# Patient Record
Sex: Female | Born: 1955
Health system: Southern US, Community
[De-identification: ages and names within clinical notes are randomized; demographics above are authoritative.]

## PROBLEM LIST (undated history)

## (undated) DIAGNOSIS — I10 Essential (primary) hypertension: Secondary | ICD-10-CM

## (undated) DIAGNOSIS — K8020 Calculus of gallbladder without cholecystitis without obstruction: Secondary | ICD-10-CM

## (undated) DIAGNOSIS — K621 Rectal polyp: Secondary | ICD-10-CM

## (undated) DIAGNOSIS — E039 Hypothyroidism, unspecified: Secondary | ICD-10-CM

## (undated) DIAGNOSIS — R911 Solitary pulmonary nodule: Secondary | ICD-10-CM

## (undated) DIAGNOSIS — E669 Obesity, unspecified: Secondary | ICD-10-CM

## (undated) DIAGNOSIS — M545 Low back pain, unspecified: Secondary | ICD-10-CM

## (undated) DIAGNOSIS — E559 Vitamin D deficiency, unspecified: Secondary | ICD-10-CM

## (undated) DIAGNOSIS — Z87442 Personal history of urinary calculi: Secondary | ICD-10-CM

## (undated) DIAGNOSIS — F419 Anxiety disorder, unspecified: Secondary | ICD-10-CM

## (undated) DIAGNOSIS — N200 Calculus of kidney: Secondary | ICD-10-CM

## (undated) HISTORY — DX: Essential (primary) hypertension: I10

## (undated) HISTORY — PX: DILATION AND CURETTAGE OF UTERUS: SHX78

## (undated) HISTORY — DX: Calculus of gallbladder without cholecystitis without obstruction: K80.20

## (undated) HISTORY — DX: Low back pain: M54.5

## (undated) HISTORY — DX: Obesity, unspecified: E66.9

## (undated) HISTORY — DX: Solitary pulmonary nodule: R91.1

## (undated) HISTORY — PX: APPENDECTOMY: SHX54

## (undated) HISTORY — DX: Low back pain, unspecified: M54.50

## (undated) HISTORY — PX: LIPOMA EXCISION: SHX5283

## (undated) HISTORY — DX: Rectal polyp: K62.1

## (undated) HISTORY — DX: Vitamin D deficiency, unspecified: E55.9

## (undated) HISTORY — DX: Anxiety disorder, unspecified: F41.9

## (undated) HISTORY — DX: Hypothyroidism, unspecified: E03.9

## (undated) HISTORY — DX: Calculus of kidney: N20.0

---

## 2007-12-02 ENCOUNTER — Ambulatory Visit: Payer: Self-pay | Admitting: Internal Medicine

## 2007-12-27 ENCOUNTER — Ambulatory Visit: Payer: Self-pay | Admitting: Internal Medicine

## 2008-03-11 ENCOUNTER — Ambulatory Visit: Payer: Self-pay | Admitting: Internal Medicine

## 2008-03-20 ENCOUNTER — Ambulatory Visit: Payer: Self-pay | Admitting: Gastroenterology

## 2009-02-23 LAB — HM COLONOSCOPY

## 2010-08-14 ENCOUNTER — Other Ambulatory Visit: Payer: Self-pay

## 2010-10-27 ENCOUNTER — Other Ambulatory Visit: Payer: Self-pay | Admitting: Internal Medicine

## 2010-11-11 ENCOUNTER — Ambulatory Visit: Payer: Self-pay | Admitting: Internal Medicine

## 2010-11-23 ENCOUNTER — Other Ambulatory Visit: Payer: Self-pay | Admitting: Internal Medicine

## 2010-12-23 ENCOUNTER — Other Ambulatory Visit: Payer: Self-pay | Admitting: Internal Medicine

## 2011-01-25 ENCOUNTER — Other Ambulatory Visit: Payer: Self-pay | Admitting: Internal Medicine

## 2011-03-19 ENCOUNTER — Ambulatory Visit: Payer: Self-pay | Admitting: Internal Medicine

## 2011-11-01 ENCOUNTER — Ambulatory Visit: Payer: Self-pay | Admitting: Internal Medicine

## 2011-11-19 ENCOUNTER — Ambulatory Visit (INDEPENDENT_AMBULATORY_CARE_PROVIDER_SITE_OTHER): Payer: BC Managed Care – PPO | Admitting: Internal Medicine

## 2011-11-19 ENCOUNTER — Encounter: Payer: Self-pay | Admitting: Internal Medicine

## 2011-11-19 VITALS — BP 136/96 | HR 81 | Temp 98.0°F | Wt 238.0 lb

## 2011-11-19 DIAGNOSIS — E039 Hypothyroidism, unspecified: Secondary | ICD-10-CM

## 2011-11-19 DIAGNOSIS — F419 Anxiety disorder, unspecified: Secondary | ICD-10-CM

## 2011-11-19 DIAGNOSIS — R05 Cough: Secondary | ICD-10-CM

## 2011-11-19 DIAGNOSIS — I10 Essential (primary) hypertension: Secondary | ICD-10-CM | POA: Insufficient documentation

## 2011-11-19 DIAGNOSIS — H6690 Otitis media, unspecified, unspecified ear: Secondary | ICD-10-CM

## 2011-11-19 DIAGNOSIS — H6692 Otitis media, unspecified, left ear: Secondary | ICD-10-CM

## 2011-11-19 DIAGNOSIS — F411 Generalized anxiety disorder: Secondary | ICD-10-CM

## 2011-11-19 DIAGNOSIS — R059 Cough, unspecified: Secondary | ICD-10-CM

## 2011-11-19 MED ORDER — ALPRAZOLAM 0.25 MG PO TABS: 0.2500 mg | ORAL_TABLET | Freq: Two times a day (BID) | ORAL | Status: DC | PRN

## 2011-11-19 MED ORDER — GUAIFENESIN-CODEINE 100-10 MG/5ML PO SYRP: 5.0000 mL | ORAL_SOLUTION | Freq: Two times a day (BID) | ORAL | Status: DC | PRN

## 2011-11-19 MED ORDER — LEVOTHYROXINE SODIUM 150 MCG PO TABS: 150.0000 ug | ORAL_TABLET | Freq: Every day | ORAL | Status: DC

## 2011-11-19 MED ORDER — LOSARTAN POTASSIUM-HCTZ 50-12.5 MG PO TABS: 1.0000 | ORAL_TABLET | Freq: Every day | ORAL | Status: DC

## 2011-11-19 MED ORDER — AMOXICILLIN-POT CLAVULANATE 875-125 MG PO TABS: 1.0000 | ORAL_TABLET | Freq: Two times a day (BID) | ORAL | Status: AC

## 2011-11-19 NOTE — Progress Notes (Signed)
Subjective:    Patient ID: Barbara Blake, female    DOB: 10/19/1956, 55 y.o.   MRN: [id number]  HPI 55 year old female with a history of hypertension, hypothyroidism, and obesity presents for followup. Her primary concern today is an approximate 2 week history of nasal congestion, fatigue, cough, and ear pain. She notes that she cares for her grandchildren who are frequently sick. She reports that she has been taking over-the-counter DayQuil and NyQuil to help control her symptoms. She denies any fever but has had occasional chills.  She also notes that over the last several months she has been very fatigued. She notes that she was promoted in her job and is working extremely long hours. When she gets home from work, she takes care of her grandchildren. She reports being exhausted. She reports that her new job carries much more responsibility and leads to increased anxiety.  In regards to her hypothyroidism, she reports full compliance with her medication. She has noted some recent weight gain, however she attributes this to lack of exercise and poor dietary choices.  In regards to her hypertension, she reports full compliance with her medicine. She denies any chest pain, palpitations, headache.   Review of Systems  Constitutional: Positive for fatigue. Negative for fever, chills, appetite change and unexpected weight change.  HENT: Positive for congestion, rhinorrhea, postnasal drip and sinus pressure. Negative for sore throat, trouble swallowing, neck pain and voice change.   Eyes: Negative for visual disturbance.  Respiratory: Positive for cough. Negative for shortness of breath, wheezing and stridor.   Cardiovascular: Negative for chest pain, palpitations and leg swelling.  Gastrointestinal: Negative for nausea, vomiting, abdominal pain, diarrhea, constipation, blood in stool, abdominal distention and anal bleeding.  Genitourinary: Negative for dysuria and flank pain.  Musculoskeletal:  Negative for myalgias, arthralgias and gait problem.  Skin: Negative for color change and rash.  Neurological: Negative for dizziness and headaches.  Hematological: Negative for adenopathy. Does not bruise/bleed easily.  Psychiatric/Behavioral: Negative for suicidal ideas, sleep disturbance and dysphoric mood. The patient is not nervous/anxious.    BP 136/96  Pulse 81  Temp(Src) 98 F (36.7 C) (Oral)  Wt 238 lb (107.956 kg)  SpO2 96%     Objective:   Physical Exam  Constitutional: She is oriented to person, place, and time. She appears well-developed and well-nourished. No distress.  HENT:  Head: Normocephalic and atraumatic.  Right Ear: External ear normal. Tympanic membrane is not erythematous. A middle ear effusion is present.  Left Ear: External ear normal. Tympanic membrane is erythematous. A middle ear effusion is present.  Nose: Nose normal.  Mouth/Throat: Oropharynx is clear and moist. No oropharyngeal exudate.  Eyes: Conjunctivae are normal. Pupils are equal, round, and reactive to light. Right eye exhibits no discharge. Left eye exhibits no discharge. No scleral icterus.  Neck: Normal range of motion. Neck supple. No tracheal deviation present. No thyromegaly present.  Cardiovascular: Normal rate, regular rhythm, normal heart sounds and intact distal pulses.  Exam reveals no gallop and no friction rub.   No murmur heard. Pulmonary/Chest: Effort normal and breath sounds normal. No respiratory distress. She has no wheezes. She has no rales. She exhibits no tenderness.  Musculoskeletal: Normal range of motion. She exhibits no edema and no tenderness.  Lymphadenopathy:    She has no cervical adenopathy.  Neurological: She is alert and oriented to person, place, and time. No cranial nerve deficit. She exhibits normal muscle tone. Coordination normal.  Skin: Skin is warm and dry. No  rash noted. She is not diaphoretic. No erythema. No pallor.  Psychiatric: She has a normal mood  and affect. Her behavior is normal. Judgment and thought content normal.          Assessment & Plan:  1. Left otitis media - exam is consistent with left otitis media. Will treat with a ten-day course of Augmentin. She will continue to use DayQuil and NyQuil as needed to control symptoms. She will use ibuprofen as needed for pain. She will followup in one month. She will come back sooner if symptoms are not improving.  2. Hypertension - blood pressure is well-controlled on current medication. We'll check renal function with labs. She will followup in one month.  3. Hypothyroidism -will continue Synthroid. Will check TSH with labs.  4. Fatigue - secondary to work schedule and increased responsibilities at home. Encouraged her to take time for herself. Will followup in one month.

## 2011-11-20 ENCOUNTER — Other Ambulatory Visit: Payer: Self-pay | Admitting: Internal Medicine

## 2011-11-23 ENCOUNTER — Encounter: Payer: Self-pay | Admitting: *Deleted

## 2011-12-24 ENCOUNTER — Ambulatory Visit: Payer: BC Managed Care – PPO | Admitting: Internal Medicine

## 2012-02-16 ENCOUNTER — Encounter: Payer: Self-pay | Admitting: Internal Medicine

## 2012-03-28 ENCOUNTER — Telehealth: Payer: Self-pay | Admitting: Internal Medicine

## 2012-03-28 NOTE — Telephone Encounter (Signed)
[phone] Pt wanted to get lab results from dec. Pt has appointment 4/17 @ 2:30

## 2012-03-29 ENCOUNTER — Ambulatory Visit: Payer: BC Managed Care – PPO | Admitting: Internal Medicine

## 2012-03-29 NOTE — Telephone Encounter (Signed)
Left VM for pt to call office back. Unsure when and where she had her labs.

## 2012-03-29 NOTE — Telephone Encounter (Signed)
I spoke w/pt. She states that she had her labs at [facility] in December. I called the lab and they will be faxing over results so we can have them for the pt's apt today.

## 2012-04-05 ENCOUNTER — Encounter: Payer: Self-pay | Admitting: Internal Medicine

## 2012-04-19 ENCOUNTER — Ambulatory Visit: Payer: BC Managed Care – PPO | Admitting: Internal Medicine

## 2012-04-24 ENCOUNTER — Encounter: Payer: Self-pay | Admitting: Internal Medicine

## 2012-04-24 ENCOUNTER — Other Ambulatory Visit: Payer: Self-pay | Admitting: Internal Medicine

## 2012-04-24 ENCOUNTER — Ambulatory Visit (INDEPENDENT_AMBULATORY_CARE_PROVIDER_SITE_OTHER): Payer: BC Managed Care – PPO | Admitting: Internal Medicine

## 2012-04-24 VITALS — BP 167/96 | HR 70 | Temp 97.8°F | Resp 16 | Wt 239.2 lb

## 2012-04-24 DIAGNOSIS — I10 Essential (primary) hypertension: Secondary | ICD-10-CM

## 2012-04-24 DIAGNOSIS — H6690 Otitis media, unspecified, unspecified ear: Secondary | ICD-10-CM

## 2012-04-24 DIAGNOSIS — M25519 Pain in unspecified shoulder: Secondary | ICD-10-CM

## 2012-04-24 DIAGNOSIS — H6691 Otitis media, unspecified, right ear: Secondary | ICD-10-CM

## 2012-04-24 DIAGNOSIS — Z1239 Encounter for other screening for malignant neoplasm of breast: Secondary | ICD-10-CM

## 2012-04-24 DIAGNOSIS — E039 Hypothyroidism, unspecified: Secondary | ICD-10-CM

## 2012-04-24 MED ORDER — LEVOFLOXACIN 750 MG PO TABS: 750.0000 mg | ORAL_TABLET | Freq: Every day | ORAL | Status: AC

## 2012-04-24 NOTE — Assessment & Plan Note (Signed)
Blood pressure is markedly elevated today, however patient is in significant pain secondary to right ear infection. Will continue current medications. Will check renal function with labs. We'll have her followup in 2-3 weeks for recheck.

## 2012-04-24 NOTE — Progress Notes (Signed)
Subjective:    Patient ID: Barbara Blake, female    DOB: 06/05/1956, 56 y.o.   MRN: [id number]  HPI 56 year old female with history of hypothyroidism and obesity presents for followup. She has several concerns today. First, she notes that approximately 2-3 weeks ago she had an episode of severe shoulder and upper arm pain bilaterally. This is described as a diffuse muscle ache or burning pain. There was no noted injury. The pain resolved after several days without any intervention.  She also reports recent history of right sided ear infection. She reports that she was diagnosed with both internal and external ear infections. She has been treated with oral amoxicillin and topical Ciprodex. She reports that her urine is very painful. She also reports some pain across her right temple and right jaw. She has not had fever or chills. She has not had nasal congestion. She has not recently been swimming.  Outpatient Encounter Prescriptions as of 04/24/2012  Medication Sig Dispense Refill  . ALPRAZolam (XANAX) 0.25 MG tablet Take 1 tablet (0.25 mg total) by mouth 2 (two) times daily as needed.  180 tablet  1  . Calcium Carbonate-Vitamin D (CALCIUM + D PO) Take by mouth daily.        . ciprofloxacin-dexamethasone (CIPRODEX) otic suspension Place 4 drops in ear(s) 2 (two) times daily.      Marland Kitchen guaiFENesin-codeine (ROBITUSSIN AC) 100-10 MG/5ML syrup Take 5 mLs by mouth 2 (two) times daily as needed for cough.  240 mL  0  . levothyroxine (SYNTHROID, LEVOTHROID) 150 MCG tablet Take 1 tablet (150 mcg total) by mouth daily.  90 tablet  4  . losartan-hydrochlorothiazide (HYZAAR) 50-12.5 MG per tablet Take 1 tablet by mouth daily.  90 tablet  4  . Multiple Vitamins-Minerals (MULTIVITAMIN WITH MINERALS) tablet Take 1 tablet by mouth daily.        Marland Kitchen DISCONTD: amoxicillin (AMOXIL) 500 MG capsule Take 1,000 mg by mouth 2 (two) times daily.      Marland Kitchen levofloxacin (LEVAQUIN) 750 MG tablet Take 1 tablet (750 mg total) by mouth  daily.  10 tablet  0   BP 167/96  Pulse 70  Temp(Src) 97.8 F (36.6 C) (Oral)  Resp 16  Wt 239 lb 4 oz (108.523 kg)  SpO2 98%  Review of Systems  Constitutional: Negative for fever, chills, appetite change, fatigue and unexpected weight change.  HENT: Positive for ear pain and ear discharge. Negative for hearing loss, congestion, sore throat, trouble swallowing, neck pain, voice change and sinus pressure.   Eyes: Negative for visual disturbance.  Respiratory: Negative for cough, shortness of breath, wheezing and stridor.   Cardiovascular: Negative for chest pain, palpitations and leg swelling.  Gastrointestinal: Negative for nausea, vomiting, abdominal pain, diarrhea, constipation, blood in stool, abdominal distention and anal bleeding.  Genitourinary: Negative for dysuria and flank pain.  Musculoskeletal: Positive for myalgias and arthralgias. Negative for gait problem.  Skin: Negative for color change and rash.  Neurological: Negative for dizziness and headaches.  Hematological: Negative for adenopathy. Does not bruise/bleed easily.  Psychiatric/Behavioral: Negative for suicidal ideas, sleep disturbance and dysphoric mood. The patient is not nervous/anxious.        Objective:   Physical Exam  Constitutional: She is oriented to person, place, and time. She appears well-developed and well-nourished. No distress.  HENT:  Head: Normocephalic and atraumatic.  Nose: Nose normal.  Mouth/Throat: Oropharynx is clear and moist. No oropharyngeal exudate.       Unable to visualize right TM because  of edema in right ear canal with mild erythema and purulent exudate.  Eyes: Conjunctivae are normal. Pupils are equal, round, and reactive to light. Right eye exhibits no discharge. Left eye exhibits no discharge. No scleral icterus.  Neck: Normal range of motion. Neck supple. No tracheal deviation present. No thyromegaly present.  Cardiovascular: Normal rate, regular rhythm, normal heart sounds  and intact distal pulses.  Exam reveals no gallop and no friction rub.   No murmur heard. Pulmonary/Chest: Effort normal and breath sounds normal. No respiratory distress. She has no wheezes. She has no rales. She exhibits no tenderness.  Musculoskeletal: Normal range of motion. She exhibits no edema and no tenderness.  Lymphadenopathy:    She has no cervical adenopathy.  Neurological: She is alert and oriented to person, place, and time. No cranial nerve deficit. She exhibits normal muscle tone. Coordination normal.  Skin: Skin is warm and dry. No rash noted. She is not diaphoretic. No erythema. No pallor.  Psychiatric: She has a normal mood and affect. Her behavior is normal. Judgment and thought content normal.          Assessment & Plan:

## 2012-04-24 NOTE — Assessment & Plan Note (Signed)
Patient appears to have a right otitis externa and possibly right otitis media. Will change amoxicillin to Levaquin. We'll continue Ciprodex. Will set up referral to ENT for better visualization of the ear canal and tympanic membrane.

## 2012-04-24 NOTE — Assessment & Plan Note (Signed)
Patient's description of bilateral shoulder pain seems most consistent with polymyalgia rheumatica. Question if she may now have temporal arteritis that she has had some episodes of blurred vision and right temporal pain. Will check CBC, ESR, CRP, CMP, ANA with labs. Will continue to monitor for recurrence of pain. Exam is normal today. Followup in 3 weeks.

## 2012-04-24 NOTE — Assessment & Plan Note (Signed)
Thyroid function from December 2012 was normal. Will continue Synthroid at current dose. Plan to repeat TSH once yearly.

## 2012-04-25 LAB — C-REACTIVE PROTEIN: CRP: 0.99 mg/dL — ABNORMAL HIGH (ref <0.60)

## 2012-04-25 LAB — COMPREHENSIVE METABOLIC PANEL
Albumin: 4 g/dL (ref 3.5–5.2)
Alkaline Phosphatase: 91 U/L (ref 39–117)
BUN: 13 mg/dL (ref 6–23)
Calcium: 10.3 mg/dL (ref 8.4–10.5)
Glucose, Bld: 106 mg/dL — ABNORMAL HIGH (ref 70–99)
Potassium: 4.3 mEq/L (ref 3.5–5.3)

## 2012-04-25 LAB — ANA: Anti Nuclear Antibody(ANA): NEGATIVE

## 2012-04-25 NOTE — Progress Notes (Signed)
Addended by: Melody Comas L on: 04/25/2012 10:50 AM   Modules accepted: Orders

## 2012-04-25 NOTE — Progress Notes (Signed)
Addended by: Melody Comas L on: 04/25/2012 11:50 AM   Modules accepted: Orders

## 2012-04-26 ENCOUNTER — Other Ambulatory Visit: Payer: Self-pay | Admitting: Internal Medicine

## 2012-04-26 DIAGNOSIS — I10 Essential (primary) hypertension: Secondary | ICD-10-CM

## 2012-04-26 LAB — CBC WITH DIFFERENTIAL/PLATELET
Basophils Relative: 1 % (ref 0–1)
Eosinophils Absolute: 0.3 10*3/uL (ref 0.0–0.7)
Lymphocytes Relative: 38 % (ref 12–46)
Lymphs Abs: 2.9 10*3/uL (ref 0.7–4.0)
MCV: 92.3 fL (ref 78.0–100.0)
Neutrophils Relative %: 51 % (ref 43–77)
Platelets: 256 10*3/uL (ref 150–400)
RBC: 4.8 MIL/uL (ref 3.87–5.11)
RDW: 13.7 % (ref 11.5–15.5)
WBC: 7.6 10*3/uL (ref 4.0–10.5)

## 2012-04-26 LAB — SEDIMENTATION RATE

## 2012-05-04 ENCOUNTER — Ambulatory Visit: Payer: Self-pay | Admitting: Internal Medicine

## 2012-05-12 ENCOUNTER — Ambulatory Visit: Payer: BC Managed Care – PPO | Admitting: Internal Medicine

## 2012-05-17 ENCOUNTER — Encounter: Payer: Self-pay | Admitting: Internal Medicine

## 2012-05-18 ENCOUNTER — Encounter: Payer: Self-pay | Admitting: Internal Medicine

## 2012-05-19 ENCOUNTER — Encounter: Payer: Self-pay | Admitting: Internal Medicine

## 2012-05-19 ENCOUNTER — Ambulatory Visit (INDEPENDENT_AMBULATORY_CARE_PROVIDER_SITE_OTHER): Payer: BC Managed Care – PPO | Admitting: Internal Medicine

## 2012-05-19 VITALS — BP 122/86 | HR 75 | Temp 98.2°F | Resp 16 | Wt 236.2 lb

## 2012-05-19 DIAGNOSIS — I10 Essential (primary) hypertension: Secondary | ICD-10-CM

## 2012-05-19 DIAGNOSIS — M25519 Pain in unspecified shoulder: Secondary | ICD-10-CM

## 2012-05-19 DIAGNOSIS — M2550 Pain in unspecified joint: Secondary | ICD-10-CM

## 2012-05-19 NOTE — Assessment & Plan Note (Signed)
Patient describes history of arthralgia and occasional swelling in her hands and arms. Swelling is worse in the morning and improves in the afternoon. Suggestive of rheumatoid arthritis. Recent laboratory evaluation showed a slightly elevated CRP, but normal ESR and negative ANA. Will set up evaluation with rheumatology. She will call back with name of provider that she would like to see.

## 2012-05-19 NOTE — Assessment & Plan Note (Signed)
Symptoms have now resolved. Will continue to monitor.

## 2012-05-19 NOTE — Progress Notes (Signed)
Subjective:    Patient ID: Barbara Blake, female    DOB: Apr 08, 1956, 56 y.o.   MRN: [id number]  HPI 56 year old female with history of obesity, hypertension, and recent episode of right shoulder pain presents for followup. She reports that her shoulder pain has improved. However, she reports diffuse arthralgias in her hands and arms. She reports that this is typically worse in the morning and improves throughout the day. However, she did have an episode where she spent extensive time cleaning her back porch and then subsequently had worsening swelling and pain in her fingers and arms. This pain and swelling resolved with the use of ibuprofen. We reviewed recent labs which showed slightly elevated CRP, but normal ESR and ANA.  In regards to her hypertension, she reports compliance with her medications. She has not been regularly checking her blood pressure. She denies any chest pain, headache, palpitation.  Outpatient Encounter Prescriptions as of 05/19/2012  Medication Sig Dispense Refill  . ALPRAZolam (XANAX) 0.25 MG tablet Take 1 tablet (0.25 mg total) by mouth 2 (two) times daily as needed.  180 tablet  1  . Calcium Carbonate-Vitamin D (CALCIUM + D PO) Take by mouth daily.        Marland Kitchen levothyroxine (SYNTHROID, LEVOTHROID) 150 MCG tablet Take 1 tablet (150 mcg total) by mouth daily.  90 tablet  4  . losartan-hydrochlorothiazide (HYZAAR) 50-12.5 MG per tablet Take 1 tablet by mouth daily.  90 tablet  4  . Multiple Vitamins-Minerals (MULTIVITAMIN WITH MINERALS) tablet Take 1 tablet by mouth daily.          Review of Systems  Constitutional: Negative for fever, chills, appetite change, fatigue and unexpected weight change.  HENT: Negative for neck pain.   Eyes: Negative for visual disturbance.  Respiratory: Negative for cough, shortness of breath, wheezing and stridor.   Cardiovascular: Negative for chest pain, palpitations and leg swelling.  Gastrointestinal: Negative for abdominal pain.    Genitourinary: Negative for dysuria and flank pain.  Musculoskeletal: Positive for myalgias, joint swelling and arthralgias. Negative for gait problem.  Skin: Negative for color change and rash.  Neurological: Negative for dizziness and headaches.  Hematological: Negative for adenopathy. Does not bruise/bleed easily.  Psychiatric/Behavioral: Negative for suicidal ideas, sleep disturbance and dysphoric mood. The patient is not nervous/anxious.    BP 122/86  Pulse 75  Temp(Src) 98.2 F (36.8 C) (Oral)  Resp 16  Wt 236 lb 4 oz (107.162 kg)  SpO2 97%     Objective:   Physical Exam  Constitutional: She is oriented to person, place, and time. She appears well-developed and well-nourished. No distress.  HENT:  Head: Normocephalic and atraumatic.  Right Ear: External ear normal.  Left Ear: External ear normal.  Nose: Nose normal.  Mouth/Throat: Oropharynx is clear and moist. No oropharyngeal exudate.  Eyes: Conjunctivae are normal. Pupils are equal, round, and reactive to light. Right eye exhibits no discharge. Left eye exhibits no discharge. No scleral icterus.  Neck: Normal range of motion. Neck supple. No tracheal deviation present. No thyromegaly present.  Cardiovascular: Normal rate, regular rhythm, normal heart sounds and intact distal pulses.  Exam reveals no gallop and no friction rub.   No murmur heard. Pulmonary/Chest: Effort normal and breath sounds normal. No respiratory distress. She has no wheezes. She has no rales. She exhibits no tenderness.  Musculoskeletal: Normal range of motion. She exhibits no edema and no tenderness.  Lymphadenopathy:    She has no cervical adenopathy.  Neurological: She is alert and  oriented to person, place, and time. No cranial nerve deficit. She exhibits normal muscle tone. Coordination normal.  Skin: Skin is warm and dry. No rash noted. She is not diaphoretic. No erythema. No pallor.  Psychiatric: She has a normal mood and affect. Her behavior  is normal. Judgment and thought content normal.          Assessment & Plan:

## 2012-05-19 NOTE — Assessment & Plan Note (Signed)
Blood pressure markedly improved today. Will continue to monitor.

## 2012-07-24 ENCOUNTER — Encounter: Payer: BC Managed Care – PPO | Admitting: Internal Medicine

## 2013-02-20 ENCOUNTER — Encounter: Payer: BC Managed Care – PPO | Admitting: Internal Medicine

## 2013-02-23 ENCOUNTER — Encounter: Payer: Self-pay | Admitting: Internal Medicine

## 2013-02-23 ENCOUNTER — Ambulatory Visit (INDEPENDENT_AMBULATORY_CARE_PROVIDER_SITE_OTHER): Payer: BC Managed Care – PPO | Admitting: Internal Medicine

## 2013-02-23 ENCOUNTER — Encounter: Payer: BC Managed Care – PPO | Admitting: Internal Medicine

## 2013-02-23 VITALS — BP 144/90 | HR 75 | Temp 98.0°F | Ht 61.25 in | Wt 238.0 lb

## 2013-02-23 DIAGNOSIS — M25561 Pain in right knee: Secondary | ICD-10-CM | POA: Insufficient documentation

## 2013-02-23 DIAGNOSIS — R053 Chronic cough: Secondary | ICD-10-CM | POA: Insufficient documentation

## 2013-02-23 DIAGNOSIS — R05 Cough: Secondary | ICD-10-CM

## 2013-02-23 DIAGNOSIS — R059 Cough, unspecified: Secondary | ICD-10-CM

## 2013-02-23 DIAGNOSIS — D1720 Benign lipomatous neoplasm of skin and subcutaneous tissue of unspecified limb: Secondary | ICD-10-CM

## 2013-02-23 DIAGNOSIS — F411 Generalized anxiety disorder: Secondary | ICD-10-CM

## 2013-02-23 DIAGNOSIS — I10 Essential (primary) hypertension: Secondary | ICD-10-CM

## 2013-02-23 DIAGNOSIS — Z1211 Encounter for screening for malignant neoplasm of colon: Secondary | ICD-10-CM

## 2013-02-23 DIAGNOSIS — M25562 Pain in left knee: Secondary | ICD-10-CM

## 2013-02-23 DIAGNOSIS — F419 Anxiety disorder, unspecified: Secondary | ICD-10-CM

## 2013-02-23 DIAGNOSIS — E039 Hypothyroidism, unspecified: Secondary | ICD-10-CM

## 2013-02-23 DIAGNOSIS — M25569 Pain in unspecified knee: Secondary | ICD-10-CM

## 2013-02-23 DIAGNOSIS — D1779 Benign lipomatous neoplasm of other sites: Secondary | ICD-10-CM

## 2013-02-23 LAB — COMPREHENSIVE METABOLIC PANEL
BUN: 17 mg/dL (ref 6–23)
CO2: 25 mEq/L (ref 19–32)
Creat: 0.9 mg/dL (ref 0.50–1.10)
Glucose, Bld: 91 mg/dL (ref 70–99)
Total Bilirubin: 1.5 mg/dL — ABNORMAL HIGH (ref 0.3–1.2)

## 2013-02-23 LAB — CBC WITH DIFFERENTIAL/PLATELET
Basophils Absolute: 0.1 10*3/uL (ref 0.0–0.1)
Basophils Relative: 1 % (ref 0–1)
Eosinophils Relative: 1 % (ref 0–5)
HCT: 42.3 % (ref 36.0–46.0)
Hemoglobin: 14.6 g/dL (ref 12.0–15.0)
Lymphocytes Relative: 29 % (ref 12–46)
MCHC: 34.5 g/dL (ref 30.0–36.0)
MCV: 85.8 fL (ref 78.0–100.0)
Monocytes Absolute: 0.4 10*3/uL (ref 0.1–1.0)
Monocytes Relative: 6 % (ref 3–12)
RDW: 14.1 % (ref 11.5–15.5)

## 2013-02-23 LAB — LIPID PANEL
Cholesterol: 191 mg/dL (ref 0–200)
HDL: 52 mg/dL (ref >39)
Triglycerides: 108 mg/dL (ref <150)

## 2013-02-23 LAB — T4, FREE: Free T4: 1.59 ng/dL (ref 0.80–1.80)

## 2013-02-23 LAB — TSH: TSH: 0.534 u[IU]/mL (ref 0.350–4.500)

## 2013-02-23 MED ORDER — MELOXICAM 15 MG PO TABS: 15.0000 mg | ORAL_TABLET | Freq: Every day | ORAL | Status: DC

## 2013-02-23 MED ORDER — ALPRAZOLAM 0.25 MG PO TABS: 0.2500 mg | ORAL_TABLET | Freq: Two times a day (BID) | ORAL | Status: DC | PRN

## 2013-02-23 MED ORDER — LOSARTAN POTASSIUM-HCTZ 50-12.5 MG PO TABS: 1.0000 | ORAL_TABLET | Freq: Every day | ORAL | Status: DC

## 2013-02-23 MED ORDER — PANTOPRAZOLE SODIUM 40 MG PO TBEC: 40.0000 mg | DELAYED_RELEASE_TABLET | Freq: Every day | ORAL | Status: DC

## 2013-02-23 MED ORDER — LEVOTHYROXINE SODIUM 150 MCG PO TABS: 150.0000 ug | ORAL_TABLET | Freq: Every day | ORAL | Status: DC

## 2013-02-23 NOTE — Assessment & Plan Note (Signed)
Exam consistent with lipomas bilateral anterior thighs. Discussed referral to surgery for evaluation and resection, however pt would like to hold off for now.

## 2013-02-23 NOTE — Assessment & Plan Note (Signed)
Bilateral knee pain is most consistent with osteoarthritis likely exacerbated by obesity. We discussed setting up orthopedic evaluation and getting plain films of her knees. Will proceed with plain film evaluation to her work. Depending on results, plan to set up evaluation with ortho, Dr. Martha Clan. Will start meloxicam 15mg  daily. Follow up 4 weeks.

## 2013-02-23 NOTE — Assessment & Plan Note (Signed)
BP Readings from Last 3 Encounters:  02/23/13 144/90  05/19/12 122/86  04/24/12 167/96   Blood pressure slightly elevated today but patient reports has been well controlled when checked at her work. Will continue losartan-HCTZ. Will check renal function and urine micro-albumen with labs today.

## 2013-02-23 NOTE — Assessment & Plan Note (Signed)
Will check TSH and T4 with labs today. Continue Synthroid.

## 2013-02-23 NOTE — Assessment & Plan Note (Signed)
Chronic cough over last several months. Exam normal today. Symptoms are most consistent with acid reflux. Will set up ENT evaluation for tract visualization of vocal cords and posterior pharynx. Will start pantoprazole. Followup 4 weeks.

## 2013-02-23 NOTE — Progress Notes (Signed)
Subjective:    Patient ID: Barbara Blake, female    DOB: Jan 13, 1956, 57 y.o.   MRN: [id number]  HPI 57 year old female with history of hypothyroidism, hypertension, obesity presents for followup. She has several concerns today.  First, she notes a 3 to four-month history of intermittent dry cough. This occurs every day throughout the day. She denies any nasal drainage, sneezing, shortness of breath, chest pain, fever or chills. She is not a smoker. She has tried using cough drops and over-the-counter cough suppressants with no improvement.  Second, she is concerned about gradually worsening bilateral knee pain. Pain is described as aching it is made worse with prolonged standing. Her husband often notes cracking sounds from her knees. She occasionally has swelling in her knees. She has taken over-the-counter medications such as ibuprofen with no improvement.  She is also concerned today about bilateral subcutaneous nodules over her thighs. She reports she has a history of multiple lipomas in the past and is status post resection of a lipoma in her back. The lesions on her thighs are occasionally painful. Did not seem to be changing in size.  Outpatient Encounter Prescriptions as of 02/23/2013  Medication Sig Dispense Refill  . ALPRAZolam (XANAX) 0.25 MG tablet Take 1 tablet (0.25 mg total) by mouth 2 (two) times daily as needed.  180 tablet  1  . Calcium Carbonate-Vitamin D (CALCIUM + D PO) Take by mouth daily.        Marland Kitchen levothyroxine (SYNTHROID, LEVOTHROID) 150 MCG tablet Take 1 tablet (150 mcg total) by mouth daily.  90 tablet  4  . losartan-hydrochlorothiazide (HYZAAR) 50-12.5 MG per tablet Take 1 tablet by mouth daily.  90 tablet  4  . Multiple Vitamins-Minerals (MULTIVITAMIN WITH MINERALS) tablet Take 1 tablet by mouth daily.        . meloxicam (MOBIC) 15 MG tablet Take 1 tablet (15 mg total) by mouth daily.  30 tablet  0  . pantoprazole (PROTONIX) 40 MG tablet Take 1 tablet (40 mg total) by  mouth daily.  30 tablet  3   No facility-administered encounter medications on file as of 02/23/2013.   BP 144/90  Pulse 75  Temp(Src) 98 F (36.7 C) (Oral)  Ht 5' 1.25" (1.556 m)  Wt 238 lb (107.956 kg)  BMI 44.59 kg/m2  SpO2 96%  Review of Systems  Constitutional: Negative for fever, chills, appetite change, fatigue and unexpected weight change.  HENT: Negative for ear pain, congestion, sore throat, trouble swallowing, neck pain, voice change and sinus pressure.   Eyes: Negative for visual disturbance.  Respiratory: Positive for cough. Negative for shortness of breath, wheezing and stridor.   Cardiovascular: Negative for chest pain, palpitations and leg swelling.  Gastrointestinal: Negative for nausea, vomiting, abdominal pain, diarrhea, constipation, blood in stool, abdominal distention and anal bleeding.  Genitourinary: Negative for dysuria and flank pain.  Musculoskeletal: Positive for myalgias, joint swelling and arthralgias. Negative for gait problem.  Skin: Negative for color change and rash.  Neurological: Negative for dizziness and headaches.  Hematological: Negative for adenopathy. Does not bruise/bleed easily.  Psychiatric/Behavioral: Negative for suicidal ideas, sleep disturbance and dysphoric mood. The patient is not nervous/anxious.        Objective:   Physical Exam  Constitutional: She is oriented to person, place, and time. She appears well-developed and well-nourished. No distress.  HENT:  Head: Normocephalic and atraumatic.  Right Ear: External ear normal.  Left Ear: External ear normal.  Nose: Nose normal.  Mouth/Throat: Oropharynx is clear and  moist. No oropharyngeal exudate or posterior oropharyngeal erythema.  Eyes: Conjunctivae are normal. Pupils are equal, round, and reactive to light. Right eye exhibits no discharge. Left eye exhibits no discharge. No scleral icterus.  Neck: Normal range of motion. Neck supple. No tracheal deviation present. No  thyromegaly present.  Cardiovascular: Normal rate, regular rhythm, normal heart sounds and intact distal pulses.  Exam reveals no gallop and no friction rub.   No murmur heard. Pulmonary/Chest: Effort normal and breath sounds normal. No accessory muscle usage. Not tachypneic. No respiratory distress. She has no decreased breath sounds. She has no wheezes. She has no rhonchi. She has no rales. She exhibits no tenderness.  Musculoskeletal: Normal range of motion. She exhibits no edema and no tenderness.       Right knee: She exhibits normal range of motion and no swelling.       Left knee: She exhibits normal range of motion and no swelling.  Lymphadenopathy:    She has no cervical adenopathy.  Neurological: She is alert and oriented to person, place, and time. No cranial nerve deficit. She exhibits normal muscle tone. Coordination normal.  Skin: Skin is warm and dry. No rash noted. She is not diaphoretic. No erythema. No pallor.     Psychiatric: She has a normal mood and affect. Her behavior is normal. Judgment and thought content normal.          Assessment & Plan:

## 2013-02-23 NOTE — Assessment & Plan Note (Signed)
Colonoscopy scheduled.

## 2013-02-23 NOTE — Assessment & Plan Note (Signed)
Symptoms well controlled with alprazolam. We'll continue. 

## 2013-02-24 LAB — MICROALBUMIN / CREATININE URINE RATIO: Microalb Creat Ratio: 8.2 mg/g (ref 0.0–30.0)

## 2013-03-05 ENCOUNTER — Telehealth: Payer: Self-pay | Admitting: *Deleted

## 2013-03-05 DIAGNOSIS — R7989 Other specified abnormal findings of blood chemistry: Secondary | ICD-10-CM

## 2013-03-05 NOTE — Telephone Encounter (Signed)
Amber, Can you set this up? 

## 2013-03-05 NOTE — Telephone Encounter (Signed)
Message copied by Theola Sequin on Mon Mar 05, 2013  4:28 PM ------      Message from: Ronna Polio A      Created: Sun Feb 25, 2013  5:36 PM       Labs were normal except for bilirubin which is elevated. I would like to get [place] of liver/pancreas for further evaluation. ------

## 2013-03-05 NOTE — Telephone Encounter (Signed)
Patient is ok with referral

## 2013-03-09 ENCOUNTER — Ambulatory Visit: Payer: Self-pay | Admitting: Internal Medicine

## 2013-03-12 ENCOUNTER — Telehealth: Payer: Self-pay | Admitting: Internal Medicine

## 2013-03-12 NOTE — Telephone Encounter (Signed)
Left message to call back  

## 2013-03-12 NOTE — Telephone Encounter (Signed)
US of the right upper abdomen showed stones in the gallbladder. I would like to set up general surgical evaluation for resection of the gallbladder. Please ask her if she is okay with this and who she would like to see.

## 2013-03-13 NOTE — Telephone Encounter (Signed)
Called and spoke with patient, she was informed of the results but at this time she would like to hold off on the referral. Patient state she is in the middle of changing jobs so will call back at a later date when she is ready for referral.

## 2013-03-13 NOTE — Telephone Encounter (Signed)
Left message to call back on both home and cell phone 

## 2013-03-28 ENCOUNTER — Encounter: Payer: Self-pay | Admitting: Internal Medicine

## 2013-04-20 ENCOUNTER — Encounter: Payer: BC Managed Care – PPO | Admitting: Internal Medicine

## 2013-06-22 ENCOUNTER — Telehealth: Payer: Self-pay | Admitting: Internal Medicine

## 2013-06-22 DIAGNOSIS — E039 Hypothyroidism, unspecified: Secondary | ICD-10-CM

## 2013-06-22 MED ORDER — LEVOTHYROXINE SODIUM 150 MCG PO TABS: 150.0000 ug | ORAL_TABLET | Freq: Every day | ORAL | Status: DC

## 2013-06-22 NOTE — Telephone Encounter (Signed)
Ok to refill medication 

## 2013-06-22 NOTE — Telephone Encounter (Signed)
Rx sent to pharmacy   

## 2013-06-22 NOTE — Addendum Note (Signed)
Addended by: Theola Sequin on: 06/22/2013 01:46 PM   Modules accepted: Orders

## 2013-06-22 NOTE — Telephone Encounter (Signed)
Cancelled last visit with Dr. Dan Humphreys but needs refill for Synthroid 150.  Ran out 4 days ago and needs refill.  Not going to be able to sched appt just yet because just started a new job.  Really needs the refill.  [facility] [address]

## 2013-06-22 NOTE — Telephone Encounter (Signed)
Fine to refill Synthroid daily #90 with 1 refill.

## 2014-01-14 ENCOUNTER — Telehealth: Payer: Self-pay | Admitting: Internal Medicine

## 2014-01-14 DIAGNOSIS — F419 Anxiety disorder, unspecified: Secondary | ICD-10-CM

## 2014-01-14 DIAGNOSIS — I10 Essential (primary) hypertension: Secondary | ICD-10-CM

## 2014-01-14 NOTE — Telephone Encounter (Signed)
Pt called stated she needs to get her rx refilled through [facility]  50-12.5  xanax 0.25   Pt has appointment for cpx 4/7

## 2014-01-18 MED ORDER — ALPRAZOLAM 0.25 MG PO TABS: 0.2500 mg | ORAL_TABLET | Freq: Two times a day (BID) | ORAL | Status: DC | PRN

## 2014-01-18 MED ORDER — LOSARTAN POTASSIUM-HCTZ 50-12.5 MG PO TABS: 1.0000 | ORAL_TABLET | Freq: Every day | ORAL | Status: DC

## 2014-01-18 NOTE — Telephone Encounter (Signed)
Ok to refill??      Xanax 

## 2014-01-18 NOTE — Telephone Encounter (Signed)
Fine to refill 

## 2014-02-13 ENCOUNTER — Ambulatory Visit: Payer: Self-pay | Admitting: Internal Medicine

## 2014-02-13 LAB — HM MAMMOGRAPHY: HM Mammogram: NORMAL

## 2014-03-05 ENCOUNTER — Encounter: Payer: Self-pay | Admitting: Internal Medicine

## 2014-03-08 ENCOUNTER — Encounter: Payer: Self-pay | Admitting: Internal Medicine

## 2014-03-19 ENCOUNTER — Encounter: Payer: BC Managed Care – PPO | Admitting: Internal Medicine

## 2014-04-04 ENCOUNTER — Encounter: Payer: Self-pay | Admitting: Internal Medicine

## 2014-04-04 ENCOUNTER — Ambulatory Visit (INDEPENDENT_AMBULATORY_CARE_PROVIDER_SITE_OTHER): Payer: 59 | Admitting: Internal Medicine

## 2014-04-04 VITALS — BP 160/102 | HR 65 | Temp 98.3°F | Resp 16 | Ht 61.0 in | Wt 229.5 lb

## 2014-04-04 DIAGNOSIS — I10 Essential (primary) hypertension: Secondary | ICD-10-CM

## 2014-04-04 DIAGNOSIS — Z0000 Encounter for general adult medical examination without abnormal findings: Secondary | ICD-10-CM

## 2014-04-04 DIAGNOSIS — M5430 Sciatica, unspecified side: Secondary | ICD-10-CM

## 2014-04-04 DIAGNOSIS — M5432 Sciatica, left side: Secondary | ICD-10-CM

## 2014-04-04 DIAGNOSIS — Z1211 Encounter for screening for malignant neoplasm of colon: Secondary | ICD-10-CM | POA: Insufficient documentation

## 2014-04-04 DIAGNOSIS — E039 Hypothyroidism, unspecified: Secondary | ICD-10-CM

## 2014-04-04 HISTORY — DX: Sciatica, left side: M54.32

## 2014-04-04 LAB — HM PAP SMEAR: HM PAP: NORMAL

## 2014-04-04 MED ORDER — LEVOTHYROXINE SODIUM 150 MCG PO TABS: 150.0000 ug | ORAL_TABLET | Freq: Every day | ORAL | Status: DC

## 2014-04-04 MED ORDER — LOSARTAN POTASSIUM-HCTZ 100-12.5 MG PO TABS: 1.0000 | ORAL_TABLET | Freq: Every day | ORAL | Status: DC

## 2014-04-04 NOTE — Assessment & Plan Note (Signed)
Will check TSH with labs. Restart Levothyroxine.

## 2014-04-04 NOTE — Assessment & Plan Note (Signed)
BP Readings from Last 3 Encounters:  04/04/14 160/102  02/23/13 144/90  05/19/12 122/86   BP elevated today. Encouraged her to consistently take medication. Will check renal function with labs. Follow up 2 weeks.

## 2014-04-04 NOTE — Progress Notes (Signed)
Pre visit review using our clinic review tool, if applicable. No additional management support is needed unless otherwise documented below in the visit note. 

## 2014-04-04 NOTE — Assessment & Plan Note (Signed)
Symptoms most consistent with left sciatica. Will set up PT. Discussed risk of NSAIDS given marked HTN. So, will use Tylenol prn pain.

## 2014-04-04 NOTE — Progress Notes (Signed)
Subjective:    Patient ID: Barbara Blake, female    DOB: 1956/11/22, 58 y.o.   MRN: [id number]  HPI 58YO female presents for annual exam.  Concerned about arthralgia, most prominent in left hip and thigh. Described as excruciating pain in left posterior hip and thigh which radiates down back of leg. Triggered by sitting for prolonged periods. Taking glucosamine with no improvement. Pain improves with walking. No weakness, numbness or swelling in leg.  Also concerned about intermittent red itchy rash over legs and arms. Described as red flat areas which come and go. No clear trigger. Tried applying topical hydrocortisone with some improvement.  HTN - has not been checking BP or taking medication regularly.   Hypothyroidism - Out of Levothyroxine for over 1 month.  Review of Systems  Constitutional: Negative for fever, chills, appetite change, fatigue and unexpected weight change.  HENT: Negative for congestion, ear pain, sinus pressure, sore throat, trouble swallowing and voice change.   Eyes: Negative for visual disturbance.  Respiratory: Negative for cough, shortness of breath, wheezing and stridor.   Cardiovascular: Negative for chest pain, palpitations and leg swelling.  Gastrointestinal: Negative for nausea, vomiting, abdominal pain, diarrhea, constipation, blood in stool, abdominal distention and anal bleeding.  Genitourinary: Negative for dysuria and flank pain.  Musculoskeletal: Positive for arthralgias and myalgias. Negative for gait problem and neck pain.  Skin: Positive for color change and rash.  Neurological: Negative for dizziness and headaches.  Hematological: Negative for adenopathy. Does not bruise/bleed easily.  Psychiatric/Behavioral: Negative for suicidal ideas, sleep disturbance and dysphoric mood. The patient is not nervous/anxious.        Objective:    BP 160/102  Pulse 65  Temp(Src) 98.3 F (36.8 C) (Oral)  Resp 16  Ht 5\' 1"  (1.549 m)  Wt 229 lb 8 oz  (104.101 kg)  BMI 43.39 kg/m2  SpO2 98% Physical Exam  Constitutional: She is oriented to person, place, and time. She appears well-developed and well-nourished. No distress.  HENT:  Head: Normocephalic and atraumatic.  Right Ear: External ear normal.  Left Ear: External ear normal.  Nose: Nose normal.  Mouth/Throat: Oropharynx is clear and moist. No oropharyngeal exudate.  Eyes: Conjunctivae are normal. Pupils are equal, round, and reactive to light. Right eye exhibits no discharge. Left eye exhibits no discharge. No scleral icterus.  Neck: Normal range of motion. Neck supple. No tracheal deviation present. No thyromegaly present.  Cardiovascular: Normal rate, regular rhythm, normal heart sounds and intact distal pulses.  Exam reveals no gallop and no friction rub.   No murmur heard. Pulmonary/Chest: Effort normal and breath sounds normal. No accessory muscle usage. Not tachypneic. No respiratory distress. She has no decreased breath sounds. She has no wheezes. She has no rhonchi. She has no rales. She exhibits no tenderness.  Abdominal: Soft. Bowel sounds are normal. She exhibits no distension and no mass. There is no tenderness. There is no rebound and no guarding.  Genitourinary: Rectum normal, vagina normal and uterus normal. No breast swelling, tenderness, discharge or bleeding. Pelvic exam was performed with patient supine. There is no rash, tenderness or lesion on the right labia. There is no rash, tenderness or lesion on the left labia. Uterus is not enlarged and not tender. Cervix exhibits no motion tenderness, no discharge and no friability. Right adnexum displays no mass, no tenderness and no fullness. Left adnexum displays no mass, no tenderness and no fullness. No erythema or tenderness around the vagina. No vaginal discharge found.  Musculoskeletal:  Normal range of motion. She exhibits no edema and no tenderness.  Lymphadenopathy:    She has no cervical adenopathy.  Neurological:  She is alert and oriented to person, place, and time. No cranial nerve deficit. She exhibits normal muscle tone. Coordination normal.  Skin: Skin is warm and dry. Rash noted. Rash is macular (erythematous patches over arms and legs). She is not diaphoretic. No erythema. No pallor.  Psychiatric: She has a normal mood and affect. Her behavior is normal. Judgment and thought content normal.          Assessment & Plan:   Problem List Items Addressed This Visit   Hypertension      BP Readings from Last 3 Encounters:  04/04/14 160/102  02/23/13 144/90  05/19/12 122/86   BP elevated today. Encouraged her to consistently take medication. Will check renal function with labs. Follow up 2 weeks.    Relevant Medications      LOSARTAN POTASSIUM-HCTZ 100-12.5 MG PO TABS   Other Relevant Orders      EKG 12-Lead (Completed)   Hypothyroidism     Will check TSH with labs. Restart Levothyroxine.     Relevant Medications      levothyroxine (SYNTHROID, LEVOTHROID) tablet   Other Relevant Orders      T4, free      TSH   Left sided sciatica     Symptoms most consistent with left sciatica. Will set up PT. Discussed risk of NSAIDS given marked HTN. So, will use Tylenol prn pain.    Relevant Orders      Ambulatory referral to Physical Therapy   Routine general medical examination at a health care facility - Primary     General medical exam including breast and pelvic exam normal today except as noted. PAP pending. Referral placed for colonoscopy. Mammogram UTD. Immunizations UTD. EKG normal. Will check labs including CBC, CMP, lipids, TSH, VitD. Encouraged healthy diet and exercise. Follow up 2 weeks.    Relevant Orders      CBC with Differential      Comprehensive metabolic panel      Lipid panel      Hemoglobin A1c      Microalbumin / creatinine urine ratio      Vit D  25 hydroxy (rtn osteoporosis monitoring)   Screen for colon cancer   Relevant Orders      Ambulatory referral to  Gastroenterology       Return in about 2 weeks (around 04/18/2014) for Recheck of Blood Pressure.

## 2014-04-04 NOTE — Assessment & Plan Note (Signed)
General medical exam including breast and pelvic exam normal today except as noted. PAP pending. Referral placed for colonoscopy. Mammogram UTD. Immunizations UTD. EKG normal. Will check labs including CBC, CMP, lipids, TSH, VitD. Encouraged healthy diet and exercise. Follow up 2 weeks.

## 2014-04-05 ENCOUNTER — Other Ambulatory Visit: Payer: Self-pay | Admitting: Internal Medicine

## 2014-04-05 ENCOUNTER — Telehealth: Payer: Self-pay | Admitting: Internal Medicine

## 2014-04-05 ENCOUNTER — Other Ambulatory Visit (HOSPITAL_COMMUNITY)
Admission: RE | Admit: 2014-04-05 | Discharge: 2014-04-05 | Disposition: A | Discharge status: Home / Self Care | Payer: 59 | Source: Ambulatory Visit | Attending: Internal Medicine | Admitting: Internal Medicine

## 2014-04-05 DIAGNOSIS — F419 Anxiety disorder, unspecified: Secondary | ICD-10-CM

## 2014-04-05 DIAGNOSIS — Z1151 Encounter for screening for human papillomavirus (HPV): Secondary | ICD-10-CM | POA: Insufficient documentation

## 2014-04-05 DIAGNOSIS — Z01419 Encounter for gynecological examination (general) (routine) without abnormal findings: Secondary | ICD-10-CM | POA: Insufficient documentation

## 2014-04-05 NOTE — Telephone Encounter (Signed)
Relevant patient education assigned to patient using Emmi. ° °

## 2014-04-05 NOTE — Telephone Encounter (Signed)
Pt states she was seen by Dr. Gilford Rile.  States she was to get refills on all medications.  States she received refills on all except for alprazolam.  [facility] employee pharmacy.

## 2014-04-05 NOTE — Addendum Note (Signed)
Addended by: Karlene Einstein D on: 04/05/2014 08:47 AM   Modules accepted: Orders

## 2014-04-08 MED ORDER — ALPRAZOLAM 0.25 MG PO TABS: 0.2500 mg | ORAL_TABLET | Freq: Two times a day (BID) | ORAL | Status: DC | PRN

## 2014-04-08 NOTE — Telephone Encounter (Signed)
Left message for pt to return my call. Pt had Rx refilled 01/18/14 for 3 month supply with 1 refill, so she should have a refill available already. Will need to check with her pharmacy.

## 2014-04-08 NOTE — Telephone Encounter (Signed)
Last refill was sent to [facility], pt states [facility] has no refills available on her Xanax. Ok to send refill?

## 2014-04-10 ENCOUNTER — Encounter: Payer: Self-pay | Admitting: *Deleted

## 2014-04-24 ENCOUNTER — Ambulatory Visit: Payer: 59 | Admitting: Internal Medicine

## 2014-04-24 ENCOUNTER — Other Ambulatory Visit (INDEPENDENT_AMBULATORY_CARE_PROVIDER_SITE_OTHER): Payer: 59

## 2014-04-24 DIAGNOSIS — Z0000 Encounter for general adult medical examination without abnormal findings: Secondary | ICD-10-CM

## 2014-04-24 DIAGNOSIS — E039 Hypothyroidism, unspecified: Secondary | ICD-10-CM

## 2014-04-24 LAB — CBC WITH DIFFERENTIAL/PLATELET
BASOS PCT: 0.9 % (ref 0.0–3.0)
Basophils Absolute: 0 10*3/uL (ref 0.0–0.1)
EOS ABS: 0.1 10*3/uL (ref 0.0–0.7)
EOS PCT: 2.2 % (ref 0.0–5.0)
HCT: 41.6 % (ref 36.0–46.0)
Hemoglobin: 13.9 g/dL (ref 12.0–15.0)
LYMPHS PCT: 29.2 % (ref 12.0–46.0)
Lymphs Abs: 1.6 10*3/uL (ref 0.7–4.0)
MCHC: 33.3 g/dL (ref 30.0–36.0)
MCV: 90.1 fl (ref 78.0–100.0)
Monocytes Absolute: 0.3 10*3/uL (ref 0.1–1.0)
Monocytes Relative: 5.7 % (ref 3.0–12.0)
Neutro Abs: 3.4 10*3/uL (ref 1.4–7.7)
Neutrophils Relative %: 62 % (ref 43.0–77.0)
PLATELETS: 225 10*3/uL (ref 150.0–400.0)
RBC: 4.62 Mil/uL (ref 3.87–5.11)
RDW: 14.7 % (ref 11.5–15.5)
WBC: 5.5 10*3/uL (ref 4.0–10.5)

## 2014-04-24 LAB — COMPREHENSIVE METABOLIC PANEL
ALBUMIN: 3.6 g/dL (ref 3.5–5.2)
ALK PHOS: 74 U/L (ref 39–117)
ALT: 28 U/L (ref 0–35)
AST: 25 U/L (ref 0–37)
BUN: 16 mg/dL (ref 6–23)
CALCIUM: 9.2 mg/dL (ref 8.4–10.5)
CO2: 29 mEq/L (ref 19–32)
Chloride: 107 mEq/L (ref 96–112)
Creatinine, Ser: 0.7 mg/dL (ref 0.4–1.2)
GFR: 94.89 mL/min (ref >60.00)
GLUCOSE: 93 mg/dL (ref 70–99)
POTASSIUM: 3.9 meq/L (ref 3.5–5.1)
SODIUM: 142 meq/L (ref 135–145)
TOTAL PROTEIN: 6.6 g/dL (ref 6.0–8.3)
Total Bilirubin: 1.4 mg/dL — ABNORMAL HIGH (ref 0.2–1.2)

## 2014-04-24 LAB — LIPID PANEL
CHOL/HDL RATIO: 4
Cholesterol: 188 mg/dL (ref 0–200)
HDL: 51.7 mg/dL (ref >39.00)
LDL CALC: 122 mg/dL — AB (ref 0–99)
Triglycerides: 72 mg/dL (ref 0.0–149.0)
VLDL: 14.4 mg/dL (ref 0.0–40.0)

## 2014-04-24 LAB — TSH: TSH: 1.06 u[IU]/mL (ref 0.35–4.50)

## 2014-04-24 LAB — MICROALBUMIN / CREATININE URINE RATIO
CREATININE, U: 194.7 mg/dL
MICROALB UR: 1.3 mg/dL (ref 0.0–1.9)
MICROALB/CREAT RATIO: 0.7 mg/g (ref 0.0–30.0)

## 2014-04-24 LAB — T4, FREE: Free T4: 1.1 ng/dL (ref 0.60–1.60)

## 2014-04-24 LAB — HEMOGLOBIN A1C: Hgb A1c MFr Bld: 5.5 % (ref 4.6–6.5)

## 2014-04-25 ENCOUNTER — Encounter: Payer: Self-pay | Admitting: Internal Medicine

## 2014-04-25 ENCOUNTER — Encounter: Payer: Self-pay | Admitting: *Deleted

## 2014-04-25 ENCOUNTER — Ambulatory Visit (INDEPENDENT_AMBULATORY_CARE_PROVIDER_SITE_OTHER): Payer: 59 | Admitting: Internal Medicine

## 2014-04-25 VITALS — BP 122/86 | HR 73 | Temp 98.2°F | Wt 229.0 lb

## 2014-04-25 DIAGNOSIS — K8020 Calculus of gallbladder without cholecystitis without obstruction: Secondary | ICD-10-CM

## 2014-04-25 DIAGNOSIS — I10 Essential (primary) hypertension: Secondary | ICD-10-CM

## 2014-04-25 DIAGNOSIS — E039 Hypothyroidism, unspecified: Secondary | ICD-10-CM

## 2014-04-25 HISTORY — DX: Calculus of gallbladder without cholecystitis without obstruction: K80.20

## 2014-04-25 LAB — VITAMIN D 25 HYDROXY (VIT D DEFICIENCY, FRACTURES): Vit D, 25-Hydroxy: 29 ng/mL — ABNORMAL LOW (ref 30–89)

## 2014-04-25 NOTE — Patient Instructions (Signed)
Call and let us know which surgeon you prefer to evaluate gallbladder. Ask to speak to [place] or [place].

## 2014-04-25 NOTE — Progress Notes (Signed)
Pre visit review using our clinic review tool, if applicable. No additional management support is needed unless otherwise documented below in the visit note. 

## 2014-04-25 NOTE — Assessment & Plan Note (Signed)
Lab Results  Component Value Date   TSH 1.06 04/24/2014   TSH normal on Levothyroxine. Will continue.

## 2014-04-25 NOTE — Progress Notes (Signed)
Subjective:    Patient ID: Barbara Blake, female    DOB: 1956-12-10, 58 y.o.   MRN: [id number]  HPI 58YO female presents for follow up.  HTN - BP has been better controlled when checked at home or work, but did not bring record today. Compliant with meds.  Cholelithiasis - noted on [place] in 2014. Has not yet followed up with gen surgery for evaluation. Would like to set this up. No current symptoms of nausea or vomiting. Recent bili was 1.4.  Obesity - working with husband on healthier diet and increased physical activity.   Review of Systems  Constitutional: Negative for fever, chills, appetite change, fatigue and unexpected weight change.  HENT: Negative for congestion, ear pain, sinus pressure, sore throat, trouble swallowing and voice change.   Eyes: Negative for visual disturbance.  Respiratory: Negative for cough, shortness of breath, wheezing and stridor.   Cardiovascular: Negative for chest pain, palpitations and leg swelling.  Gastrointestinal: Negative for nausea, vomiting, abdominal pain, diarrhea, constipation, blood in stool, abdominal distention and anal bleeding.  Genitourinary: Negative for dysuria and flank pain.  Musculoskeletal: Negative for arthralgias, gait problem, myalgias and neck pain.  Skin: Negative for color change and rash.  Neurological: Negative for dizziness and headaches.  Hematological: Negative for adenopathy. Does not bruise/bleed easily.  Psychiatric/Behavioral: Negative for suicidal ideas, sleep disturbance and dysphoric mood. The patient is not nervous/anxious.        Objective:    BP 122/86  Pulse 73  Temp(Src) 98.2 F (36.8 C) (Oral)  Wt 229 lb (103.874 kg)  SpO2 97% Physical Exam  Constitutional: She is oriented to person, place, and time. She appears well-developed and well-nourished. No distress.  HENT:  Head: Normocephalic and atraumatic.  Right Ear: External ear normal.  Left Ear: External ear normal.  Nose: Nose normal.    Mouth/Throat: Oropharynx is clear and moist. No oropharyngeal exudate.  Eyes: Conjunctivae are normal. Pupils are equal, round, and reactive to light. Right eye exhibits no discharge. Left eye exhibits no discharge. No scleral icterus.  Neck: Normal range of motion. Neck supple. No tracheal deviation present. No thyromegaly present.  Cardiovascular: Normal rate, regular rhythm, normal heart sounds and intact distal pulses.  Exam reveals no gallop and no friction rub.   No murmur heard. Pulmonary/Chest: Effort normal and breath sounds normal. No accessory muscle usage. Not tachypneic. No respiratory distress. She has no decreased breath sounds. She has no wheezes. She has no rhonchi. She has no rales. She exhibits no tenderness.  Abdominal: Soft. Bowel sounds are normal. She exhibits no distension and no mass. There is no tenderness. There is no rebound and no guarding.  Musculoskeletal: Normal range of motion. She exhibits no edema and no tenderness.  Lymphadenopathy:    She has no cervical adenopathy.  Neurological: She is alert and oriented to person, place, and time. No cranial nerve deficit. She exhibits normal muscle tone. Coordination normal.  Skin: Skin is warm and dry. No rash noted. She is not diaphoretic. No erythema. No pallor.  Psychiatric: She has a normal mood and affect. Her behavior is normal. Judgment and thought content normal.          Assessment & Plan:   Problem List Items Addressed This Visit   Cholelithiasis     Reviewed previous US showing. 1.8cm stone. Currently asymptomatic, however bilirubin slightly elevated on labs. Will set up gen surgery evaluation for cholecystectomy.    Relevant Orders      Ambulatory referral  to General Surgery   Hypertension - Primary      BP Readings from Last 3 Encounters:  04/25/14 122/86  04/04/14 160/102  02/23/13 144/90   BP well controlled on Losartan-HCTZ. Renal function normal.Will continue.    Hypothyroidism       Lab Results  Component Value Date   TSH 1.06 04/24/2014   TSH normal on Levothyroxine. Will continue.    Severe obesity (BMI >= 40)      Wt Readings from Last 3 Encounters:  04/25/14 229 lb (103.874 kg)  04/04/14 229 lb 8 oz (104.101 kg)  02/23/13 238 lb (107.956 kg)   Encouraged healthy diet and exercise with goal of 30min 3x per week with goal of weight loss.        Return in about 6 months (around 10/26/2014).

## 2014-04-25 NOTE — Assessment & Plan Note (Signed)
Reviewed previous US showing. 1.8cm stone. Currently asymptomatic, however bilirubin slightly elevated on labs. Will set up gen surgery evaluation for cholecystectomy.

## 2014-04-25 NOTE — Assessment & Plan Note (Signed)
BP Readings from Last 3 Encounters:  04/25/14 122/86  04/04/14 160/102  02/23/13 144/90   BP well controlled on Losartan-HCTZ. Renal function normal.Will continue.

## 2014-04-25 NOTE — Assessment & Plan Note (Signed)
Wt Readings from Last 3 Encounters:  04/25/14 229 lb (103.874 kg)  04/04/14 229 lb 8 oz (104.101 kg)  02/23/13 238 lb (107.956 kg)   Encouraged healthy diet and exercise with goal of 80min 3x per week with goal of weight loss.

## 2014-04-27 ENCOUNTER — Encounter: Payer: Self-pay | Admitting: Internal Medicine

## 2014-04-30 ENCOUNTER — Encounter: Payer: Self-pay | Admitting: Internal Medicine

## 2014-05-13 ENCOUNTER — Encounter: Payer: Self-pay | Admitting: Internal Medicine

## 2014-06-07 ENCOUNTER — Ambulatory Visit: Payer: Self-pay | Admitting: Gastroenterology

## 2014-06-07 LAB — HM COLONOSCOPY

## 2014-06-12 ENCOUNTER — Encounter: Payer: Self-pay | Admitting: Internal Medicine

## 2014-06-13 ENCOUNTER — Encounter: Payer: Self-pay | Admitting: *Deleted

## 2014-09-26 ENCOUNTER — Telehealth: Payer: Self-pay | Admitting: Internal Medicine

## 2014-09-26 NOTE — Telephone Encounter (Signed)
error 

## 2014-10-07 ENCOUNTER — Other Ambulatory Visit: Payer: Self-pay | Admitting: Internal Medicine

## 2014-10-07 NOTE — Telephone Encounter (Signed)
Last visit 04/25/14, ok refill for Dr. Gilford Rile?

## 2014-10-08 NOTE — Telephone Encounter (Signed)
Ok to refill,  printed rx  

## 2014-10-09 NOTE — Telephone Encounter (Signed)
Refill faxed

## 2014-10-31 ENCOUNTER — Ambulatory Visit: Payer: 59 | Admitting: Internal Medicine

## 2014-12-04 ENCOUNTER — Encounter: Payer: Self-pay | Admitting: Internal Medicine

## 2014-12-04 ENCOUNTER — Ambulatory Visit (INDEPENDENT_AMBULATORY_CARE_PROVIDER_SITE_OTHER): Payer: 59 | Admitting: Internal Medicine

## 2014-12-04 VITALS — BP 130/86 | HR 68 | Temp 98.4°F | Ht 61.0 in | Wt 226.8 lb

## 2014-12-04 DIAGNOSIS — I10 Essential (primary) hypertension: Secondary | ICD-10-CM

## 2014-12-04 DIAGNOSIS — E039 Hypothyroidism, unspecified: Secondary | ICD-10-CM

## 2014-12-04 LAB — COMPREHENSIVE METABOLIC PANEL
ALBUMIN: 3.7 g/dL (ref 3.5–5.2)
ALT: 20 U/L (ref 0–35)
AST: 18 U/L (ref 0–37)
Alkaline Phosphatase: 68 U/L (ref 39–117)
BUN: 14 mg/dL (ref 6–23)
CALCIUM: 8.7 mg/dL (ref 8.4–10.5)
CHLORIDE: 104 meq/L (ref 96–112)
CO2: 27 meq/L (ref 19–32)
Creatinine, Ser: 0.9 mg/dL (ref 0.4–1.2)
GFR: 73.19 mL/min (ref >60.00)
Glucose, Bld: 99 mg/dL (ref 70–99)
POTASSIUM: 3.6 meq/L (ref 3.5–5.1)
Sodium: 138 mEq/L (ref 135–145)
Total Bilirubin: 1.2 mg/dL (ref 0.2–1.2)
Total Protein: 7 g/dL (ref 6.0–8.3)

## 2014-12-04 LAB — TSH: TSH: 6.94 u[IU]/mL — ABNORMAL HIGH (ref 0.35–4.50)

## 2014-12-04 MED ORDER — LEVOTHYROXINE SODIUM 150 MCG PO TABS: 150.0000 ug | ORAL_TABLET | Freq: Every day | ORAL | Status: DC

## 2014-12-04 NOTE — Patient Instructions (Signed)
Labs today or tomorrow.  Follow up in 77months or sooner as needed.

## 2014-12-04 NOTE — Assessment & Plan Note (Signed)
Wt Readings from Last 3 Encounters:  12/04/14 226 lb 12 oz (102.853 kg)  04/25/14 229 lb (103.874 kg)  04/04/14 229 lb 8 oz (104.101 kg)   Body mass index is 42.87 kg/(m^2). Congratulated pt on weight loss. Encouraged continued effort at diet and exercise.

## 2014-12-04 NOTE — Progress Notes (Signed)
Subjective:    Patient ID: Barbara Blake, female    DOB: 01/17/1956, 58 y.o.   MRN: [id number]  HPI 58YO female presents for follow up.  HTN - BP at work varies. Sometimes elevated, 527P systolic. Improved with control of anxiety with alprazolam.  Trying to follow healthier diet with smaller portions. Stays active at work.  Wt Readings from Last 3 Encounters:  12/04/14 226 lb 12 oz (102.853 kg)  04/25/14 229 lb (103.874 kg)  04/04/14 229 lb 8 oz (104.101 kg)   No new concerns today.   Past medical, surgical, family and social history per today's encounter.  Review of Systems  Constitutional: Negative for fever, chills, appetite change, fatigue and unexpected weight change.  Eyes: Negative for visual disturbance.  Respiratory: Negative for shortness of breath.   Cardiovascular: Negative for chest pain and leg swelling.  Gastrointestinal: Negative for nausea, vomiting, abdominal pain, diarrhea and constipation.  Musculoskeletal: Negative for myalgias and arthralgias.  Skin: Negative for color change and rash.  Hematological: Negative for adenopathy. Does not bruise/bleed easily.  Psychiatric/Behavioral: Negative for sleep disturbance and dysphoric mood. The patient is not nervous/anxious.        Objective:    BP 130/86 mmHg  Pulse 68  Temp(Src) 98.4 F (36.9 C) (Oral)  Ht 5\' 1"  (1.549 m)  Wt 226 lb 12 oz (102.853 kg)  BMI 42.87 kg/m2  SpO2 99% Physical Exam  Constitutional: She is oriented to person, place, and time. She appears well-developed and well-nourished. No distress.  HENT:  Head: Normocephalic and atraumatic.  Right Ear: External ear normal.  Left Ear: External ear normal.  Nose: Nose normal.  Mouth/Throat: Oropharynx is clear and moist. No oropharyngeal exudate.  Eyes: Conjunctivae are normal. Pupils are equal, round, and reactive to light. Right eye exhibits no discharge. Left eye exhibits no discharge. No scleral icterus.  Neck: Normal range of  motion. Neck supple. No tracheal deviation present. No thyromegaly present.  Cardiovascular: Normal rate, regular rhythm, normal heart sounds and intact distal pulses.  Exam reveals no gallop and no friction rub.   No murmur heard. Pulmonary/Chest: Effort normal and breath sounds normal. No accessory muscle usage. No tachypnea. No respiratory distress. She has no decreased breath sounds. She has no wheezes. She has no rhonchi. She has no rales. She exhibits no tenderness.  Musculoskeletal: Normal range of motion. She exhibits no edema or tenderness.  Lymphadenopathy:    She has no cervical adenopathy.  Neurological: She is alert and oriented to person, place, and time. No cranial nerve deficit. She exhibits normal muscle tone. Coordination normal.  Skin: Skin is warm and dry. No rash noted. She is not diaphoretic. No erythema. No pallor.  Psychiatric: She has a normal mood and affect. Her behavior is normal. Judgment and thought content normal.          Assessment & Plan:   Problem List Items Addressed This Visit      Unprioritized   Hypertension    BP Readings from Last 3 Encounters:  12/04/14 130/86  04/25/14 122/86  04/04/14 160/102   BP well controlled on current medications. Will check renal function with labs.    Relevant Orders      Comprehensive metabolic panel   Hypothyroidism - Primary    Will check TSH with labs. Continue Levothyroxine.    Relevant Medications      levothyroxine (SYNTHROID, LEVOTHROID) tablet   Other Relevant Orders      TSH   Severe obesity (  BMI >= 40)    Wt Readings from Last 3 Encounters:  12/04/14 226 lb 12 oz (102.853 kg)  04/25/14 229 lb (103.874 kg)  04/04/14 229 lb 8 oz (104.101 kg)   Body mass index is 42.87 kg/(m^2). Congratulated pt on weight loss. Encouraged continued effort at diet and exercise.        Return in about 6 months (around 06/05/2015) for Physical.

## 2014-12-04 NOTE — Assessment & Plan Note (Signed)
Will check TSH with labs. Continue Levothyroxine. 

## 2014-12-04 NOTE — Assessment & Plan Note (Signed)
BP Readings from Last 3 Encounters:  12/04/14 130/86  04/25/14 122/86  04/04/14 160/102   BP well controlled on current medications. Will check renal function with labs.

## 2014-12-04 NOTE — Progress Notes (Signed)
Pre visit review using our clinic review tool, if applicable. No additional management support is needed unless otherwise documented below in the visit note. 

## 2014-12-05 ENCOUNTER — Other Ambulatory Visit: Payer: Self-pay | Admitting: *Deleted

## 2014-12-05 MED ORDER — LEVOTHYROXINE SODIUM 25 MCG PO TABS: 25.0000 ug | ORAL_TABLET | Freq: Every day | ORAL | Status: DC

## 2015-02-17 ENCOUNTER — Other Ambulatory Visit: Payer: Self-pay | Admitting: Internal Medicine

## 2015-02-17 MED ORDER — LEVOTHYROXINE SODIUM 175 MCG PO TABS: 175.0000 ug | ORAL_TABLET | Freq: Every day | ORAL | Status: DC

## 2015-05-02 ENCOUNTER — Other Ambulatory Visit: Payer: Self-pay | Admitting: Internal Medicine

## 2015-06-04 ENCOUNTER — Encounter: Payer: 59 | Admitting: Internal Medicine

## 2015-06-30 ENCOUNTER — Other Ambulatory Visit: Payer: Self-pay | Admitting: Surgery

## 2015-06-30 ENCOUNTER — Telehealth: Payer: Self-pay | Admitting: *Deleted

## 2015-06-30 DIAGNOSIS — S86912A Strain of unspecified muscle(s) and tendon(s) at lower leg level, left leg, initial encounter: Secondary | ICD-10-CM

## 2015-06-30 DIAGNOSIS — S83232A Complex tear of medial meniscus, current injury, left knee, initial encounter: Secondary | ICD-10-CM

## 2015-06-30 NOTE — Telephone Encounter (Signed)
Received Workers Presenter, broadcasting request from [employer].  Sent to Medical Record release to [employer]

## 2015-07-02 ENCOUNTER — Ambulatory Visit (INDEPENDENT_AMBULATORY_CARE_PROVIDER_SITE_OTHER): Payer: 59 | Admitting: Internal Medicine

## 2015-07-02 ENCOUNTER — Encounter: Payer: Self-pay | Admitting: Internal Medicine

## 2015-07-02 VITALS — BP 104/68 | HR 69 | Temp 98.3°F | Ht 61.0 in | Wt 236.0 lb

## 2015-07-02 DIAGNOSIS — Z0000 Encounter for general adult medical examination without abnormal findings: Secondary | ICD-10-CM

## 2015-07-02 NOTE — Progress Notes (Signed)
Subjective:    Patient ID: Barbara Blake, female    DOB: [birth year], 59 y.o.   MRN: [id number]  HPI  59YO female presents for physical exam.  Recently had left knee injury at work. Seen by ortho. Scheduled for MRI tomorrow. However, knee pain has improved.  Frustrated by lack of weight loss. Notes she has been eating out more recently.  Wt Readings from Last 3 Encounters:  07/02/15 236 lb (107.049 kg)  12/04/14 226 lb 12 oz (102.853 kg)  04/25/14 229 lb (103.874 kg)    Past medical, surgical, family and social history per today's encounter.  Review of Systems  Constitutional: Negative for fever, chills, appetite change, fatigue and unexpected weight change.  Eyes: Negative for visual disturbance.  Respiratory: Negative for shortness of breath.   Cardiovascular: Negative for chest pain and leg swelling.  Gastrointestinal: Negative for nausea, vomiting, abdominal pain, diarrhea and constipation.  Musculoskeletal: Positive for myalgias and arthralgias.  Skin: Negative for color change and rash.  Hematological: Negative for adenopathy. Does not bruise/bleed easily.  Psychiatric/Behavioral: Negative for sleep disturbance and dysphoric mood. The patient is not nervous/anxious.        Objective:    BP 104/68 mmHg  Pulse 69  Temp(Src) 98.3 F (36.8 C) (Oral)  Ht 5\' 1"  (1.549 m)  Wt 236 lb (107.049 kg)  BMI 44.61 kg/m2  SpO2 97% Physical Exam  Constitutional: She is oriented to person, place, and time. She appears well-developed and well-nourished. No distress.  HENT:  Head: Normocephalic and atraumatic.  Right Ear: External ear normal.  Left Ear: External ear normal.  Nose: Nose normal.  Mouth/Throat: Oropharynx is clear and moist. No oropharyngeal exudate.  Eyes: Conjunctivae are normal. Pupils are equal, round, and reactive to light. Right eye exhibits no discharge. Left eye exhibits no discharge. No scleral icterus.  Neck: Normal range of motion. Neck supple. No  tracheal deviation present. No thyromegaly present.  Cardiovascular: Normal rate, regular rhythm, normal heart sounds and intact distal pulses.  Exam reveals no gallop and no friction rub.   No murmur heard. Pulmonary/Chest: Effort normal and breath sounds normal. No accessory muscle usage. No tachypnea. No respiratory distress. She has no decreased breath sounds. She has no wheezes. She has no rales. She exhibits no tenderness. Right breast exhibits no inverted nipple, no mass, no nipple discharge, no skin change and no tenderness. Left breast exhibits no inverted nipple, no mass, no nipple discharge, no skin change and no tenderness. Breasts are symmetrical.  Abdominal: Soft. Bowel sounds are normal. She exhibits no distension and no mass. There is no tenderness. There is no rebound and no guarding.  Musculoskeletal: Normal range of motion. She exhibits no edema or tenderness.  Lymphadenopathy:    She has no cervical adenopathy.  Neurological: She is alert and oriented to person, place, and time. No cranial nerve deficit. She exhibits normal muscle tone. Coordination normal.  Skin: Skin is warm and dry. No rash noted. She is not diaphoretic. No erythema. No pallor.  Psychiatric: She has a normal mood and affect. Her behavior is normal. Judgment and thought content normal.          Assessment & Plan:   Problem List Items Addressed This Visit      Unprioritized   Routine general medical examination at a health care facility - Primary    General medical exam normal today including breast exam. PAP and pelvic deferred as normal 2015, HPV neg. Colonoscopy UTD and reviewed. Labs  as ordered. Immunizations are UTD.      Relevant Orders   MM Digital Screening   TSH   CBC with Differential/Platelet   Comprehensive metabolic panel   Lipid panel   Microalbumin / creatinine urine ratio   Vit D  25 hydroxy (rtn osteoporosis monitoring)   Hemoglobin A1c   Severe obesity (BMI >= 40)    Wt  Readings from Last 3 Encounters:  07/02/15 236 lb (107.049 kg)  12/04/14 226 lb 12 oz (102.853 kg)  04/25/14 229 lb (103.874 kg)   Body mass index is 44.61 kg/(m^2). The patient is asked to make an attempt to improve diet and exercise patterns to aid in medical management of this problem.           Return in about 6 months (around 01/02/2016) for Recheck.

## 2015-07-02 NOTE — Patient Instructions (Signed)

## 2015-07-02 NOTE — Assessment & Plan Note (Signed)
Wt Readings from Last 3 Encounters:  07/02/15 236 lb (107.049 kg)  12/04/14 226 lb 12 oz (102.853 kg)  04/25/14 229 lb (103.874 kg)   Body mass index is 44.61 kg/(m^2). The patient is asked to make an attempt to improve diet and exercise patterns to aid in medical management of this problem.

## 2015-07-02 NOTE — Assessment & Plan Note (Signed)
General medical exam normal today including breast exam. PAP and pelvic deferred as normal 2015, HPV neg. Colonoscopy UTD and reviewed. Labs as ordered. Immunizations are UTD.

## 2015-07-02 NOTE — Progress Notes (Signed)
Pre visit review using our clinic review tool, if applicable. No additional management support is needed unless otherwise documented below in the visit note. 

## 2015-07-03 LAB — COMPREHENSIVE METABOLIC PANEL
ALBUMIN: 3.8 g/dL (ref 3.5–5.2)
ALT: 22 U/L (ref 0–35)
AST: 21 U/L (ref 0–37)
Alkaline Phosphatase: 76 U/L (ref 39–117)
BUN: 20 mg/dL (ref 6–23)
CHLORIDE: 104 meq/L (ref 96–112)
CO2: 28 meq/L (ref 19–32)
Calcium: 9.4 mg/dL (ref 8.4–10.5)
Creatinine, Ser: 0.87 mg/dL (ref 0.40–1.20)
GFR: 71.1 mL/min (ref >60.00)
Glucose, Bld: 88 mg/dL (ref 70–99)
Potassium: 3.9 mEq/L (ref 3.5–5.1)
Sodium: 140 mEq/L (ref 135–145)
Total Bilirubin: 0.8 mg/dL (ref 0.2–1.2)
Total Protein: 6.6 g/dL (ref 6.0–8.3)

## 2015-07-03 LAB — LIPID PANEL
Cholesterol: 179 mg/dL (ref 0–200)
HDL: 53.9 mg/dL (ref >39.00)
LDL CALC: 87 mg/dL (ref 0–99)
NonHDL: 125.1
Total CHOL/HDL Ratio: 3
Triglycerides: 189 mg/dL — ABNORMAL HIGH (ref 0.0–149.0)
VLDL: 37.8 mg/dL (ref 0.0–40.0)

## 2015-07-03 LAB — CBC WITH DIFFERENTIAL/PLATELET
BASOS ABS: 0 10*3/uL (ref 0.0–0.1)
Basophils Relative: 0.2 % (ref 0.0–3.0)
EOS ABS: 0.2 10*3/uL (ref 0.0–0.7)
Eosinophils Relative: 2.4 % (ref 0.0–5.0)
HCT: 41.6 % (ref 36.0–46.0)
Hemoglobin: 13.9 g/dL (ref 12.0–15.0)
LYMPHS ABS: 2.2 10*3/uL (ref 0.7–4.0)
LYMPHS PCT: 28.6 % (ref 12.0–46.0)
MCHC: 33.5 g/dL (ref 30.0–36.0)
MCV: 90.2 fl (ref 78.0–100.0)
MONO ABS: 0.4 10*3/uL (ref 0.1–1.0)
Monocytes Relative: 5.4 % (ref 3.0–12.0)
NEUTROS ABS: 4.8 10*3/uL (ref 1.4–7.7)
NEUTROS PCT: 63.4 % (ref 43.0–77.0)
PLATELETS: 246 10*3/uL (ref 150.0–400.0)
RBC: 4.61 Mil/uL (ref 3.87–5.11)
RDW: 14.7 % (ref 11.5–15.5)
WBC: 7.6 10*3/uL (ref 4.0–10.5)

## 2015-07-03 LAB — VITAMIN D 25 HYDROXY (VIT D DEFICIENCY, FRACTURES): VITD: 22.54 ng/mL — AB (ref 30.00–100.00)

## 2015-07-03 LAB — MICROALBUMIN / CREATININE URINE RATIO
Creatinine,U: 160.6 mg/dL
MICROALB/CREAT RATIO: 0.6 mg/g (ref 0.0–30.0)
Microalb, Ur: 0.9 mg/dL (ref 0.0–1.9)

## 2015-07-03 LAB — HEMOGLOBIN A1C: Hgb A1c MFr Bld: 5.4 % (ref 4.6–6.5)

## 2015-07-03 LAB — TSH: TSH: 1.64 u[IU]/mL (ref 0.35–4.50)

## 2015-07-04 ENCOUNTER — Ambulatory Visit
Admission: RE | Admit: 2015-07-04 | Discharge: 2015-07-04 | Disposition: A | Discharge status: Home / Self Care | Payer: PRIVATE HEALTH INSURANCE | Source: Ambulatory Visit | Attending: Surgery | Admitting: Surgery

## 2015-07-04 DIAGNOSIS — S86912A Strain of unspecified muscle(s) and tendon(s) at lower leg level, left leg, initial encounter: Secondary | ICD-10-CM | POA: Diagnosis not present

## 2015-07-04 DIAGNOSIS — S83232A Complex tear of medial meniscus, current injury, left knee, initial encounter: Secondary | ICD-10-CM | POA: Diagnosis present

## 2015-07-04 DIAGNOSIS — X58XXXA Exposure to other specified factors, initial encounter: Secondary | ICD-10-CM | POA: Diagnosis not present

## 2015-07-14 ENCOUNTER — Ambulatory Visit: Payer: 59 | Attending: Internal Medicine

## 2015-07-23 ENCOUNTER — Ambulatory Visit: Payer: 59

## 2015-07-29 ENCOUNTER — Ambulatory Visit
Admission: RE | Admit: 2015-07-29 | Discharge: 2015-07-29 | Disposition: A | Discharge status: Home / Self Care | Payer: 59 | Source: Ambulatory Visit | Attending: Internal Medicine | Admitting: Internal Medicine

## 2015-07-29 ENCOUNTER — Other Ambulatory Visit: Payer: Self-pay | Admitting: Internal Medicine

## 2015-07-29 DIAGNOSIS — Z0000 Encounter for general adult medical examination without abnormal findings: Secondary | ICD-10-CM | POA: Insufficient documentation

## 2015-07-29 DIAGNOSIS — Z1231 Encounter for screening mammogram for malignant neoplasm of breast: Secondary | ICD-10-CM | POA: Insufficient documentation

## 2016-01-13 ENCOUNTER — Ambulatory Visit: Payer: 59 | Admitting: Internal Medicine

## 2016-01-16 ENCOUNTER — Encounter: Payer: Self-pay | Admitting: Internal Medicine

## 2016-01-16 ENCOUNTER — Ambulatory Visit (INDEPENDENT_AMBULATORY_CARE_PROVIDER_SITE_OTHER): Payer: 59 | Admitting: Internal Medicine

## 2016-01-16 VITALS — BP 123/82 | HR 85 | Temp 98.2°F | Ht 61.0 in | Wt 241.1 lb

## 2016-01-16 DIAGNOSIS — R079 Chest pain, unspecified: Secondary | ICD-10-CM | POA: Diagnosis not present

## 2016-01-16 DIAGNOSIS — M791 Myalgia, unspecified site: Secondary | ICD-10-CM | POA: Insufficient documentation

## 2016-01-16 DIAGNOSIS — E039 Hypothyroidism, unspecified: Secondary | ICD-10-CM | POA: Diagnosis not present

## 2016-01-16 DIAGNOSIS — I10 Essential (primary) hypertension: Secondary | ICD-10-CM

## 2016-01-16 MED ORDER — ALPRAZOLAM 0.25 MG PO TABS: 0.2500 mg | ORAL_TABLET | Freq: Two times a day (BID) | ORAL | Status: DC | PRN

## 2016-01-16 MED ORDER — LOSARTAN POTASSIUM-HCTZ 100-12.5 MG PO TABS
1.0000 | ORAL_TABLET | Freq: Every day | ORAL | Status: DC
Start: 2016-01-16 — End: 2017-05-10

## 2016-01-16 MED ORDER — LEVOTHYROXINE SODIUM 175 MCG PO TABS: 175.0000 ug | ORAL_TABLET | Freq: Every day | ORAL | Status: DC

## 2016-01-16 NOTE — Assessment & Plan Note (Signed)
Check TSH with labs today. Continue Levothyroxine. 

## 2016-01-16 NOTE — Progress Notes (Signed)
Subjective:    Patient ID: Barbara Blake, female    DOB: 04-12-1956, 60 y.o.   MRN: [id number]  HPI  60YO female presents for follow up.  Frustrated by weight gain. Not currently following any specific diet or exercise plan. Notes difficult work schedule with long hours, often getting home after 8pm and eating out prior to bedtime. Not exercising.  Chest/epigastric pain - One episode. Started while sitting after eating dinner and drinking large glass of water. Pressure in chest or upper abdomen that radiated to jaw. Had some residual pain that morning. Also had headache. No diaphoresis. Had increased belching. No recurrent symptoms.  Having some generalized aching pain in muscles in upper arms and legs. 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 Not taking anything for pain.  Wt Readings from Last 3 Encounters:  01/16/16 241 lb 2 oz (109.374 kg)  07/04/15 226 lb (102.513 kg)  07/02/15 236 lb (107.049 kg)   BP Readings from Last 3 Encounters:  01/16/16 123/82  07/02/15 104/68  12/04/14 130/86    Past Medical History  Diagnosis Date  . Hypothyroidism   . Asthma   . Anxiety   . HTN (hypertension)   . Obesity    Family History  Problem Relation Age of Onset  . Alzheimer's disease Mother   . Hypertension Mother   . Cancer Mother     colon  . Lupus Daughter   . Heart attack Maternal Grandmother   . Colon cancer Neg Hx   . Breast cancer Other   . Breast cancer Maternal Aunt    Past Surgical History  Procedure Laterality Date  . Appendectomy    . Dilation and curettage of uterus    . Lipoma excision     Social History   Social History  . Marital Status: Married    Spouse Name: N/A  . Number of Children: 2  . Years of Education: N/A   Occupational History  . 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 Smoking status: Never Smoker   . Smokeless tobacco: Never Used    . Alcohol Use: No  . Drug Use: No  . Sexual Activity: Not Asked   Other Topics Concern  . None   Social History Narrative   Lives in [place].   Works at [employer]      Regular Exercise -  Not at this time   Daily Caffeine Use:  1 cup hot tea daily             Review of Systems  Constitutional: Negative for fever, chills, appetite change, fatigue and unexpected weight change.  Eyes: Negative for visual disturbance.  Respiratory: Negative for cough, chest tightness and shortness of breath.   Cardiovascular: Positive for chest pain. Negative for palpitations and leg swelling.  Gastrointestinal: Negative for abdominal pain, diarrhea and constipation.  Musculoskeletal: Positive for myalgias and arthralgias.  Skin: Negative for color change and rash.  Hematological: Negative for adenopathy. Does not bruise/bleed easily.  Psychiatric/Behavioral: Negative for sleep disturbance and dysphoric mood. The patient is not nervous/anxious.        Objective:    BP 123/82 mmHg  Pulse 85  Temp(Src) 98.2 F (36.8 C) (Oral)  Ht _0  (1.549 m)  Wt 241 lb 2 oz (109.374 kg)  BMI 45.58 kg/m2  SpO2 97% Physical Exam  Constitutional: She is oriented to  person, place, and time. She appears well-developed and well-nourished. No distress.  HENT:  Head: Normocephalic and atraumatic.  Right Ear: External ear normal.  Left Ear: External ear normal.  Nose: Nose normal.  Mouth/Throat: Oropharynx is clear and moist. No oropharyngeal exudate.  Eyes: Conjunctivae are normal. Pupils are equal, round, and reactive to light. Right eye exhibits no discharge. Left eye exhibits no discharge. No scleral icterus.  Neck: Normal range of motion. Neck supple. No tracheal deviation present. No thyromegaly present.  Cardiovascular: Normal rate, regular rhythm, normal heart sounds and intact distal pulses.  Exam reveals no gallop and no friction rub.   No murmur heard. Pulmonary/Chest: Effort normal and breath  sounds normal. No respiratory distress. She has no wheezes. She has no rales. She exhibits no tenderness.  Musculoskeletal: Normal range of motion. She exhibits no edema or tenderness.  Lymphadenopathy:    She has no cervical adenopathy.  Neurological: She is alert and oriented to person, place, and time. No cranial nerve deficit. She exhibits normal muscle tone. Coordination normal.  Skin: Skin is warm and dry. No rash noted. She is not diaphoretic. No erythema. No pallor.  Psychiatric: She has a normal mood and affect. Her behavior is normal. Judgment and thought content normal.          Assessment & Plan:   Problem List Items Addressed This Visit      Unprioritized   Hypertension - Primary    BP Readings from Last 3 Encounters:  01/16/16 123/82  07/02/15 104/68  12/04/14 130/86   BP well controlled. Renal function with labs. Continue Losartan-HCTZ.      Relevant Medications   losartan-hydrochlorothiazide (HYZAAR) 100-12.5 MG tablet   Other Relevant Orders   Comprehensive metabolic panel   Lipid panel   Hypothyroidism    Check TSH with labs today. Continue Levothyroxine.      Relevant Medications   levothyroxine (SYNTHROID, LEVOTHROID) 175 MCG tablet   Other Relevant Orders   TSH   Myalgia    Aching muscle pain in shoulders most likely secondary to overuse and strain with her job. Will check markers of inflammation and CK, ANA today. Follow up in 4 weeks.      Relevant Orders   Sed Rate (ESR)   C-reactive protein   Antinuclear Antib (ANA)   CK (Creatine Kinase)   Pain in the chest    Single episode of chest pain/epigastric pain. Most consistent with GERD. EKG today NSR with no ST changes. Will monitor for now. Encouraged her to avoid eating large meals prior to bedtime. Follow up if any recurrent symptoms and in 4 weeks.      Relevant Orders   EKG 12-Lead (Completed)   Severe obesity (BMI >= 40) (HCC)    Wt Readings from Last 3 Encounters:  01/16/16 241 lb 2  oz (109.374 kg)  07/04/15 226 lb (102.513 kg)  07/02/15 236 lb (107.049 kg)   Body mass index is 45.58 kg/(m^2). Encouraged her to try to prepare some meals prior to the work week. Encouraged exercise such as walking prior to work. Discussed some medications to help with appetite suppression such as Saxenda. She will look into this.      Relevant Orders   Hemoglobin A1c       Return in about 4 weeks (around 02/13/2016) for Recheck.

## 2016-01-16 NOTE — Patient Instructions (Addendum)
Look into coverage for Saxenda.  [email or web address]  Labs today.  Followup in 4 weeks.

## 2016-01-16 NOTE — Assessment & Plan Note (Signed)
BP Readings from Last 3 Encounters:  01/16/16 123/82  07/02/15 104/68  12/04/14 130/86   BP well controlled. Renal function with labs. Continue Losartan-HCTZ.

## 2016-01-16 NOTE — Assessment & Plan Note (Addendum)
Wt Readings from Last 3 Encounters:  01/16/16 241 lb 2 oz (109.374 kg)  07/04/15 226 lb (102.513 kg)  07/02/15 236 lb (107.049 kg)   Body mass index is 45.58 kg/(m^2). Encouraged her to try to prepare some meals prior to the work week. Encouraged exercise such as walking prior to work. Discussed some medications to help with appetite suppression such as Saxenda. She will look into this.

## 2016-01-16 NOTE — Assessment & Plan Note (Signed)
Aching muscle pain in shoulders most likely secondary to overuse and strain with her job. Will check markers of inflammation and CK, ANA today. Follow up in 4 weeks.

## 2016-01-16 NOTE — Progress Notes (Signed)
Pre visit review using our clinic review tool, if applicable. No additional management support is needed unless otherwise documented below in the visit note. 

## 2016-01-16 NOTE — Assessment & Plan Note (Signed)
Single episode of chest pain/epigastric pain. Most consistent with GERD. EKG today NSR with no ST changes. Will monitor for now. Encouraged her to avoid eating large meals prior to bedtime. Follow up if any recurrent symptoms and in 4 weeks.

## 2016-01-17 LAB — COMPREHENSIVE METABOLIC PANEL
ALK PHOS: 76 U/L (ref 33–130)
ALT: 16 U/L (ref 6–29)
AST: 18 U/L (ref 10–35)
Albumin: 3.8 g/dL (ref 3.6–5.1)
BUN: 19 mg/dL (ref 7–25)
CALCIUM: 9.1 mg/dL (ref 8.6–10.4)
CHLORIDE: 105 mmol/L (ref 98–110)
CO2: 28 mmol/L (ref 20–31)
Creat: 0.87 mg/dL (ref 0.50–1.05)
Glucose, Bld: 71 mg/dL (ref 65–99)
POTASSIUM: 4.8 mmol/L (ref 3.5–5.3)
Sodium: 141 mmol/L (ref 135–146)
TOTAL PROTEIN: 6.6 g/dL (ref 6.1–8.1)
Total Bilirubin: 0.7 mg/dL (ref 0.2–1.2)

## 2016-01-17 LAB — C-REACTIVE PROTEIN: CRP: <0.5 mg/dL (ref <0.60)

## 2016-01-17 LAB — LIPID PANEL
CHOLESTEROL: 175 mg/dL (ref 125–200)
HDL: 52 mg/dL (ref >=46)
LDL CALC: 96 mg/dL (ref <130)
TRIGLYCERIDES: 137 mg/dL (ref <150)
Total CHOL/HDL Ratio: 3.4 Ratio (ref <=5.0)
VLDL: 27 mg/dL (ref <30)

## 2016-01-17 LAB — SEDIMENTATION RATE: Sed Rate: 11 mm/hr (ref 0–30)

## 2016-01-17 LAB — HEMOGLOBIN A1C
HEMOGLOBIN A1C: 5.7 % — AB (ref <5.7)
Mean Plasma Glucose: 117 mg/dL — ABNORMAL HIGH (ref <117)

## 2016-01-17 LAB — CK: CK TOTAL: 115 U/L (ref 7–177)

## 2016-01-17 LAB — TSH: TSH: 1.447 u[IU]/mL (ref 0.350–4.500)

## 2016-01-19 LAB — ANTI-NUCLEAR AB-TITER (ANA TITER)

## 2016-01-19 LAB — ANA: ANA: POSITIVE — AB

## 2016-01-20 ENCOUNTER — Telehealth: Payer: Self-pay | Admitting: Internal Medicine

## 2016-01-20 DIAGNOSIS — M2550 Pain in unspecified joint: Secondary | ICD-10-CM

## 2016-01-20 NOTE — Telephone Encounter (Signed)
-----   Message from Alver Fisher, [place] sent at 01/20/2016  9:26 AM EST ----- Notified pt of test results, pt verbalized understanding. Pt states that she wants to go to [place]

## 2016-01-27 DIAGNOSIS — E039 Hypothyroidism, unspecified: Secondary | ICD-10-CM | POA: Diagnosis not present

## 2016-01-27 DIAGNOSIS — R768 Other specified abnormal immunological findings in serum: Secondary | ICD-10-CM | POA: Diagnosis not present

## 2016-01-27 DIAGNOSIS — M791 Myalgia: Secondary | ICD-10-CM | POA: Diagnosis not present

## 2016-02-20 ENCOUNTER — Ambulatory Visit (INDEPENDENT_AMBULATORY_CARE_PROVIDER_SITE_OTHER): Payer: 59 | Admitting: Internal Medicine

## 2016-02-20 ENCOUNTER — Encounter: Payer: Self-pay | Admitting: Internal Medicine

## 2016-02-20 DIAGNOSIS — I10 Essential (primary) hypertension: Secondary | ICD-10-CM | POA: Diagnosis not present

## 2016-02-20 DIAGNOSIS — E559 Vitamin D deficiency, unspecified: Secondary | ICD-10-CM

## 2016-02-20 DIAGNOSIS — R768 Other specified abnormal immunological findings in serum: Secondary | ICD-10-CM

## 2016-02-20 DIAGNOSIS — M2550 Pain in unspecified joint: Secondary | ICD-10-CM | POA: Insufficient documentation

## 2016-02-20 NOTE — Progress Notes (Signed)
Pre visit review using our clinic review tool, if applicable. No additional management support is needed unless otherwise documented below in the visit note. 

## 2016-02-20 NOTE — Assessment & Plan Note (Signed)
Wt Readings from Last 3 Encounters:  02/20/16 235 lb 4 oz (106.709 kg)  01/16/16 241 lb 2 oz (109.374 kg)  07/04/15 226 lb (102.513 kg)   Body mass index is 44.47 kg/(m^2). Congratulated pt on weight loss. Encouraged continued healthy diet and exercise.

## 2016-02-20 NOTE — Assessment & Plan Note (Signed)
Symptoms improving with physical activity. Will continue.

## 2016-02-20 NOTE — Progress Notes (Signed)
Subjective:    Patient ID: Barbara Blake, female    DOB: August 04, 1956, 60 y.o.   MRN: [id number]  HPI  60YO female presents for follow up.  Working hard to lose weight. Limiting bread, cakes, cookies. Joined gym at hospital and started walking.  Seen by rheumatology for pos ANA. Exam was normal. Labs performed showed low Vit D 12.5 and she was started on Vit D 50000units weekly. Feeling well. Continues to have some arthralgia and myalgia, however this is improving. Continues to work long hours.  Wt Readings from Last 3 Encounters:  02/20/16 235 lb 4 oz (106.709 kg)  01/16/16 241 lb 2 oz (109.374 kg)  07/04/15 226 lb (102.513 kg)   BP Readings from Last 3 Encounters:  02/20/16 130/82  01/16/16 123/82  07/02/15 104/68    Past Medical History  Diagnosis Date  . Hypothyroidism   . Asthma   . Anxiety   . HTN (hypertension)   . Obesity    Family History  Problem Relation Age of Onset  . Alzheimer's disease Mother   . Hypertension Mother   . Cancer Mother     colon  . Lupus Daughter   . Heart attack Maternal Grandmother   . Colon cancer Neg Hx   . Breast cancer Other   . Breast cancer Maternal Aunt    Past Surgical History  Procedure Laterality Date  . Appendectomy    . Dilation and curettage of uterus    . Lipoma excision     Social History   Social History  . Marital Status: Married    Spouse Name: N/A  . Number of Children: 2  . Years of Education: N/A   Occupational History  . 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 Smoking status: Never Smoker   . Smokeless tobacco: Never Used  . Alcohol Use: No  . Drug Use: No  . Sexual Activity: Not Asked   Other Topics Concern  . None   Social History Narrative   Lives in [place].   Works at [employer]      Regular Exercise -  Not at this time   Daily Caffeine Use:  1 cup hot tea daily             Review of Systems  Constitutional: Negative for fever,  chills, appetite change, fatigue and unexpected weight change.  Eyes: Negative for visual disturbance.  Respiratory: Negative for shortness of breath.   Cardiovascular: Negative for chest pain, palpitations and leg swelling.  Gastrointestinal: Negative for nausea, vomiting, abdominal pain, diarrhea and constipation.  Musculoskeletal: Positive for myalgias and arthralgias.  Skin: Negative for color change and rash.  Hematological: Negative for adenopathy. Does not bruise/bleed easily.  Psychiatric/Behavioral: Negative for suicidal ideas, sleep disturbance and dysphoric mood. The patient is not nervous/anxious.        Objective:    BP 130/82 mmHg  Pulse 68  Temp(Src) 98 F (36.7 C) (Oral)  Ht 5\' 1"  (1.549 m)  Wt 235 lb 4 oz (106.709 kg)  BMI 44.47 kg/m2  SpO2 98% Physical Exam  Constitutional: She is oriented to person, place, and time. She appears well-developed and well-nourished. No distress.  HENT:  Head: Normocephalic and atraumatic.  Right Ear: External ear normal.  Left Ear: External ear normal.  Nose: Nose normal.  Mouth/Throat: Oropharynx is clear and moist. No oropharyngeal exudate.  Eyes: Conjunctivae are normal. Pupils are equal,  round, and reactive to light. Right eye exhibits no discharge. Left eye exhibits no discharge. No scleral icterus.  Neck: Normal range of motion. Neck supple. No tracheal deviation present. No thyromegaly present.  Cardiovascular: Normal rate, regular rhythm, normal heart sounds and intact distal pulses.  Exam reveals no gallop and no friction rub.   No murmur heard. Pulmonary/Chest: Effort normal and breath sounds normal. No respiratory distress. She has no wheezes. She has no rales. She exhibits no tenderness.  Musculoskeletal: Normal range of motion. She exhibits no edema or tenderness.  Lymphadenopathy:    She has no cervical adenopathy.  Neurological: She is alert and oriented to person, place, and time. No cranial nerve deficit. She  exhibits normal muscle tone. Coordination normal.  Skin: Skin is warm and dry. No rash noted. She is not diaphoretic. No erythema. No pallor.  Psychiatric: She has a normal mood and affect. Her behavior is normal. Judgment and thought content normal.          Assessment & Plan:   Problem List Items Addressed This Visit      Unprioritized   Arthralgia    Symptoms improving with physical activity. Will continue.      Hypertension    BP Readings from Last 3 Encounters:  02/20/16 130/82  01/16/16 123/82  07/02/15 104/68   BP well controlled. Continue Losartan-HCTZ.      Positive ANA (antinuclear antibody)    Reviewed notes from Rheumatology. Symptoms improving. Continue supplemental Vit D with plan to recheck level in 3 months.      Severe obesity (BMI >= 40) (HCC) - Primary    Wt Readings from Last 3 Encounters:  02/20/16 235 lb 4 oz (106.709 kg)  01/16/16 241 lb 2 oz (109.374 kg)  07/04/15 226 lb (102.513 kg)   Body mass index is 44.47 kg/(m^2). Congratulated pt on weight loss. Encouraged continued healthy diet and exercise.       Vitamin D deficiency    Vit D low 12.5. Started Vit D 50000units weekly x 12 weeks. Plan to recheck level in 3 months.          Return in about 3 months (around 05/22/2016) for Recheck.  Ronette Deter, MD Internal Medicine [facility]

## 2016-02-20 NOTE — Assessment & Plan Note (Signed)
BP Readings from Last 3 Encounters:  02/20/16 130/82  01/16/16 123/82  07/02/15 104/68   BP well controlled. Continue Losartan-HCTZ.

## 2016-02-20 NOTE — Assessment & Plan Note (Signed)
Reviewed notes from Rheumatology. Symptoms improving. Continue supplemental Vit D with plan to recheck level in 3 months.

## 2016-02-20 NOTE — Patient Instructions (Signed)
Continue current medications    Follow up in 3 months

## 2016-02-20 NOTE — Assessment & Plan Note (Signed)
Vit D low 12.5. Started Vit D 50000units weekly x 12 weeks. Plan to recheck level in 3 months.

## 2016-05-21 ENCOUNTER — Ambulatory Visit: Payer: Self-pay | Admitting: Internal Medicine

## 2016-06-30 ENCOUNTER — Ambulatory Visit (INDEPENDENT_AMBULATORY_CARE_PROVIDER_SITE_OTHER): Payer: 59 | Admitting: Internal Medicine

## 2016-06-30 ENCOUNTER — Encounter: Payer: Self-pay | Admitting: Internal Medicine

## 2016-06-30 VITALS — BP 162/98 | HR 74 | Ht 61.0 in | Wt 236.8 lb

## 2016-06-30 DIAGNOSIS — I10 Essential (primary) hypertension: Secondary | ICD-10-CM | POA: Diagnosis not present

## 2016-06-30 DIAGNOSIS — J4520 Mild intermittent asthma, uncomplicated: Secondary | ICD-10-CM

## 2016-06-30 DIAGNOSIS — E559 Vitamin D deficiency, unspecified: Secondary | ICD-10-CM | POA: Diagnosis not present

## 2016-06-30 DIAGNOSIS — F411 Generalized anxiety disorder: Secondary | ICD-10-CM | POA: Insufficient documentation

## 2016-06-30 DIAGNOSIS — E039 Hypothyroidism, unspecified: Secondary | ICD-10-CM

## 2016-06-30 MED ORDER — LEVOTHYROXINE SODIUM 175 MCG PO TABS: 175.0000 ug | ORAL_TABLET | Freq: Every day | ORAL | Status: DC

## 2016-06-30 MED ORDER — AMLODIPINE BESYLATE 2.5 MG PO TABS: 2.5000 mg | ORAL_TABLET | Freq: Every day | ORAL | Status: DC

## 2016-06-30 MED ORDER — ALPRAZOLAM 0.25 MG PO TABS: 0.2500 mg | ORAL_TABLET | Freq: Two times a day (BID) | ORAL | Status: DC | PRN

## 2016-06-30 MED ORDER — ALBUTEROL SULFATE HFA 108 (90 BASE) MCG/ACT IN AERS: 2.0000 | INHALATION_SPRAY | Freq: Four times a day (QID) | RESPIRATORY_TRACT | Status: DC | PRN

## 2016-06-30 NOTE — Progress Notes (Signed)
Subjective:    Patient ID: Barbara Blake, female    DOB: 08/15/1956, 60 y.o.   MRN: [id number]  HPI  60YO female presents for follow up.  Increased stress. Yolanda Bonine was taken by his father. She has been very anxious and worried about this. Unable to concentrate at work. Having trouble sleeping. Out of alprazolam.  Recent worsening asthma symptoms with cough and tightness in throat and chest at times. Mild occasional dyspnea. No persistent symptoms. No clear triggers. Out of Albutererol. Ongoing last few days. No fever, chills. Questions if related to anxiety.  HTN - Has not recently checked BP. Compliant with medication.  Wt Readings from Last 3 Encounters:  06/30/16 236 lb 12.8 oz (107.412 kg)  02/20/16 235 lb 4 oz (106.709 kg)  01/16/16 241 lb 2 oz (109.374 kg)   BP Readings from Last 3 Encounters:  06/30/16 162/98  02/20/16 130/82  01/16/16 123/82    Past Medical History  Diagnosis Date  . Hypothyroidism   . Asthma   . Anxiety   . HTN (hypertension)   . Obesity    Family History  Problem Relation Age of Onset  . Alzheimer's disease Mother   . Hypertension Mother   . Cancer Mother     colon  . Lupus Daughter   . Heart attack Maternal Grandmother   . Colon cancer Neg Hx   . Breast cancer Other   . Breast cancer Maternal Aunt    Past Surgical History  Procedure Laterality Date  . Appendectomy    . Dilation and curettage of uterus    . Lipoma excision     Social History   Social History  . Marital Status: Married    Spouse Name: N/A  . Number of Children: 2  . Years of Education: N/A   Occupational History  . 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 Smoking status: Never Smoker   . Smokeless tobacco: Never Used  . Alcohol Use: No  . Drug Use: No  . Sexual Activity: Not Asked   Other Topics Concern  . None   Social History Narrative   Lives in [place].   Works at [employer]      Regular Exercise  -  Not at this time   Daily Caffeine Use:  1 cup hot tea daily             Review of Systems  Constitutional: Negative for fever, chills, appetite change, fatigue and unexpected weight change.  Eyes: Negative for visual disturbance.  Respiratory: Positive for cough, chest tightness and shortness of breath. Negative for wheezing.   Cardiovascular: Negative for chest pain, palpitations and leg swelling.  Gastrointestinal: Negative for nausea, vomiting, abdominal pain, diarrhea and constipation.  Musculoskeletal: Negative for myalgias and arthralgias.  Skin: Negative for color change and rash.  Hematological: Negative for adenopathy. Does not bruise/bleed easily.  Psychiatric/Behavioral: Positive for sleep disturbance and dysphoric mood. Negative for suicidal ideas. The patient is nervous/anxious.        Objective:    BP 162/98 mmHg  Pulse 74  Ht 5\' 1"  (1.549 m)  Wt 236 lb 12.8 oz (107.412 kg)  BMI 44.77 kg/m2  SpO2 98% Physical Exam  Constitutional: She is oriented to person, place, and time. She appears well-developed and well-nourished. No distress.  HENT:  Head: Normocephalic and atraumatic.  Right Ear: External ear normal.  Left Ear: External ear normal.  Nose: Nose normal.  Mouth/Throat: Oropharynx is clear and moist. No oropharyngeal exudate.  Eyes: Conjunctivae are normal. Pupils are equal, round, and reactive to light. Right eye exhibits no discharge. Left eye exhibits no discharge. No scleral icterus.  Neck: Normal range of motion. Neck supple. No tracheal deviation present. No thyromegaly present.  Cardiovascular: Normal rate, regular rhythm, normal heart sounds and intact distal pulses.  Exam reveals no gallop and no friction rub.   No murmur heard. Pulmonary/Chest: Effort normal and breath sounds normal. No accessory muscle usage. No tachypnea. No respiratory distress. She has no decreased breath sounds. She has no wheezes. She has no rhonchi. She has no rales. She  exhibits no tenderness.  Musculoskeletal: Normal range of motion. She exhibits no edema or tenderness.  Lymphadenopathy:    She has no cervical adenopathy.  Neurological: She is alert and oriented to person, place, and time. No cranial nerve deficit. She exhibits normal muscle tone. Coordination normal.  Skin: Skin is warm and dry. No rash noted. She is not diaphoretic. No erythema. No pallor.  Psychiatric: She has a normal mood and affect. Her behavior is normal. Judgment and thought content normal.          Assessment & Plan:   Problem List Items Addressed This Visit      Unprioritized   Anxiety state    Recent increased anxiety. Offered support today. Will continue prn Alprazolam. Follow up in 4 weeks and prn.      Relevant Medications   ALPRAZolam (XANAX) 0.25 MG tablet   Asthma, mild intermittent    Exam is normal today. Suspect anxiety playing a role. Will restart prn Albuterol. Follow up recheck in 1 week.      Relevant Medications   albuterol (PROVENTIL HFA;VENTOLIN HFA) 108 (90 Base) MCG/ACT inhaler   Hypertension - Primary    BP Readings from Last 3 Encounters:  06/30/16 162/98  02/20/16 130/82  01/16/16 123/82   BP elevated today, however she is visibly upset. Will start Amlodipine 2.5mg  daily and recheck BP in 1 week. Continue Losartan-HCTZ.      Relevant Medications   amLODipine (NORVASC) 2.5 MG tablet   Other Relevant Orders   Comprehensive metabolic panel   Hypothyroidism    Will check TSH with labs. Continue Levothyroxine.      Relevant Medications   levothyroxine (SYNTHROID, LEVOTHROID) 175 MCG tablet   Other Relevant Orders   TSH   Vitamin D deficiency    Will recheck Vit D with labs today.      Relevant Orders   VITAMIN D 25 Hydroxy (Vit-D Deficiency, Fractures)       Return in about 4 weeks (around 07/28/2016) for New Patient.  Ronette Deter, MD Internal Medicine [facility]

## 2016-06-30 NOTE — Assessment & Plan Note (Signed)
BP Readings from Last 3 Encounters:  06/30/16 162/98  02/20/16 130/82  01/16/16 123/82   BP elevated today, however she is visibly upset. Will start Amlodipine 2.5mg  daily and recheck BP in 1 week. Continue Losartan-HCTZ.

## 2016-06-30 NOTE — Patient Instructions (Addendum)
Start Amlodipine 2.5mg  daily for BP >160/100.   Labs today.   Recheck blood pressure next week.

## 2016-06-30 NOTE — Assessment & Plan Note (Signed)
Will recheck Vit D with labs today. 

## 2016-06-30 NOTE — Assessment & Plan Note (Signed)
Will check TSH with labs. Continue Levothyroxine. 

## 2016-06-30 NOTE — Assessment & Plan Note (Signed)
Exam is normal today. Suspect anxiety playing a role. Will restart prn Albuterol. Follow up recheck in 1 week.

## 2016-06-30 NOTE — Assessment & Plan Note (Signed)
Recent increased anxiety. Offered support today. Will continue prn Alprazolam. Follow up in 4 weeks and prn.

## 2016-06-30 NOTE — Progress Notes (Signed)
Pre visit review using our clinic review tool, if applicable. No additional management support is needed unless otherwise documented below in the visit note. 

## 2016-07-01 LAB — COMPREHENSIVE METABOLIC PANEL
ALK PHOS: 84 U/L (ref 39–117)
ALT: 20 U/L (ref 0–35)
AST: 20 U/L (ref 0–37)
Albumin: 3.9 g/dL (ref 3.5–5.2)
BUN: 22 mg/dL (ref 6–23)
CO2: 28 mEq/L (ref 19–32)
CREATININE: 1.09 mg/dL (ref 0.40–1.20)
Calcium: 9.6 mg/dL (ref 8.4–10.5)
Chloride: 104 mEq/L (ref 96–112)
GFR: 54.63 mL/min — ABNORMAL LOW (ref >60.00)
Glucose, Bld: 96 mg/dL (ref 70–99)
POTASSIUM: 4 meq/L (ref 3.5–5.1)
SODIUM: 141 meq/L (ref 135–145)
TOTAL PROTEIN: 7.1 g/dL (ref 6.0–8.3)
Total Bilirubin: 0.9 mg/dL (ref 0.2–1.2)

## 2016-07-01 LAB — TSH: TSH: 0.34 u[IU]/mL — ABNORMAL LOW (ref 0.35–4.50)

## 2016-07-01 LAB — VITAMIN D 25 HYDROXY (VIT D DEFICIENCY, FRACTURES): VITD: 21.71 ng/mL — AB (ref 30.00–100.00)

## 2016-08-03 ENCOUNTER — Ambulatory Visit: Payer: Self-pay | Admitting: Family

## 2016-08-03 DIAGNOSIS — Z0289 Encounter for other administrative examinations: Secondary | ICD-10-CM

## 2016-09-20 ENCOUNTER — Encounter: Payer: Self-pay | Admitting: Family

## 2016-09-20 ENCOUNTER — Ambulatory Visit (INDEPENDENT_AMBULATORY_CARE_PROVIDER_SITE_OTHER): Payer: 59 | Admitting: Family

## 2016-09-20 VITALS — BP 146/96 | HR 81 | Temp 98.4°F | Ht 61.0 in | Wt 236.4 lb

## 2016-09-20 DIAGNOSIS — E039 Hypothyroidism, unspecified: Secondary | ICD-10-CM | POA: Diagnosis not present

## 2016-09-20 DIAGNOSIS — J4520 Mild intermittent asthma, uncomplicated: Secondary | ICD-10-CM | POA: Diagnosis not present

## 2016-09-20 DIAGNOSIS — Z7689 Persons encountering health services in other specified circumstances: Secondary | ICD-10-CM | POA: Diagnosis not present

## 2016-09-20 DIAGNOSIS — F419 Anxiety disorder, unspecified: Secondary | ICD-10-CM

## 2016-09-20 DIAGNOSIS — I10 Essential (primary) hypertension: Secondary | ICD-10-CM | POA: Diagnosis not present

## 2016-09-20 NOTE — Patient Instructions (Signed)
Please follow up with CPE.

## 2016-09-20 NOTE — Assessment & Plan Note (Signed)
Elevated. Advised patient to take norvasc qhs and to start monitoring BP at home. Will follow at CPE.

## 2016-09-20 NOTE — Assessment & Plan Note (Signed)
Asymptomatic. Will check TSH at CPE.

## 2016-09-20 NOTE — Assessment & Plan Note (Signed)
Stable.  Will continue to follow

## 2016-09-20 NOTE — Assessment & Plan Note (Signed)
Stable. Takes xanax very rarely. Discussed risks of BZDs and patient agreed with long term plan of decreasing/discontinuing. New CSC.

## 2016-09-20 NOTE — Progress Notes (Signed)
Subjective:    Patient ID: Barbara Blake, female    DOB: Mar 26, 1956, 60 y.o.   MRN: [id number]  CC: Barbara Blake is a 60 y.o. female who presents today for follow up.   HPI: Patient here for follow-up and to establish care.  HTN- Only taking norvasc PRN. Takes hyzaar daily. Takes BP at work. Denies exertional chest pain or pressure, numbness or tingling radiating to left arm or jaw, palpitations, dizziness, frequent headaches, changes in vision, or shortness of breath.   Anxiety- Takes half tab 1-2x month. Started it when she was taking care of her mother. Occasionally uses it for work related anxiety. Doesn't drink alcohol. No h/o panic attacks.   Hypothyroidism- No h/o nodules. Doesn't follow with endocrine.   Asthma - well controlled.        HISTORY:  Past Medical History:  Diagnosis Date  . Anxiety   . Asthma   . HTN (hypertension)   . Hypothyroidism   . Obesity    Past Surgical History:  Procedure Laterality Date  . APPENDECTOMY    . DILATION AND CURETTAGE OF UTERUS    . LIPOMA EXCISION     Family History  Problem Relation Age of Onset  . Alzheimer's disease Mother   . Hypertension Mother   . Cancer Mother     colon  . Lupus Daughter   . Heart attack Maternal Grandmother   . Breast cancer Other   . Breast cancer Maternal Aunt   . Colon cancer Neg Hx     Allergies: Review of patient's allergies indicates no known allergies. Current Outpatient Prescriptions on File Prior to Visit  Medication Sig Dispense Refill  . albuterol (PROVENTIL HFA;VENTOLIN HFA) 108 (90 Base) MCG/ACT inhaler Inhale 2 puffs into the lungs every 6 (six) hours as needed for wheezing or shortness of breath. 1 Inhaler 0  . ALPRAZolam (XANAX) 0.25 MG tablet Take 1 tablet (0.25 mg total) by mouth 2 (two) times daily as needed. 60 tablet 3  . amLODipine (NORVASC) 2.5 MG tablet Take 1 tablet (2.5 mg total) by mouth daily. 90 tablet 3  . levothyroxine (SYNTHROID, LEVOTHROID) 175 MCG  tablet Take 1 tablet (175 mcg total) by mouth daily before breakfast. 90 tablet 3  . losartan-hydrochlorothiazide (HYZAAR) 100-12.5 MG tablet Take 1 tablet by mouth daily. 90 tablet 3   No current facility-administered medications on file prior to visit.     Social History  Substance Use Topics  . Smoking status: Never Smoker  . Smokeless tobacco: Never Used  . Alcohol use No    Review of Systems  Constitutional: Negative for chills and fever.  Respiratory: Negative for cough.   Cardiovascular: Negative for chest pain and palpitations.  Gastrointestinal: Negative for nausea and vomiting.      Objective:    BP (!) 146/96   Pulse 81   Temp 98.4 F (36.9 C) (Oral)   Ht 5\' 1"  (1.549 m)   Wt 236 lb 6.4 oz (107.2 kg)   SpO2 99%   BMI 44.67 kg/m  BP Readings from Last 3 Encounters:  09/20/16 (!) 146/96  06/30/16 (!) 162/98  02/20/16 130/82   Wt Readings from Last 3 Encounters:  09/20/16 236 lb 6.4 oz (107.2 kg)  06/30/16 236 lb 12.8 oz (107.4 kg)  02/20/16 235 lb 4 oz (106.7 kg)    Physical Exam  Constitutional: She appears well-developed and well-nourished.  Eyes: Conjunctivae are normal.  Cardiovascular: Normal rate, regular rhythm, normal heart sounds and  normal pulses.   Pulmonary/Chest: Effort normal and breath sounds normal. She has no wheezes. She has no rhonchi. She has no rales.  Neurological: She is alert.  Skin: Skin is warm and dry.  Psychiatric: She has a normal mood and affect. Her speech is normal and behavior is normal. Thought content normal.  Vitals reviewed.      Assessment & Plan:   Problem List Items Addressed This Visit      Cardiovascular and Mediastinum   Hypertension    Elevated. Advised patient to take norvasc qhs and to start monitoring BP at home. Will follow at CPE.         Respiratory   Asthma, mild intermittent    Stable. Will continue to follow.         Endocrine   Hypothyroidism    Asymptomatic. Will check TSH at CPE.           Other   Anxiety    Stable. Takes xanax very rarely. Discussed risks of BZDs and patient agreed with long term plan of decreasing/discontinuing. New CSC.         Other Visit Diagnoses    Encounter to establish care    -  Primary   Relevant Orders   MM DIGITAL SCREENING BILATERAL       I am having Ms. Train maintain her losartan-hydrochlorothiazide, levothyroxine, ALPRAZolam, amLODipine, and albuterol.   No orders of the defined types were placed in this encounter.   Return precautions given.   Risks, benefits, and alternatives of the medications and treatment plan prescribed today were discussed, and patient expressed understanding.   Education regarding symptom management and diagnosis given to patient on AVS.  Continue to follow with Rica Mast, MD for routine health maintenance.   Danelle Berry and I agreed with plan.   Mable Paris, FNP

## 2016-09-20 NOTE — Progress Notes (Signed)
Pre visit review using our clinic review tool, if applicable. No additional management support is needed unless otherwise documented below in the visit note. 

## 2016-10-19 ENCOUNTER — Ambulatory Visit
Admission: RE | Admit: 2016-10-19 | Discharge: 2016-10-19 | Disposition: A | Discharge status: Home / Self Care | Payer: 59 | Source: Ambulatory Visit | Attending: Family | Admitting: Family

## 2016-10-19 ENCOUNTER — Other Ambulatory Visit: Payer: Self-pay | Admitting: Family

## 2016-10-19 DIAGNOSIS — Z1231 Encounter for screening mammogram for malignant neoplasm of breast: Secondary | ICD-10-CM | POA: Diagnosis not present

## 2016-10-19 DIAGNOSIS — Z7689 Persons encountering health services in other specified circumstances: Secondary | ICD-10-CM

## 2016-11-22 ENCOUNTER — Ambulatory Visit (INDEPENDENT_AMBULATORY_CARE_PROVIDER_SITE_OTHER): Payer: 59 | Admitting: Family

## 2016-11-22 ENCOUNTER — Encounter: Payer: Self-pay | Admitting: Family

## 2016-11-22 VITALS — BP 146/96 | HR 71 | Temp 97.9°F | Ht 61.5 in | Wt 237.0 lb

## 2016-11-22 DIAGNOSIS — Z0000 Encounter for general adult medical examination without abnormal findings: Secondary | ICD-10-CM | POA: Diagnosis not present

## 2016-11-22 DIAGNOSIS — I10 Essential (primary) hypertension: Secondary | ICD-10-CM | POA: Diagnosis not present

## 2016-11-22 NOTE — Assessment & Plan Note (Signed)
Elevated. Advised patient to pick up a losartan when she leaves her appointment and to check blood pressure at home. Advised her of goal 140/90 and call us if running higher. Patient verbalized understanding.

## 2016-11-22 NOTE — Assessment & Plan Note (Addendum)
Up-to-date on colonoscopy.Colonoscopy due 2020 as patient is due every 5 years. Mammogram up-to-date. CBE performed today. Pelvic declined as no complaints.  Pap smear up-to-date. Deferred DEXA scan today and no increased risk. Screening labs including hepatitis C and HIV included today. Advised to get tetanus shot a local pharmacy. Discussed her frustration with weight and lack of exercise. When grandson goes back with his mother in 83 months, patient agreed to start exercise plan. I advised follow-up appointment at that time so we can discuss strategy, resources.

## 2016-11-22 NOTE — Progress Notes (Signed)
Pre visit review using our clinic review tool, if applicable. No additional management support is needed unless otherwise documented below in the visit note. 

## 2016-11-22 NOTE — Progress Notes (Signed)
Subjective:    Patient ID: Barbara Blake, female    DOB: Aug 06, 1956, 60 y.o.   MRN: [id number]  CC: Barbara Blake is a 60 y.o. female who presents today for physical exam.    HPI: HTN- Hadn't taken losartan for past 4 days. Needs to pick up medication. Denies exertional chest pain or pressure, numbness or tingling radiating to left arm or jaw, palpitations, dizziness, frequent headaches, changes in vision, or shortness of breath.   Not working out due to grandson living with her for next 6 months. Want to start work out plan in near year.  No gyn complaints.      Colorectal Cancer Screening: UTD 2015; h/o polpys; due 2020 Breast Cancer Screening: Mammogram UTD Cervical Cancer Screening: UTD 2015.  Bone Health screening/DEXA for 65+: No increased fracture risk. Defer screening at this time. Lung Cancer Screening: Doesn't have 30 year pack year history and age > 77 years.  Immunizations       Tetanus - UTD      Hepatitis C screening - Candidate for HIV Screening- Candidate for  Labs: Screening labs today. Exercise: Gets regular exercise.  Alcohol use: None Smoking/tobacco use: Nonsmoker.  Regular dental exams: In need of dental exam. Wears seat belt: Yes. Skin - no concern for skin lesions. Declines referral to derm for annual skin check  HISTORY:  Past Medical History:  Diagnosis Date  . Anxiety   . Asthma   . HTN (hypertension)   . Hypothyroidism   . Obesity     Past Surgical History:  Procedure Laterality Date  . APPENDECTOMY    . DILATION AND CURETTAGE OF UTERUS    . LIPOMA EXCISION     Family History  Problem Relation Age of Onset  . Alzheimer's disease Mother   . Hypertension Mother   . Cancer Mother 76    colon  . Lupus Daughter   . Heart attack Maternal Grandmother   . Breast cancer Other   . Breast cancer Maternal Aunt 98      ALLERGIES: Patient has no known allergies.  Current Outpatient Prescriptions on File Prior to Visit  Medication  Sig Dispense Refill  . albuterol (PROVENTIL HFA;VENTOLIN HFA) 108 (90 Base) MCG/ACT inhaler Inhale 2 puffs into the lungs every 6 (six) hours as needed for wheezing or shortness of breath. 1 Inhaler 0  . ALPRAZolam (XANAX) 0.25 MG tablet Take 1 tablet (0.25 mg total) by mouth 2 (two) times daily as needed. 60 tablet 3  . amLODipine (NORVASC) 2.5 MG tablet Take 1 tablet (2.5 mg total) by mouth daily. 90 tablet 3  . levothyroxine (SYNTHROID, LEVOTHROID) 175 MCG tablet Take 1 tablet (175 mcg total) by mouth daily before breakfast. 90 tablet 3  . losartan-hydrochlorothiazide (HYZAAR) 100-12.5 MG tablet Take 1 tablet by mouth daily. 90 tablet 3   No current facility-administered medications on file prior to visit.     Social History  Substance Use Topics  . Smoking status: Never Smoker  . Smokeless tobacco: Never Used  . Alcohol use No    Review of Systems  Constitutional: Negative for chills, fever and unexpected weight change.  HENT: Negative for congestion.   Respiratory: Negative for cough.   Cardiovascular: Negative for chest pain, palpitations and leg swelling.  Gastrointestinal: Negative for nausea and vomiting.  Musculoskeletal: Negative for arthralgias and myalgias.  Skin: Negative for rash.  Neurological: Negative for headaches.  Hematological: Negative for adenopathy.  Psychiatric/Behavioral: Negative for confusion.  Objective:    BP (!) 146/96   Pulse 71   Temp 97.9 F (36.6 C) (Oral)   Ht 5' 1.5" (1.562 m)   Wt 237 lb (107.5 kg)   SpO2 98%   BMI 44.06 kg/m   BP Readings from Last 3 Encounters:  11/22/16 (!) 146/96  09/20/16 (!) 146/96  06/30/16 (!) 162/98   Wt Readings from Last 3 Encounters:  11/22/16 237 lb (107.5 kg)  09/20/16 236 lb 6.4 oz (107.2 kg)  06/30/16 236 lb 12.8 oz (107.4 kg)    Physical Exam  Constitutional: She appears well-developed and well-nourished.  Eyes: Conjunctivae are normal.  Neck: No thyroid mass and no thyromegaly  present.  Cardiovascular: Normal rate, regular rhythm, normal heart sounds and normal pulses.   Pulmonary/Chest: Effort normal and breath sounds normal. She has no wheezes. She has no rhonchi. She has no rales. Right breast exhibits no inverted nipple, no mass, no nipple discharge, no skin change and no tenderness. Left breast exhibits no inverted nipple, no mass, no nipple discharge, no skin change and no tenderness. Breasts are symmetrical.  CBE performed.   Lymphadenopathy:       Head (right side): No submental, no submandibular, no tonsillar, no preauricular, no posterior auricular and no occipital adenopathy present.       Head (left side): No submental, no submandibular, no tonsillar, no preauricular, no posterior auricular and no occipital adenopathy present.    She has no cervical adenopathy.       Right cervical: No superficial cervical, no deep cervical and no posterior cervical adenopathy present.      Left cervical: No superficial cervical, no deep cervical and no posterior cervical adenopathy present.    She has no axillary adenopathy.  Neurological: She is alert.  Skin: Skin is warm and dry.  Psychiatric: She has a normal mood and affect. Her speech is normal and behavior is normal. Thought content normal.  Vitals reviewed.      Assessment & Plan:   Problem List Items Addressed This Visit      Cardiovascular and Mediastinum   Hypertension    Elevated. Advised patient to pick up a losartan when she leaves her appointment and to check blood pressure at home. Advised her of goal 140/90 and call us if running higher. Patient verbalized understanding.        Other   Routine general medical examination at a health care facility - Primary    Up-to-date on colonoscopy.Colonoscopy due 2020 as patient is due every 5 years. Mammogram up-to-date. CBE performed today. Pelvic declined as no complaints.  Pap smear up-to-date. Deferred DEXA scan today and no increased risk. Screening labs  including hepatitis C and HIV included today. Advised to get tetanus shot a local pharmacy. Discussed her frustration with weight and lack of exercise. When grandson goes back with his mother in 37 months, patient agreed to start exercise plan. I advised follow-up appointment at that time so we can discuss strategy, resources.      Relevant Orders   CBC with Differential/Platelet   Comprehensive metabolic panel   Hemoglobin A1c   Lipid panel   TSH   VITAMIN D 25 Hydroxy (Vit-D Deficiency, Fractures)   Hepatitis C antibody   HIV antibody       I am having Barbara Blake maintain her losartan-hydrochlorothiazide, levothyroxine, ALPRAZolam, amLODipine, and albuterol.   No orders of the defined types were placed in this encounter.   Return precautions given.   Risks, benefits,  and alternatives of the medications and treatment plan prescribed today were discussed, and patient expressed understanding.   Education regarding symptom management and diagnosis given to patient on AVS.   Continue to follow with Mable Paris, FNP for routine health maintenance.   Danelle Berry and I agreed with plan.   Mable Paris, FNP

## 2016-11-22 NOTE — Patient Instructions (Addendum)
Fasting lab appt Follow up 6 month to discuss weight loss strategy Health Maintenance, Female Introduction Adopting a healthy lifestyle and getting preventive care can go a long way to promote health and wellness. Talk with your health care provider about what schedule of regular examinations is right for you. This is a good chance for you to check in with your provider about disease prevention and staying healthy. In between checkups, there are plenty of things you can do on your own. Experts have done a lot of research about which lifestyle changes and preventive measures are most likely to keep you healthy. Ask your health care provider for more information. Weight and diet Eat a healthy diet  Be sure to include plenty of vegetables, fruits, low-fat dairy products, and lean protein.  Do not eat a lot of foods high in solid fats, added sugars, or salt.  Get regular exercise. This is one of the most important things you can do for your health.  Most adults should exercise for at least 150 minutes each week. The exercise should increase your heart rate and make you sweat (moderate-intensity exercise).  Most adults should also do strengthening exercises at least twice a week. This is in addition to the moderate-intensity exercise. Maintain a healthy weight  Body mass index (BMI) is a measurement that can be used to identify possible weight problems. It estimates body fat based on height and weight. Your health care provider can help determine your BMI and help you achieve or maintain a healthy weight.  For females 22 years of age and older:  A BMI below 18.5 is considered underweight.  A BMI of 18.5 to 24.9 is normal.  A BMI of 25 to 29.9 is considered overweight.  A BMI of 30 and above is considered obese. Watch levels of cholesterol and blood lipids  You should start having your blood tested for lipids and cholesterol at 59 years of age, then have this test every 5 years.  You may  need to have your cholesterol levels checked more often if:  Your lipid or cholesterol levels are high.  You are older than 59 years of age.  You are at high risk for heart disease. Cancer screening Lung Cancer  Lung cancer screening is recommended for adults 59-50 years old who are at high risk for lung cancer because of a history of smoking.  A yearly low-dose CT scan of the lungs is recommended for people who:  Currently smoke.  Have quit within the past 15 years.  Have at least a 30-pack-year history of smoking. A pack year is smoking an average of one pack of cigarettes a day for 1 year.  Yearly screening should continue until it has been 15 years since you quit.  Yearly screening should stop if you develop a health problem that would prevent you from having lung cancer treatment. Breast Cancer  Practice breast self-awareness. This means understanding how your breasts normally appear and feel.  It also means doing regular breast self-exams. Let your health care provider know about any changes, no matter how small.  If you are in your 20s or 30s, you should have a clinical breast exam (CBE) by a health care provider every 1-3 years as part of a regular health exam.  If you are 86 or older, have a CBE every year. Also consider having a breast X-ray (mammogram) every year.  If you have a family history of breast cancer, talk to your health care provider about  genetic screening.  If you are at high risk for breast cancer, talk to your health care provider about having an MRI and a mammogram every year.  Breast cancer gene (BRCA) assessment is recommended for women who have family members with BRCA-related cancers. BRCA-related cancers include:  Breast.  Ovarian.  Tubal.  Peritoneal cancers.  Results of the assessment will determine the need for genetic counseling and BRCA1 and BRCA2 testing. Cervical Cancer  Your health care provider may recommend that you be  screened regularly for cancer of the pelvic organs (ovaries, uterus, and vagina). This screening involves a pelvic examination, including checking for microscopic changes to the surface of your cervix (Pap test). You may be encouraged to have this screening done every 3 years, beginning at age 86.  For women ages 44-65, health care providers may recommend pelvic exams and Pap testing every 3 years, or they may recommend the Pap and pelvic exam, combined with testing for human papilloma virus (HPV), every 5 years. Some types of HPV increase your risk of cervical cancer. Testing for HPV may also be done on women of any age with unclear Pap test results.  Other health care providers may not recommend any screening for nonpregnant women who are considered low risk for pelvic cancer and who do not have symptoms. Ask your health care provider if a screening pelvic exam is right for you.  If you have had past treatment for cervical cancer or a condition that could lead to cancer, you need Pap tests and screening for cancer for at least 20 years after your treatment. If Pap tests have been discontinued, your risk factors (such as having a new sexual partner) need to be reassessed to determine if screening should resume. Some women have medical problems that increase the chance of getting cervical cancer. In these cases, your health care provider may recommend more frequent screening and Pap tests. Colorectal Cancer  This type of cancer can be detected and often prevented.  Routine colorectal cancer screening usually begins at 59 years of age and continues through 59 years of age.  Your health care provider may recommend screening at an earlier age if you have risk factors for colon cancer.  Your health care provider may also recommend using home test kits to check for hidden blood in the stool.  A small camera at the end of a tube can be used to examine your colon directly (sigmoidoscopy or colonoscopy).  This is done to check for the earliest forms of colorectal cancer.  Routine screening usually begins at age 31.  Direct examination of the colon should be repeated every 5-10 years through 59 years of age. However, you may need to be screened more often if early forms of precancerous polyps or small growths are found. Skin Cancer  Check your skin from head to toe regularly.  Tell your health care provider about any new moles or changes in moles, especially if there is a change in a mole's shape or color.  Also tell your health care provider if you have a mole that is larger than the size of a pencil eraser.  Always use sunscreen. Apply sunscreen liberally and repeatedly throughout the day.  Protect yourself by wearing long sleeves, pants, a wide-brimmed hat, and sunglasses whenever you are outside. Heart disease, diabetes, and high blood pressure  High blood pressure causes heart disease and increases the risk of stroke. High blood pressure is more likely to develop in:  People who have blood  pressure in the high end of the normal range (130-139/85-89 mm Hg).  People who are overweight or obese.  People who are African American.  If you are 67-45 years of age, have your blood pressure checked every 3-5 years. If you are 45 years of age or older, have your blood pressure checked every year. You should have your blood pressure measured twice-once when you are at a hospital or clinic, and once when you are not at a hospital or clinic. Record the average of the two measurements. To check your blood pressure when you are not at a hospital or clinic, you can use:  An automated blood pressure machine at a pharmacy.  A home blood pressure monitor.  If you are between 49 years and 79 years old, ask your health care provider if you should take aspirin to prevent strokes.  Have regular diabetes screenings. This involves taking a blood sample to check your fasting blood sugar level.  If you  are at a normal weight and have a low risk for diabetes, have this test once every three years after 59 years of age.  If you are overweight and have a high risk for diabetes, consider being tested at a younger age or more often. Preventing infection Hepatitis B  If you have a higher risk for hepatitis B, you should be screened for this virus. You are considered at high risk for hepatitis B if:  You were born in a country where hepatitis B is common. Ask your health care provider which countries are considered high risk.  Your parents were born in a high-risk country, and you have not been immunized against hepatitis B (hepatitis B vaccine).  You have HIV or AIDS.  You use needles to inject street drugs.  You live with someone who has hepatitis B.  You have had sex with someone who has hepatitis B.  You get hemodialysis treatment.  You take certain medicines for conditions, including cancer, organ transplantation, and autoimmune conditions. Hepatitis C  Blood testing is recommended for:  Everyone born from [birth year] through 1964.  Anyone with known risk factors for hepatitis C. Sexually transmitted infections (STIs)  You should be screened for sexually transmitted infections (STIs) including gonorrhea and chlamydia if:  You are sexually active and are younger than 60 years of age.  You are older than 59 years of age and your health care provider tells you that you are at risk for this type of infection.  Your sexual activity has changed since you were last screened and you are at an increased risk for chlamydia or gonorrhea. Ask your health care provider if you are at risk.  If you do not have HIV, but are at risk, it may be recommended that you take a prescription medicine daily to prevent HIV infection. This is called pre-exposure prophylaxis (PrEP). You are considered at risk if:  You are sexually active and do not regularly use condoms or know the HIV status of your  partner(s).  You take drugs by injection.  You are sexually active with a partner who has HIV. Talk with your health care provider about whether you are at high risk of being infected with HIV. If you choose to begin PrEP, you should first be tested for HIV. You should then be tested every 3 months for as long as you are taking PrEP. Pregnancy  If you are premenopausal and you may become pregnant, ask your health care provider about preconception counseling.  If  you may become pregnant, take 400 to 800 micrograms (mcg) of folic acid every day.  If you want to prevent pregnancy, talk to your health care provider about birth control (contraception). Osteoporosis and menopause  Osteoporosis is a disease in which the bones lose minerals and strength with aging. This can result in serious bone fractures. Your risk for osteoporosis can be identified using a bone density scan.  If you are 65 years of age or older, or if you are at risk for osteoporosis and fractures, ask your health care provider if you should be screened.  Ask your health care provider whether you should take a calcium or vitamin D supplement to lower your risk for osteoporosis.  Menopause may have certain physical symptoms and risks.  Hormone replacement therapy may reduce some of these symptoms and risks. Talk to your health care provider about whether hormone replacement therapy is right for you. Follow these instructions at home:  Schedule regular health, dental, and eye exams.  Stay current with your immunizations.  Do not use any tobacco products including cigarettes, chewing tobacco, or electronic cigarettes.  If you are pregnant, do not drink alcohol.  If you are breastfeeding, limit how much and how often you drink alcohol.  Limit alcohol intake to no more than 1 drink per day for nonpregnant women. One drink equals 12 ounces of beer, 5 ounces of wine, or 1 ounces of hard liquor.  Do not use street  drugs.  Do not share needles.  Ask your health care provider for help if you need support or information about quitting drugs.  Tell your health care provider if you often feel depressed.  Tell your health care provider if you have ever been abused or do not feel safe at home. This information is not intended to replace advice given to you by your health care provider. Make sure you discuss any questions you have with your health care provider. Document Released: 06/14/2011 Document Revised: 05/06/2016 Document Reviewed: 09/02/2015  2017 [employer]

## 2016-11-24 ENCOUNTER — Other Ambulatory Visit (INDEPENDENT_AMBULATORY_CARE_PROVIDER_SITE_OTHER): Payer: 59

## 2016-11-24 DIAGNOSIS — Z0000 Encounter for general adult medical examination without abnormal findings: Secondary | ICD-10-CM

## 2016-11-24 LAB — COMPREHENSIVE METABOLIC PANEL
ALT: 18 U/L (ref 0–35)
AST: 16 U/L (ref 0–37)
Albumin: 4.1 g/dL (ref 3.5–5.2)
Alkaline Phosphatase: 77 U/L (ref 39–117)
BUN: 18 mg/dL (ref 6–23)
CHLORIDE: 108 meq/L (ref 96–112)
CO2: 28 meq/L (ref 19–32)
CREATININE: 0.74 mg/dL (ref 0.40–1.20)
Calcium: 9.1 mg/dL (ref 8.4–10.5)
GFR: 85.29 mL/min (ref >60.00)
Glucose, Bld: 90 mg/dL (ref 70–99)
POTASSIUM: 3.8 meq/L (ref 3.5–5.1)
SODIUM: 143 meq/L (ref 135–145)
Total Bilirubin: 1.3 mg/dL — ABNORMAL HIGH (ref 0.2–1.2)
Total Protein: 6.8 g/dL (ref 6.0–8.3)

## 2016-11-24 LAB — LIPID PANEL
Cholesterol: 166 mg/dL (ref 0–200)
HDL: 54.4 mg/dL (ref >39.00)
LDL CALC: 94 mg/dL (ref 0–99)
NONHDL: 111.88
Total CHOL/HDL Ratio: 3
Triglycerides: 90 mg/dL (ref 0.0–149.0)
VLDL: 18 mg/dL (ref 0.0–40.0)

## 2016-11-24 LAB — CBC WITH DIFFERENTIAL/PLATELET
BASOS PCT: 0.8 % (ref 0.0–3.0)
Basophils Absolute: 0.1 10*3/uL (ref 0.0–0.1)
EOS ABS: 0.1 10*3/uL (ref 0.0–0.7)
EOS PCT: 1.3 % (ref 0.0–5.0)
HCT: 40.6 % (ref 36.0–46.0)
Hemoglobin: 13.6 g/dL (ref 12.0–15.0)
LYMPHS ABS: 2.4 10*3/uL (ref 0.7–4.0)
Lymphocytes Relative: 33.8 % (ref 12.0–46.0)
MCHC: 33.6 g/dL (ref 30.0–36.0)
MCV: 90.8 fl (ref 78.0–100.0)
MONO ABS: 0.4 10*3/uL (ref 0.1–1.0)
Monocytes Relative: 5.5 % (ref 3.0–12.0)
NEUTROS ABS: 4.1 10*3/uL (ref 1.4–7.7)
NEUTROS PCT: 58.6 % (ref 43.0–77.0)
PLATELETS: 238 10*3/uL (ref 150.0–400.0)
RBC: 4.47 Mil/uL (ref 3.87–5.11)
RDW: 14.5 % (ref 11.5–15.5)
WBC: 6.9 10*3/uL (ref 4.0–10.5)

## 2016-11-24 LAB — TSH: TSH: 1.03 u[IU]/mL (ref 0.35–4.50)

## 2016-11-24 LAB — VITAMIN D 25 HYDROXY (VIT D DEFICIENCY, FRACTURES): VITD: 15.56 ng/mL — ABNORMAL LOW (ref 30.00–100.00)

## 2016-11-24 LAB — HEMOGLOBIN A1C: HEMOGLOBIN A1C: 5.5 % (ref 4.6–6.5)

## 2016-11-25 LAB — HIV ANTIBODY (ROUTINE TESTING W REFLEX): HIV: NONREACTIVE

## 2016-11-25 LAB — HEPATITIS C ANTIBODY: HCV Ab: NEGATIVE

## 2017-01-14 ENCOUNTER — Telehealth: Payer: Self-pay | Admitting: Family

## 2017-01-14 NOTE — Telephone Encounter (Signed)
ALPRAZolam (XANAX) 0.25 MG tablet take one tablet by mouth twice daily as needed. Qty 30 refills 3

## 2017-01-14 NOTE — Telephone Encounter (Signed)
Refill request for Xanax, last seen [id number], last filled [id number].  Please advise.

## 2017-01-17 ENCOUNTER — Telehealth: Payer: Self-pay | Admitting: Family

## 2017-01-17 DIAGNOSIS — F419 Anxiety disorder, unspecified: Secondary | ICD-10-CM

## 2017-01-17 NOTE — Telephone Encounter (Signed)
Call pt-  Please get some clarity on how often using xanax as not discussed in Dec when we met  How many times per week?  What situations ? Sleep? Panic attacks?  Ever been on a daily medication for generalized anxiety?

## 2017-01-19 NOTE — Telephone Encounter (Signed)
Left message for patient to return call back.  

## 2017-01-20 MED ORDER — ALPRAZOLAM 0.25 MG PO TABS: 0.2500 mg | ORAL_TABLET | Freq: Every day | ORAL | 0 refills | Status: DC | PRN

## 2017-01-20 NOTE — Telephone Encounter (Signed)
Xanax is taken for once a month for panic attacks.  Patient has never been on any other medications for anxiety.

## 2017-05-10 ENCOUNTER — Telehealth: Payer: Self-pay | Admitting: Family

## 2017-05-10 MED ORDER — LOSARTAN POTASSIUM-HCTZ 100-12.5 MG PO TABS: 1.0000 | ORAL_TABLET | Freq: Every day | ORAL | 3 refills | Status: DC

## 2017-05-10 NOTE — Telephone Encounter (Signed)
Medication has been refilled.

## 2017-05-10 NOTE — Telephone Encounter (Signed)
Pt called and is requesting a refill on her losartan-hydrochlorothiazide (HYZAAR) 100-12.5 MG tablet. Pt states that she has been waiting over a month to get this filled. Please advise, thank you!  Pharmacy - [facility], [place]

## 2017-10-14 ENCOUNTER — Other Ambulatory Visit: Payer: Self-pay | Admitting: Family

## 2017-10-14 DIAGNOSIS — F419 Anxiety disorder, unspecified: Secondary | ICD-10-CM

## 2017-10-14 NOTE — Telephone Encounter (Signed)
Please advise for refill, last OV was 11/22/2016, last refill was 01/20/17, #30 with no refills, thanks

## 2017-10-17 ENCOUNTER — Telehealth: Payer: Self-pay | Admitting: Family

## 2017-10-17 NOTE — Telephone Encounter (Signed)
Call pt  I havent seen her since 11/2016  She needs follow every 6 months for controlled substance refills  Please advise f/u here or with one of colleagues- [place] etc. They can refill and discuss anxiety in the visit

## 2017-10-17 NOTE — Telephone Encounter (Signed)
Patient has been informed. Patient stated she has appointment [phone]

## 2017-10-26 ENCOUNTER — Other Ambulatory Visit: Payer: Self-pay

## 2017-10-26 MED ORDER — LEVOTHYROXINE SODIUM 175 MCG PO TABS: 175.0000 ug | ORAL_TABLET | Freq: Every day | ORAL | 3 refills | Status: DC

## 2017-11-15 ENCOUNTER — Other Ambulatory Visit: Payer: Self-pay | Admitting: Family

## 2017-11-15 DIAGNOSIS — Z1231 Encounter for screening mammogram for malignant neoplasm of breast: Secondary | ICD-10-CM

## 2017-11-23 ENCOUNTER — Encounter: Payer: Self-pay | Admitting: Family

## 2017-11-25 ENCOUNTER — Other Ambulatory Visit (HOSPITAL_COMMUNITY)
Admission: RE | Admit: 2017-11-25 | Discharge: 2017-11-25 | Disposition: A | Discharge status: Home / Self Care | Payer: 59 | Source: Ambulatory Visit | Attending: Internal Medicine | Admitting: Internal Medicine

## 2017-11-25 ENCOUNTER — Ambulatory Visit (INDEPENDENT_AMBULATORY_CARE_PROVIDER_SITE_OTHER): Payer: 59 | Admitting: Internal Medicine

## 2017-11-25 DIAGNOSIS — K219 Gastro-esophageal reflux disease without esophagitis: Secondary | ICD-10-CM | POA: Diagnosis not present

## 2017-11-25 DIAGNOSIS — R339 Retention of urine, unspecified: Secondary | ICD-10-CM | POA: Diagnosis not present

## 2017-11-25 DIAGNOSIS — M2550 Pain in unspecified joint: Secondary | ICD-10-CM | POA: Diagnosis not present

## 2017-11-25 DIAGNOSIS — R490 Dysphonia: Secondary | ICD-10-CM

## 2017-11-25 DIAGNOSIS — Z23 Encounter for immunization: Secondary | ICD-10-CM | POA: Diagnosis not present

## 2017-11-25 DIAGNOSIS — M5441 Lumbago with sciatica, right side: Secondary | ICD-10-CM

## 2017-11-25 DIAGNOSIS — R350 Frequency of micturition: Secondary | ICD-10-CM

## 2017-11-25 DIAGNOSIS — E559 Vitamin D deficiency, unspecified: Secondary | ICD-10-CM

## 2017-11-25 DIAGNOSIS — J4520 Mild intermittent asthma, uncomplicated: Secondary | ICD-10-CM

## 2017-11-25 DIAGNOSIS — Z124 Encounter for screening for malignant neoplasm of cervix: Secondary | ICD-10-CM | POA: Diagnosis not present

## 2017-11-25 DIAGNOSIS — E039 Hypothyroidism, unspecified: Secondary | ICD-10-CM | POA: Diagnosis not present

## 2017-11-25 DIAGNOSIS — Z0000 Encounter for general adult medical examination without abnormal findings: Secondary | ICD-10-CM

## 2017-11-25 DIAGNOSIS — F419 Anxiety disorder, unspecified: Secondary | ICD-10-CM

## 2017-11-25 DIAGNOSIS — M5442 Lumbago with sciatica, left side: Secondary | ICD-10-CM

## 2017-11-25 DIAGNOSIS — Z1159 Encounter for screening for other viral diseases: Secondary | ICD-10-CM

## 2017-11-25 DIAGNOSIS — I10 Essential (primary) hypertension: Secondary | ICD-10-CM | POA: Diagnosis not present

## 2017-11-25 DIAGNOSIS — R0982 Postnasal drip: Secondary | ICD-10-CM

## 2017-11-25 DIAGNOSIS — L309 Dermatitis, unspecified: Secondary | ICD-10-CM | POA: Diagnosis not present

## 2017-11-25 MED ORDER — AMLODIPINE BESYLATE 2.5 MG PO TABS: 2.5000 mg | ORAL_TABLET | Freq: Every day | ORAL | 3 refills | Status: DC

## 2017-11-25 MED ORDER — LOSARTAN POTASSIUM-HCTZ 100-12.5 MG PO TABS: 1.0000 | ORAL_TABLET | Freq: Every day | ORAL | 3 refills | Status: DC

## 2017-11-25 MED ORDER — LEVOTHYROXINE SODIUM 175 MCG PO TABS: 175.0000 ug | ORAL_TABLET | Freq: Every day | ORAL | 3 refills | Status: DC

## 2017-11-25 MED ORDER — METHOCARBAMOL 500 MG PO TABS: 500.0000 mg | ORAL_TABLET | Freq: Every evening | ORAL | 0 refills | Status: DC | PRN

## 2017-11-25 MED ORDER — ALPRAZOLAM 0.25 MG PO TABS: 0.2500 mg | ORAL_TABLET | Freq: Every day | ORAL | 2 refills | Status: DC | PRN

## 2017-11-25 MED ORDER — TRIAMCINOLONE ACETONIDE 0.1 % EX CREA: 1.0000 "application " | TOPICAL_CREAM | Freq: Two times a day (BID) | CUTANEOUS | 0 refills | Status: DC

## 2017-11-25 MED ORDER — MELOXICAM 15 MG PO TABS: 15.0000 mg | ORAL_TABLET | Freq: Every day | ORAL | 0 refills | Status: DC | PRN

## 2017-11-25 NOTE — Patient Instructions (Signed)
Try Zyrtec every night for postnasal drip Try Zantac for heart burn  F/u in 2-3 weeks Schedule low back Xray next Wednesday with labs    Heartburn Heartburn is a type of pain or discomfort that can happen in the throat or chest. It is often described as a burning pain. It may also cause a bad taste in the mouth. Heartburn may feel worse when you lie down or bend over, and it is often worse at night. Heartburn may be caused by stomach contents that move back up into the esophagus (reflux). Follow these instructions at home: Take these actions to decrease your discomfort and to help avoid complications. Diet  Follow a diet as recommended by your health care provider. This may involve avoiding foods and drinks such as: ? Coffee and tea (with or without caffeine). ? Drinks that contain alcohol. ? Energy drinks and sports drinks. ? Carbonated drinks or sodas. ? Chocolate and cocoa. ? Peppermint and mint flavorings. ? Garlic and onions. ? Horseradish. ? Spicy and acidic foods, including peppers, chili powder, curry powder, vinegar, hot sauces, and barbecue sauce. ? Citrus fruit juices and citrus fruits, such as oranges, lemons, and limes. ? Tomato-based foods, such as red sauce, chili, salsa, and pizza with red sauce. ? Fried and fatty foods, such as donuts, french fries, potato chips, and high-fat dressings. ? High-fat meats, such as hot dogs and fatty cuts of red and white meats, such as rib eye steak, sausage, ham, and bacon. ? High-fat dairy items, such as whole milk, butter, and cream cheese.  Eat small, frequent meals instead of large meals.  Avoid drinking large amounts of liquid with your meals.  Avoid eating meals during the 2-3 hours before bedtime.  Avoid lying down right after you eat.  Do not exercise right after you eat. General instructions  Pay attention to any changes in your symptoms.  Take over-the-counter and prescription medicines only as told by your health  care provider. Do not take aspirin, ibuprofen, or other NSAIDs unless your health care provider told you to do so.  Do not use any tobacco products, including cigarettes, chewing tobacco, and e-cigarettes. If you need help quitting, ask your health care provider.  Wear loose-fitting clothing. Do not wear anything tight around your waist that causes pressure on your abdomen.  Raise (elevate) the head of your bed about 6 inches (15 cm).  Try to reduce your stress, such as with yoga or meditation. If you need help reducing stress, ask your health care provider.  If you are overweight, reduce your weight to an amount that is healthy for you. Ask your health care provider for guidance about a safe weight loss goal.  Keep all follow-up visits as told by your health care provider. This is important. Contact a health care provider if:  You have new symptoms.  You have unexplained weight loss.  You have difficulty swallowing, or it hurts to swallow.  You have wheezing or a persistent cough.  Your symptoms do not improve with treatment.  You have frequent heartburn for more than two weeks. Get help right away if:  You have pain in your arms, neck, jaw, teeth, or back.  You feel sweaty, dizzy, or light-headed.  You have chest pain or shortness of breath.  You vomit and your vomit looks like blood or coffee grounds.  Your stool is bloody or black. This information is not intended to replace advice given to you by your health care provider. Make  sure you discuss any questions you have with your health care provider. Document Released: 04/17/2009 Document Revised: 05/06/2016 Document Reviewed: 03/26/2015 Elsevier Interactive Patient Education  2017 [place] for Gastroesophageal Reflux Disease, Adult When you have gastroesophageal reflux disease (GERD), the foods you eat and your eating habits are very important. Choosing the right foods can help ease your  discomfort. What guidelines do I need to follow?  Choose fruits, vegetables, whole grains, and low-fat dairy products.  Choose low-fat meat, fish, and poultry.  Limit fats such as oils, salad dressings, butter, nuts, and avocado.  Keep a food diary. This helps you identify foods that cause symptoms.  Avoid foods that cause symptoms. These may be different for everyone.  Eat small meals often instead of 3 large meals a day.  Eat your meals slowly, in a place where you are relaxed.  Limit fried foods.  Cook foods using methods other than frying.  Avoid drinking alcohol.  Avoid drinking large amounts of liquids with your meals.  Avoid bending over or lying down until 2-3 hours after eating. What foods are not recommended? These are some foods and drinks that may make your symptoms worse: Vegetables Tomatoes. Tomato juice. Tomato and spaghetti sauce. Chili peppers. Onion and garlic. Horseradish. Fruits Oranges, grapefruit, and lemon (fruit and juice). Meats High-fat meats, fish, and poultry. This includes hot dogs, ribs, ham, sausage, salami, and bacon. Dairy Whole milk and chocolate milk. Sour cream. Cream. Butter. Ice cream. Cream cheese. Drinks Coffee and tea. Bubbly (carbonated) drinks or energy drinks. Condiments Hot sauce. Barbecue sauce. Sweets/Desserts Chocolate and cocoa. Donuts. Peppermint and spearmint. Fats and Oils High-fat foods. This includes [place] fries and potato chips. Other Vinegar. Strong spices. This includes black pepper, white pepper, red pepper, cayenne, curry powder, cloves, ginger, and chili powder. The items listed above may not be a complete list of foods and drinks to avoid. Contact your dietitian for more information. This information is not intended to replace advice given to you by your health care provider. Make sure you discuss any questions you have with your health care provider. Document Released: 05/30/2012 Document Revised:  05/06/2016 Document Reviewed: 10/03/2013 Elsevier Interactive Patient Education  2017 Else[employer] Inc[employer]  Back Pain, Adult Back pain is very common in adults.The cause of back pain is rarely dangerous and the pain often gets better over time.The cause of your back pain may not be known. Some common causes of back pain include:  Strain of the muscles or ligaments supporting the spine.  Wear and tear (degeneration) of the spinal disks.  Arthritis.  Direct injury to the back.  For many people, back pain may return. Since back pain is rarely dangerous, most people can learn to manage this condition on their own. Follow these instructions at home: Watch your back pain for any changes. The following actions may help to lessen any discomfort you are feeling:  Remain active. It is stressful on your back to sit or stand in one place for long periods of time. Do not sit, drive, or stand in one place for more than 30 minutes at a time. Take short walks on even surfaces as soon as you are able.Try to increase the length of time you walk each day.  Exercise regularly as directed by your health care provider. Exercise helps your back heal faster. It also helps avoid future injury by keeping your muscles strong and flexible.  Do not stay in bed.Resting more than 1-2 days can delay  your recovery.  Pay attention to your body when you bend and lift. The most comfortable positions are those that put less stress on your recovering back. Always use proper lifting techniques, including: ? Bending your knees. ? Keeping the load close to your body. ? Avoiding twisting.  Find a comfortable position to sleep. Use a firm mattress and lie on your side with your knees slightly bent. If you lie on your back, put a pillow under your knees.  Avoid feeling anxious or stressed.Stress increases muscle tension and can worsen back pain.It is important to recognize when you are anxious or stressed and learn ways to  manage it, such as with exercise.  Take medicines only as directed by your health care provider. Over-the-counter medicines to reduce pain and inflammation are often the most helpful.Your health care provider may prescribe muscle relaxant drugs.These medicines help dull your pain so you can more quickly return to your normal activities and healthy exercise.  Apply ice to the injured area: ? Put ice in a plastic bag. ? Place a towel between your skin and the bag. ? Leave the ice on for 20 minutes, 2-3 times a day for the first 2-3 days. After that, ice and heat may be alternated to reduce pain and spasms.  Maintain a healthy weight. Excess weight puts extra stress on your back and makes it difficult to maintain good posture.  Contact a health care provider if:  You have pain that is not relieved with rest or medicine.  You have increasing pain going down into the legs or buttocks.  You have pain that does not improve in one week.  You have night pain.  You lose weight.  You have a fever or chills. Get help right away if:  You develop new bowel or bladder control problems.  You have unusual weakness or numbness in your arms or legs.  You develop nausea or vomiting.  You develop abdominal pain.  You feel faint. This information is not intended to replace advice given to you by your health care provider. Make sure you discuss any questions you have with your health care provider. Document Released: 11/29/2005 Document Revised: 04/08/2016 Document Reviewed: 04/02/2014 Elsevier Interactive Patient Education  2017 Else[employer] Inc.  Hoarseness Hoarseness is any abnormal change in your voice.Hoarseness can make it difficult to speak. Your voice may sound raspy, breathy, or strained. Hoarseness is caused by a problem with the vocal cords. The vocal cords are two bands of tissue inside your voice box (larynx). When you speak, your vocal cords move back and forth to create sound. The  surfaces of your vocal cords need to be smooth for your voice to sound clear. Swelling or lumps on the vocal cords can cause hoarseness. Common causes of vocal cord problems include:  Upper airway infection.  A long-term cough.  Straining or overusing your voice.  Smoking.  Allergies.  Vocal cord growths.  Stomach acids that flow up from your stomach and irritate your vocal cords (gastroesophageal reflux).  Follow these instructions at home: Watch your condition for any changes. To ease any discomfort that you feel:  Rest your voice. Do not whisper. Whispering can cause muscle strain.  Do not speak in a loud or harsh voice that makes your hoarseness worse.  Do not use any tobacco products, including cigarettes, chewing tobacco, or electronic cigarettes. If you need help quitting, ask your health care provider.  Avoid secondhand smoke.  Do not eat foods that give you heartburn.  Heartburn can make gastroesophageal reflux worse.  Do not drink coffee.  Do not drink alcohol.  Drink enough fluids to keep your urine clear or pale yellow.  Use a humidifier if the air in your home is dry.  Contact a health care provider if:  You have hoarseness that lasts longer than 3 weeks.  You almost lose or completelylose your voice for longer than 3 days.  You have pain when you swallow or try to talk.  You feel a lump in your neck. Get help right away if:  You have trouble swallowing.  You feel as though you are choking when you swallow.  You cough up blood or vomit blood.  You have trouble breathing. This information is not intended to replace advice given to you by your health care provider. Make sure you discuss any questions you have with your health care provider. Document Released: 11/12/2005 Document Revised: 05/06/2016 Document Reviewed: 11/20/2014 Elsevier Interactive Patient Education  [employer].

## 2017-11-28 ENCOUNTER — Encounter: Payer: Self-pay | Admitting: Internal Medicine

## 2017-11-28 ENCOUNTER — Telehealth: Payer: Self-pay | Admitting: Internal Medicine

## 2017-11-28 DIAGNOSIS — M5441 Lumbago with sciatica, right side: Secondary | ICD-10-CM | POA: Insufficient documentation

## 2017-11-28 DIAGNOSIS — J45909 Unspecified asthma, uncomplicated: Secondary | ICD-10-CM

## 2017-11-28 DIAGNOSIS — R490 Dysphonia: Secondary | ICD-10-CM | POA: Insufficient documentation

## 2017-11-28 DIAGNOSIS — R0982 Postnasal drip: Secondary | ICD-10-CM | POA: Insufficient documentation

## 2017-11-28 DIAGNOSIS — K219 Gastro-esophageal reflux disease without esophagitis: Secondary | ICD-10-CM | POA: Insufficient documentation

## 2017-11-28 DIAGNOSIS — M5442 Lumbago with sciatica, left side: Secondary | ICD-10-CM

## 2017-11-28 HISTORY — DX: Unspecified asthma, uncomplicated: J45.909

## 2017-11-28 NOTE — Progress Notes (Signed)
Chief Complaint  Patient presents with  . Annual Exam   Annual physical  1. C/o rash diffuse to body no sx's nothing tried  2. HTN uncontrolled has not taking Norvasc 2.5 in a while encourage to take with other medication  3. C/o intermittent low back pain took a friends Mobic 15 mg and Robaxin which helped back never been imaged. C/o other joint pain (knees, wrists, elbows, shoulders). Pain worse in cold and am. Tried PT 2-3 years ago and had sciatica sx's in b/l legs  4. C/o not emptying bladder at times  5. Asthma controlled except when exposed to cold weather worse and using prn Albuterol inhaler used Flovent in the past someone elses and helped.    Review of Systems  Constitutional: Negative for weight loss.  HENT:       +hoarse voice h/o thyroid US and saw Dr. Pryor Ochoa dx'ed PND  Respiratory: Positive for cough and shortness of breath.        Sob with asthma flares  Cardiovascular: Negative for chest pain.       +breast lumps unchanged told fatty tumor left breast   Gastrointestinal: Positive for heartburn. Negative for abdominal pain.  Genitourinary: Positive for frequency.       +urinary retention   Musculoskeletal: Positive for back pain and joint pain.  Skin: Positive for rash.  Neurological: Negative for headaches.  Psychiatric/Behavioral: The patient is nervous/anxious.    Past Medical History:  Diagnosis Date  . Anxiety   . Asthma   . HTN (hypertension)   . Hypothyroidism   . Obesity    Past Surgical History:  Procedure Laterality Date  . APPENDECTOMY    . DILATION AND CURETTAGE OF UTERUS    . LIPOMA EXCISION     Family History  Problem Relation Age of Onset  . Alzheimer's disease Mother   . Hypertension Mother   . Cancer Mother 68       colon  . Lupus Daughter   . Heart attack Maternal Grandmother   . Breast cancer Other   . Breast cancer Maternal Aunt 37   Social History   Socioeconomic History  . Marital status: Married    Spouse name: Not on  file  . Number of children: 2  . Years of education: Not on file  . Highest education level: Not on file  Social Needs  . Financial resource strain: Not on file  . Food insecurity - worry: Not on file  . Food insecurity - inability: Not on file  . Transportation needs - medical: Not on file  . Transportation needs - non-medical: Not on file  Occupational History  . 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 Smoking status: Never Smoker  . Smokeless tobacco: Never Used  Substance and Sexual Activity  . Alcohol use: No    Alcohol/week: 0.0 oz  . Drug use: No  . Sexual activity: Not on file  Other Topics Concern  . Not on file  Social History Narrative   Lives in [place].   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    From [facility]   As of 11/2017 married 44 years    2 kids       Regular Exercise -  Not at this time   Daily Caffeine Use:  1 cup hot tea daily         Current Meds  Medication Sig  . naproxen sodium (ALEVE) 220  MG tablet Take 220 mg by mouth daily as needed.   No Known Allergies No results found for this or any previous visit (from the past 2160 hour(s)). Objective  There is no height or weight on file to calculate BMI. Wt Readings from Last 3 Encounters:  11/22/16 237 lb (107.5 kg)  09/20/16 236 lb 6.4 oz (107.2 kg)  06/30/16 236 lb 12.8 oz (107.4 kg)   Temp Readings from Last 3 Encounters:  11/22/16 97.9 F (36.6 C) (Oral)  09/20/16 98.4 F (36.9 C) (Oral)  02/20/16 98 F (36.7 C) (Oral)   BP Readings from Last 3 Encounters:  11/22/16 (!) 146/96  09/20/16 (!) 146/96  06/30/16 (!) 162/98   Pulse Readings from Last 3 Encounters:  11/22/16 71  09/20/16 81  06/30/16 74  O2 sat 98% room air   Physical Exam  Constitutional: She is oriented to person, place, and time and well-developed, well-nourished, and in no distress.  HENT:  Head: Normocephalic and atraumatic.  Mouth/Throat: Oropharynx is clear and moist and  mucous membranes are normal.  Eyes: Conjunctivae are normal. Pupils are equal, round, and reactive to light.  Cardiovascular: Normal rate, regular rhythm and normal heart sounds.  No murmur heard. Neg leg edema b/l  Pulmonary/Chest: Effort normal and breath sounds normal. Right breast exhibits no inverted nipple, no mass, no nipple discharge, no skin change and no tenderness. Left breast exhibits no inverted nipple, no mass, no nipple discharge, no skin change and no tenderness. Breasts are symmetrical.  Small cyst vs lipoma left breast unchanged per pt   Abdominal: Soft. Bowel sounds are normal. There is no tenderness.  Genitourinary: Vagina normal, uterus normal, cervix normal, right adnexa normal, left adnexa normal and vulva normal.  Neurological: She is alert and oriented to person, place, and time. She has normal motor skills. Gait normal. Gait normal.  Skin: Skin is warm and dry.  Erythematous areas to skin   Psychiatric: Mood, memory, affect and judgment normal.  Nursing note and vitals reviewed.   Assessment   1. Joint pain and low back pain with b/l sciatica  2. HTN uncontrolled  3. Urinary frequency and retention  4. Anxiety 5. Asthma controlled, intermittent trigger cold weather  6. Rash ? Eczema  7. Intermittent GERD, h/o Post nasal drip and voice hoarseness  8. Vit D def  9. HM/annual wellness   Plan   1.  Check labs CMET, CBC, UA, lipid, TSH, T4, T3, UA, culture, hep B, vit D, RA labs  Xray low back  Prn Mobic and robaxin not to take with aleve 2.  Advised pt take norvasc 2.5 mg qd with hyzaar 100-12.5 mg qd  3. UA and culture, bladder scan in future  4.  Refilled Xanax prn 5. Cont prn Albuterol. Has tried Flovent in the past and worked  6.  Trial of TMC no face, underarms, groin  7. rec OTC zantac and Zyrtec prn  If hoarse voice does not get better f/u Dr. Pryor Ochoa No US thyroid I can see in chart per pt had in the past   8.  Check vitamin d  9.  Had  flu shot  pna 23 had 07/10/10 due for another given today  Disc shingrix for future  Tdap had 12/13/08 due 2020   Pap and breast exam today   Colonoscopy had 06/06/14 tubular and hyperplastic polyps due to f/u in 2020  mammo sch 12/14/17   Hep C negative in the past   Provider: Dr.  Olivia Mackie McLean-[facility]oc[facility]

## 2017-11-28 NOTE — Telephone Encounter (Signed)
Copied from [place]. Topic: Quick Communication - See Telephone Encounter >> Nov 28, 2017  1:43 PM Vernona Rieger wrote: CRM for notification. See Telephone encounter for:   11/28/17.  Pt said she was seen in the office Friday & they told her that she needed to call and sch a lumbar spine complete for this Wednesday. Dr Aundra Dubin is full & I am very unsure of this. Please advise. Tried to call office, I didn't get anyone. Call pt back @ [phone]

## 2017-11-28 NOTE — Telephone Encounter (Signed)
Pt would like to get lab work done on Wednesday need order please and Thank you! Leave msg if pt does not answer.

## 2017-11-28 NOTE — Telephone Encounter (Signed)
Copied from [place] [id number]. Topic: Appointment Scheduling - Prior Auth Required for Appointment >> Nov 28, 2017  1:46 PM Vernona Rieger wrote: Pt is saying she was to come In on 12/19 for cpe fasting labs. No orders in, patient is going out of town Thursday night. Please advise.

## 2017-11-29 NOTE — Telephone Encounter (Signed)
Left voicemail that orders for labs are placed

## 2017-11-30 ENCOUNTER — Other Ambulatory Visit (INDEPENDENT_AMBULATORY_CARE_PROVIDER_SITE_OTHER): Payer: 59

## 2017-11-30 ENCOUNTER — Ambulatory Visit (INDEPENDENT_AMBULATORY_CARE_PROVIDER_SITE_OTHER): Payer: 59

## 2017-11-30 DIAGNOSIS — I10 Essential (primary) hypertension: Secondary | ICD-10-CM

## 2017-11-30 DIAGNOSIS — E039 Hypothyroidism, unspecified: Secondary | ICD-10-CM | POA: Diagnosis not present

## 2017-11-30 DIAGNOSIS — M2550 Pain in unspecified joint: Secondary | ICD-10-CM | POA: Diagnosis not present

## 2017-11-30 DIAGNOSIS — E559 Vitamin D deficiency, unspecified: Secondary | ICD-10-CM | POA: Diagnosis not present

## 2017-11-30 DIAGNOSIS — R350 Frequency of micturition: Secondary | ICD-10-CM

## 2017-11-30 DIAGNOSIS — M5442 Lumbago with sciatica, left side: Secondary | ICD-10-CM

## 2017-11-30 DIAGNOSIS — M5441 Lumbago with sciatica, right side: Secondary | ICD-10-CM

## 2017-11-30 DIAGNOSIS — Z1159 Encounter for screening for other viral diseases: Secondary | ICD-10-CM | POA: Diagnosis not present

## 2017-11-30 DIAGNOSIS — M48061 Spinal stenosis, lumbar region without neurogenic claudication: Secondary | ICD-10-CM | POA: Diagnosis not present

## 2017-11-30 DIAGNOSIS — M47817 Spondylosis without myelopathy or radiculopathy, lumbosacral region: Secondary | ICD-10-CM | POA: Diagnosis not present

## 2017-11-30 DIAGNOSIS — R339 Retention of urine, unspecified: Secondary | ICD-10-CM

## 2017-11-30 DIAGNOSIS — M4186 Other forms of scoliosis, lumbar region: Secondary | ICD-10-CM | POA: Diagnosis not present

## 2017-11-30 LAB — VITAMIN D 25 HYDROXY (VIT D DEFICIENCY, FRACTURES): VITD: 12.17 ng/mL — ABNORMAL LOW (ref 30.00–100.00)

## 2017-11-30 LAB — LIPID PANEL
CHOL/HDL RATIO: 3
CHOLESTEROL: 174 mg/dL (ref 0–200)
HDL: 50.5 mg/dL (ref >39.00)
LDL CALC: 104 mg/dL — AB (ref 0–99)
NonHDL: 123.8
Triglycerides: 97 mg/dL (ref 0.0–149.0)
VLDL: 19.4 mg/dL (ref 0.0–40.0)

## 2017-11-30 LAB — COMPREHENSIVE METABOLIC PANEL
ALT: 24 U/L (ref 0–35)
AST: 17 U/L (ref 0–37)
Albumin: 3.9 g/dL (ref 3.5–5.2)
Alkaline Phosphatase: 79 U/L (ref 39–117)
BUN: 23 mg/dL (ref 6–23)
CHLORIDE: 106 meq/L (ref 96–112)
CO2: 30 meq/L (ref 19–32)
CREATININE: 0.75 mg/dL (ref 0.40–1.20)
Calcium: 9.4 mg/dL (ref 8.4–10.5)
GFR: 83.69 mL/min (ref >60.00)
GLUCOSE: 84 mg/dL (ref 70–99)
POTASSIUM: 4 meq/L (ref 3.5–5.1)
SODIUM: 142 meq/L (ref 135–145)
Total Bilirubin: 1.4 mg/dL — ABNORMAL HIGH (ref 0.2–1.2)
Total Protein: 6.9 g/dL (ref 6.0–8.3)

## 2017-11-30 LAB — CBC WITH DIFFERENTIAL/PLATELET
BASOS PCT: 1.1 % (ref 0.0–3.0)
Basophils Absolute: 0.1 10*3/uL (ref 0.0–0.1)
EOS PCT: 2.1 % (ref 0.0–5.0)
Eosinophils Absolute: 0.1 10*3/uL (ref 0.0–0.7)
HCT: 43.6 % (ref 36.0–46.0)
HEMOGLOBIN: 14.4 g/dL (ref 12.0–15.0)
LYMPHS ABS: 2.3 10*3/uL (ref 0.7–4.0)
Lymphocytes Relative: 34.5 % (ref 12.0–46.0)
MCHC: 33.1 g/dL (ref 30.0–36.0)
MCV: 93.6 fl (ref 78.0–100.0)
MONO ABS: 0.4 10*3/uL (ref 0.1–1.0)
Monocytes Relative: 5.7 % (ref 3.0–12.0)
NEUTROS ABS: 3.8 10*3/uL (ref 1.4–7.7)
Neutrophils Relative %: 56.6 % (ref 43.0–77.0)
PLATELETS: 238 10*3/uL (ref 150.0–400.0)
RBC: 4.66 Mil/uL (ref 3.87–5.11)
RDW: 14.1 % (ref 11.5–15.5)
WBC: 6.7 10*3/uL (ref 4.0–10.5)

## 2017-11-30 LAB — CYTOLOGY - PAP
Diagnosis: NEGATIVE
HPV (WINDOPATH): NOT DETECTED

## 2017-11-30 LAB — C-REACTIVE PROTEIN: CRP: 0.6 mg/dL (ref 0.5–20.0)

## 2017-11-30 LAB — SEDIMENTATION RATE: Sed Rate: 33 mm/hr — ABNORMAL HIGH (ref 0–30)

## 2017-11-30 LAB — TSH: TSH: 1.68 u[IU]/mL (ref 0.35–4.50)

## 2017-11-30 LAB — T4, FREE: FREE T4: 0.88 ng/dL (ref 0.60–1.60)

## 2017-11-30 NOTE — Addendum Note (Signed)
Addended by: Nanci Pina on: 11/30/2017 04:35 PM   Modules accepted: Orders

## 2017-11-30 NOTE — Telephone Encounter (Signed)
Ok. Thank you.

## 2017-12-01 ENCOUNTER — Encounter: Payer: Self-pay | Admitting: Internal Medicine

## 2017-12-01 ENCOUNTER — Other Ambulatory Visit: Payer: Self-pay | Admitting: Internal Medicine

## 2017-12-01 DIAGNOSIS — E559 Vitamin D deficiency, unspecified: Secondary | ICD-10-CM

## 2017-12-01 MED ORDER — CHOLECALCIFEROL 1.25 MG (50000 UT) PO CAPS
50000.0000 [IU] | ORAL_CAPSULE | ORAL | 1 refills | Status: DC
Start: 2017-12-01 — End: 2018-05-29

## 2017-12-02 LAB — RHEUMATOID FACTOR: Rhuematoid fact SerPl-aCnc: <14 IU/mL (ref <14)

## 2017-12-02 LAB — ANTI-NUCLEAR AB-TITER (ANA TITER): ANA Titer 1: 1:80 {titer} — ABNORMAL HIGH

## 2017-12-02 LAB — CYCLIC CITRUL PEPTIDE ANTIBODY, IGG: Cyclic Citrullin Peptide Ab: <16 UNITS

## 2017-12-02 LAB — ANA: ANA: POSITIVE — AB

## 2017-12-02 LAB — HEPATITIS B SURFACE ANTIBODY, QUANTITATIVE: Hepatitis B-Post: 144 m[IU]/mL (ref >=10)

## 2017-12-05 ENCOUNTER — Other Ambulatory Visit: Payer: Self-pay | Admitting: Internal Medicine

## 2017-12-05 DIAGNOSIS — R768 Other specified abnormal immunological findings in serum: Secondary | ICD-10-CM

## 2017-12-05 DIAGNOSIS — M2550 Pain in unspecified joint: Secondary | ICD-10-CM

## 2017-12-07 ENCOUNTER — Encounter: Payer: Self-pay | Admitting: Family

## 2017-12-14 ENCOUNTER — Ambulatory Visit
Admission: RE | Admit: 2017-12-14 | Discharge: 2017-12-14 | Disposition: A | Discharge status: Home / Self Care | Payer: 59 | Source: Ambulatory Visit | Attending: Family | Admitting: Family

## 2017-12-14 DIAGNOSIS — Z1231 Encounter for screening mammogram for malignant neoplasm of breast: Secondary | ICD-10-CM | POA: Diagnosis not present

## 2017-12-16 ENCOUNTER — Encounter: Payer: Self-pay | Admitting: Internal Medicine

## 2017-12-16 ENCOUNTER — Ambulatory Visit (INDEPENDENT_AMBULATORY_CARE_PROVIDER_SITE_OTHER): Payer: 59 | Admitting: Internal Medicine

## 2017-12-16 VITALS — BP 142/96 | HR 70 | Temp 98.6°F | Resp 16 | Wt 244.0 lb

## 2017-12-16 DIAGNOSIS — E559 Vitamin D deficiency, unspecified: Secondary | ICD-10-CM | POA: Diagnosis not present

## 2017-12-16 DIAGNOSIS — E785 Hyperlipidemia, unspecified: Secondary | ICD-10-CM | POA: Diagnosis not present

## 2017-12-16 DIAGNOSIS — M545 Low back pain: Secondary | ICD-10-CM

## 2017-12-16 DIAGNOSIS — I10 Essential (primary) hypertension: Secondary | ICD-10-CM | POA: Diagnosis not present

## 2017-12-16 DIAGNOSIS — R768 Other specified abnormal immunological findings in serum: Secondary | ICD-10-CM

## 2017-12-16 DIAGNOSIS — M5441 Lumbago with sciatica, right side: Secondary | ICD-10-CM

## 2017-12-16 DIAGNOSIS — R350 Frequency of micturition: Secondary | ICD-10-CM

## 2017-12-16 DIAGNOSIS — M5442 Lumbago with sciatica, left side: Secondary | ICD-10-CM

## 2017-12-16 DIAGNOSIS — G8929 Other chronic pain: Secondary | ICD-10-CM

## 2017-12-16 MED ORDER — AMLODIPINE BESYLATE 5 MG PO TABS: 5.0000 mg | ORAL_TABLET | ORAL | 1 refills | Status: DC

## 2017-12-16 NOTE — Progress Notes (Signed)
Chief Complaint  Patient presents with  . Follow-up   Follow up  1. HTN elevated 142/96 today on norvasc 2.5 and losartan-hctz 100-12.5 2. C/o low back pain and PT has helped in the past but back pain came back Xray 11/2017 with degenerative changes/OA, scoliosis, moderate disc space narrowing. Back pain is worse with standing on mobic and robaxin but not had robaxin 3. Reviewed labs vit D low, LDL elevated, ESR 33, ANA + titer 1:80 referral to rheumatology but she has not heard  4. Still c/o increased urinary freq somehow urine was lost at last visit will redo today.   5. Rash to skin improved with steroid cream but still present and now red rash on legs L>R   Review of Systems  Respiratory: Negative for shortness of breath.   Cardiovascular: Negative for chest pain.  Genitourinary: Positive for frequency.  Musculoskeletal: Positive for back pain.  Skin: Positive for rash.   Past Medical History:  Diagnosis Date  . Anxiety   . Asthma   . HTN (hypertension)   . Hypothyroidism   . Low back pain   . Obesity   . Vitamin D deficiency    Past Surgical History:  Procedure Laterality Date  . APPENDECTOMY    . DILATION AND CURETTAGE OF UTERUS    . LIPOMA EXCISION     Family History  Problem Relation Age of Onset  . Alzheimer's disease Mother   . Hypertension Mother   . Cancer Mother 49       colon  . Lupus Daughter   . Heart attack Maternal Grandmother   . Breast cancer Other   . Breast cancer Maternal Aunt 60   Social History   Socioeconomic History  . Marital status: Married    Spouse name: Not on file  . Number of children: 2  . Years of education: Not on file  . Highest education level: Not on file  Social Needs  . Financial resource strain: Not on file  . Food insecurity - worry: Not on file  . Food insecurity - inability: Not on file  . Transportation needs - medical: Not on file  . Transportation needs - non-medical: Not on file  Occupational History  .  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 Smoking status: Never Smoker  . Smokeless tobacco: Never Used  Substance and Sexual Activity  . Alcohol use: No    Alcohol/week: 0.0 oz  . Drug use: No  . Sexual activity: Not on file  Other Topics Concern  . Not on file  Social History Narrative   Lives in [place].   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    From [facility]   As of 11/2017 married 45 years    2 kids       Regular Exercise -  Not at this time   Daily Caffeine Use:  1 cup hot tea daily         Current Meds  Medication Sig  . albuterol (PROVENTIL HFA;VENTOLIN HFA) 108 (90 Base) MCG/ACT inhaler Inhale 2 puffs into the lungs every 6 (six) hours as needed for wheezing or shortness of breath.  . ALPRAZolam (XANAX) 0.25 MG tablet Take 1 tablet (0.25 mg total) by mouth daily as needed for anxiety.  Marland Kitchen amLODipine (NORVASC) 5 MG tablet Take 1 tablet (5 mg total) by mouth every morning.  . Cholecalciferol 50000 units capsule Take 1 capsule (50,000 Units total)  by mouth once a week.  . levothyroxine (SYNTHROID, LEVOTHROID) 175 MCG tablet Take 1 tablet (175 mcg total) by mouth daily before breakfast.  . losartan-hydrochlorothiazide (HYZAAR) 100-12.5 MG tablet Take 1 tablet by mouth daily.  . meloxicam (MOBIC) 15 MG tablet Take 1 tablet (15 mg total) by mouth daily as needed for pain.  . naproxen sodium (ALEVE) 220 MG tablet Take 220 mg by mouth daily as needed.  . triamcinolone cream (KENALOG) 0.1 % Apply 1 application topically 2 (two) times daily. To skin rash not face,groin, underarms  . [DISCONTINUED] amLODipine (NORVASC) 2.5 MG tablet Take 1 tablet (2.5 mg total) by mouth daily.   No Known Allergies Recent Results (from the past 2160 hour(s))  Cytology - PAP     Status: None   Collection Time: 11/25/17 12:00 AM  Result Value Ref Range   Adequacy      Satisfactory for evaluation  endocervical/transformation zone component PRESENT.   Diagnosis       NEGATIVE FOR INTRAEPITHELIAL LESIONS OR MALIGNANCY.   HPV NOT DETECTED     Comment: Normal Reference Range - NOT Detected   Material Submitted CervicoVaginal Pap [ThinPrep Imaged]   Comprehensive metabolic panel     Status: Abnormal   Collection Time: 11/30/17  2:07 PM  Result Value Ref Range   Sodium 142 135 - 145 mEq/L   Potassium 4.0 3.5 - 5.1 mEq/L   Chloride 106 96 - 112 mEq/L   CO2 30 19 - 32 mEq/L   Glucose, Bld 84 70 - 99 mg/dL   BUN 23 6 - 23 mg/dL   Creatinine, Ser 0.75 0.40 - 1.20 mg/dL   Total Bilirubin 1.4 (H) 0.2 - 1.2 mg/dL   Alkaline Phosphatase 79 39 - 117 U/L   AST 17 0 - 37 U/L   ALT 24 0 - 35 U/L   Total Protein 6.9 6.0 - 8.3 g/dL   Albumin 3.9 3.5 - 5.2 g/dL   Calcium 9.4 8.4 - 10.5 mg/dL   GFR 83.69 >60.00 mL/min  CBC with Differential/Platelet     Status: None   Collection Time: 11/30/17  2:07 PM  Result Value Ref Range   WBC 6.7 4.0 - 10.5 K/uL   RBC 4.66 3.87 - 5.11 Mil/uL   Hemoglobin 14.4 12.0 - 15.0 g/dL   HCT 43.6 36.0 - 46.0 %   MCV 93.6 78.0 - 100.0 fl   MCHC 33.1 30.0 - 36.0 g/dL   RDW 14.1 11.5 - 15.5 %   Platelets 238.0 150.0 - 400.0 K/uL   Neutrophils Relative % 56.6 43.0 - 77.0 %   Lymphocytes Relative 34.5 12.0 - 46.0 %   Monocytes Relative 5.7 3.0 - 12.0 %   Eosinophils Relative 2.1 0.0 - 5.0 %   Basophils Relative 1.1 0.0 - 3.0 %   Neutro Abs 3.8 1.4 - 7.7 K/uL   Lymphs Abs 2.3 0.7 - 4.0 K/uL   Monocytes Absolute 0.4 0.1 - 1.0 K/uL   Eosinophils Absolute 0.1 0.0 - 0.7 K/uL   Basophils Absolute 0.1 0.0 - 0.1 K/uL  Lipid panel     Status: Abnormal   Collection Time: 11/30/17  2:07 PM  Result Value Ref Range   Cholesterol 174 0 - 200 mg/dL    Comment: ATP III Classification       Desirable:  < 200 mg/dL               Borderline High:  200 - 239 mg/dL  High:  > = 240 mg/dL   Triglycerides 97.0 0.0 - 149.0 mg/dL    Comment: Normal:  <150 mg/dLBorderline High:  150 - 199 mg/dL   HDL 50.50 >39.00 mg/dL   VLDL 19.4 0.0 - 40.0  mg/dL   LDL Cholesterol 104 (H) 0 - 99 mg/dL   Total CHOL/HDL Ratio 3     Comment:                Men          Women1/2 Average Risk     3.4          3.3Average Risk          5.0          4.42X Average Risk          9.6          7.13X Average Risk          15.0          11.0                       NonHDL 123.80     Comment: NOTE:  Non-HDL goal should be 30 mg/dL higher than patient's LDL goal (i.e. LDL goal of < 70 mg/dL, would have non-HDL goal of < 100 mg/dL)  T4, free     Status: None   Collection Time: 11/30/17  2:07 PM  Result Value Ref Range   Free T4 0.88 0.60 - 1.60 ng/dL    Comment: Specimens from patients who are undergoing biotin therapy and /or ingesting biotin supplements may contain high levels of biotin.  The higher biotin concentration in these specimens interferes with this Free T4 assay.  Specimens that contain high levels  of biotin may cause false high results for this Free T4 assay.  Please interpret results in light of the total clinical presentation of the patient.    TSH     Status: None   Collection Time: 11/30/17  2:07 PM  Result Value Ref Range   TSH 1.68 0.35 - 4.50 uIU/mL  VITAMIN D 25 Hydroxy (Vit-D Deficiency, Fractures)     Status: Abnormal   Collection Time: 11/30/17  2:07 PM  Result Value Ref Range   VITD 12.17 (L) 30.00 - 100.00 ng/mL  Hepatitis B surface antibody     Status: None   Collection Time: 11/30/17  2:07 PM  Result Value Ref Range   Hepatitis B-Post 144 > OR = 10 mIU/mL    Comment: . Patient has immunity to hepatitis B virus. . For additional information, please refer to http://education.[facility].com/faq/FAQ105 (This link is being provided for informational/ educational purposes only).   Antinuclear Antib (ANA)     Status: Abnormal   Collection Time: 11/30/17  2:07 PM  Result Value Ref Range   Anit Nuclear Antibody(ANA) POSITIVE (A) NEGATIVE    Comment: ANA IFA is a first line screen for detecting the presence of up to  approximately 150 autoantibodies in various autoimmune diseases. A positive ANA IFA result is suggestive of autoimmune disease and reflexes to titer and pattern. Further laboratory testing may be considered if clinically indicated. . Visit Physician FAQs for interpretation of all antibodies in the [place], prevalence, and association with diseases at http://education.[facility].com/ OVZ/CHY850 .   Rheumatoid Factor     Status: None   Collection Time: 11/30/17  2:07 PM  Result Value Ref Range   Rhuematoid fact SerPl-aCnc <14 <14 IU/mL  Sedimentation rate  Status: Abnormal   Collection Time: 11/30/17  2:07 PM  Result Value Ref Range   Sed Rate 33 (H) 0 - 30 mm/hr  C-reactive protein     Status: None   Collection Time: 11/30/17  2:07 PM  Result Value Ref Range   CRP 0.6 0.5 - 93.8 mg/dL  Cyclic citrul peptide antibody, IgG     Status: None   Collection Time: 11/30/17  2:07 PM  Result Value Ref Range   Cyclic Citrullin Peptide Ab <16 UNITS    Comment: Reference Range Negative:            <20 Weak Positive:       20-39 Moderate Positive:   40-59 Strong Positive:     >59 .   Anti-nuclear ab-titer (ANA titer)     Status: Abnormal   Collection Time: 11/30/17  2:07 PM  Result Value Ref Range   ANA Pattern 1 HOMOGENEOUS (A)     Comment: Homogeneous pattern is associated with systemic lupus erythematosus (SLE), drug induced lupus and juvenile idiopathic arthritis.    ANA Titer 1 1:80 (H) titer    Comment: A low level ANA titer may be present in pre-clinical autoimmune diseases and normal individuals.                 Reference Range                 <1:40        Negative                 1:40-1:80    Low Antibody Level                 >1:80        Elevated Antibody Level .    Objective  Body mass index is 45.36 kg/m. Wt Readings from Last 3 Encounters:  12/16/17 244 lb (110.7 kg)  11/22/16 237 lb (107.5 kg)  09/20/16 236 lb 6.4 oz (107.2 kg)   Temp Readings  from Last 3 Encounters:  12/16/17 98.6 F (37 C) (Oral)  11/22/16 97.9 F (36.6 C) (Oral)  09/20/16 98.4 F (36.9 C) (Oral)   BP Readings from Last 3 Encounters:  12/16/17 (!) 142/96  11/22/16 (!) 146/96  09/20/16 (!) 146/96   Pulse Readings from Last 3 Encounters:  12/16/17 70  11/22/16 71  09/20/16 81   O2 sat 98% room air   Physical Exam  Constitutional: She is oriented to person, place, and time and well-developed, well-nourished, and in no distress.  HENT:  Head: Normocephalic and atraumatic.  Mouth/Throat: Oropharynx is clear and moist and mucous membranes are normal.  Eyes: Conjunctivae are normal. Pupils are equal, round, and reactive to light.  Cardiovascular: Normal rate, regular rhythm and normal heart sounds.  Pulmonary/Chest: Effort normal and breath sounds normal.  Abdominal: Soft. Bowel sounds are normal. There is no tenderness.  Neurological: She is alert and oriented to person, place, and time. Gait normal. Gait normal.  Skin: Skin is warm and dry.  Healing papular lesions to right arm  Red macules to left lower leg   Psychiatric: Mood, memory, affect and judgment normal.  Nursing note and vitals reviewed.   Assessment   1. HTN uncontrolled. HLD LDL not at goal <100  2. Chronic Low back pain +Xray 11/2017 reviewed with pt degenerative changes/OA/scoliosis  3. Vit D def  4. Elevated ANA titer 1:80 with elevated ESR  5. Rash ? Etiology ? If related to #4  6. HM 7. Urinary freq and retention r/o UTI Plan  1. Increase norvasc to 5 mg qam  Cont other meds  Given info cholesterol disc healthy diet choices and exercise  2. Refer to Dr. Trixie Dredge ortho in [place] consider steroid inj tried PT in past and pain returned  3. Vit D 50K weekly  4. Pending referral rheumatology  5. Steroid cream helping will have rheumatology weigh in on rash and comment if related #4  6.  Had flu  Tdap had 12/13/08 will be due 2020  pna 23 had 11/25/17  Hep B immune   Consider shingrix disc today   mammo neg 12/14/17  Pap neg 11/25/17 neg HPV colonoscpy had 06/07/14 Dr. Allen Norris polpys hyperplastic and tubular  7. UA and culture today. Apologized last speciman lost    Provider: Dr. Olivia Mackie McLean-Scocuzza-Internal Medicine

## 2017-12-16 NOTE — Patient Instructions (Signed)
Please follow up in 6 weeks sooner if needed  We will check urine today  Check on rheumatology referral with Melissa Consider Shingrix vaccine for shingles   Cholesterol Cholesterol is a white, waxy, fat-like substance that is needed by the human body in small amounts. The liver makes all the cholesterol we need. Cholesterol is carried from the liver by the blood through the blood vessels. Deposits of cholesterol (plaques) may build up on blood vessel (artery) walls. Plaques make the arteries narrower and stiffer. Cholesterol plaques increase the risk for heart attack and stroke. You cannot feel your cholesterol level even if it is very high. The only way to know that it is high is to have a blood test. Once you know your cholesterol levels, you should keep a record of the test results. Work with your health care provider to keep your levels in the desired range. What do the results mean?  Total cholesterol is a rough measure of all the cholesterol in your blood.  LDL (low-density lipoprotein) is the "bad" cholesterol. This is the type that causes plaque to build up on the artery walls. You want this level to be low.  HDL (high-density lipoprotein) is the "good" cholesterol because it cleans the arteries and carries the LDL away. You want this level to be high.  Triglycerides are fat that the body can either burn for energy or store. High levels are closely linked to heart disease. What are the desired levels of cholesterol?  Total cholesterol below 200.  LDL below 100 for people who are at risk, below 70 for people at very high risk.  HDL above 40 is good. A level of 60 or higher is considered to be protective against heart disease.  Triglycerides below 150. How can I lower my cholesterol? Diet Follow your diet program as told by your health care provider.  Choose fish or white meat chicken and [place], roasted or baked. Limit fatty cuts of red meat, fried foods, and processed meats,  such as sausage and lunch meats.  Eat lots of fresh fruits and vegetables.  Choose whole grains, beans, pasta, potatoes, and cereals.  Choose olive oil, corn oil, or canola oil, and use only small amounts.  Avoid butter, mayonnaise, shortening, or palm kernel oils.  Avoid foods with trans fats.  Drink skim or nonfat milk and eat low-fat or nonfat yogurt and cheeses. Avoid whole milk, cream, ice cream, egg yolks, and full-fat cheeses.  Healthier desserts include angel food cake, ginger snaps, animal crackers, hard candy, popsicles, and low-fat or nonfat frozen yogurt. Avoid pastries, cakes, pies, and cookies.  Exercise  Follow your exercise program as told by your health care provider. A regular program: ? Helps to decrease LDL and raise HDL. ? Helps with weight control.  Do things that increase your activity level, such as gardening, walking, and taking the stairs.  Ask your health care provider about ways that you can be more active in your daily life.  Medicine  Take over-the-counter and prescription medicines only as told by your health care provider. ? Medicine may be prescribed by your health care provider to help lower cholesterol and decrease the risk for heart disease. This is usually done if diet and exercise have failed to bring down cholesterol levels. ? If you have several risk factors, you may need medicine even if your levels are normal.  This information is not intended to replace advice given to you by your health care provider. Make sure you  discuss any questions you have with your health care provider. Document Released: 08/24/2001 Document Revised: 06/26/2016 Document Reviewed: 05/29/2016 Elsevier Interactive Patient Education  2018 [place].  Back Pain, Adult Back pain is very common. The pain often gets better over time. The cause of back pain is usually not dangerous. Most people can learn to manage their back pain on their own. Follow these  instructions at home: Watch your back pain for any changes. The following actions may help to lessen any pain you are feeling:  Stay active. Start with short walks on flat ground if you can. Try to walk farther each day.  Exercise regularly as told by your doctor. Exercise helps your back heal faster. It also helps avoid future injury by keeping your muscles strong and flexible.  Do not sit, drive, or stand in one place for more than 30 minutes.  Do not stay in bed. Resting more than 1-2 days can slow down your recovery.  Be careful when you bend or lift an object. Use good form when lifting: ? Bend at your knees. ? Keep the object close to your body. ? Do not twist.  Sleep on a firm mattress. Lie on your side, and bend your knees. If you lie on your back, put a pillow under your knees.  Take medicines only as told by your doctor.  Put ice on the injured area. ? Put ice in a plastic bag. ? Place a towel between your skin and the bag. ? Leave the ice on for 20 minutes, 2-3 times a day for the first 2-3 days. After that, you can switch between ice and heat packs.  Avoid feeling anxious or stressed. Find good ways to deal with stress, such as exercise.  Maintain a healthy weight. Extra weight puts stress on your back.  Contact a doctor if:  You have pain that does not go away with rest or medicine.  You have worsening pain that goes down into your legs or buttocks.  You have pain that does not get better in one week.  You have pain at night.  You lose weight.  You have a fever or chills. Get help right away if:  You cannot control when you poop (bowel movement) or pee (urinate).  Your arms or legs feel weak.  Your arms or legs lose feeling (numbness).  You feel sick to your stomach (nauseous) or throw up (vomit).  You have belly (abdominal) pain.  You feel like you may pass out (faint). This information is not intended to replace advice given to you by your health  care provider. Make sure you discuss any questions you have with your health care provider. Document Released: 05/17/2008 Document Revised: 05/06/2016 Document Reviewed: 04/02/2014 Elsevier Interactive Patient Education  [employer].

## 2017-12-17 LAB — URINALYSIS, ROUTINE W REFLEX MICROSCOPIC
Bilirubin Urine: NEGATIVE
Glucose, UA: NEGATIVE
Hgb urine dipstick: NEGATIVE
Ketones, ur: NEGATIVE
LEUKOCYTES UA: NEGATIVE
NITRITE: NEGATIVE
PH: 5.5 (ref 5.0–8.0)
Protein, ur: NEGATIVE
SPECIFIC GRAVITY, URINE: 1.028 (ref 1.001–1.03)

## 2017-12-17 LAB — URINE CULTURE
MICRO NUMBER:: 90015253
SPECIMEN QUALITY:: ADEQUATE

## 2017-12-19 ENCOUNTER — Telehealth: Payer: Self-pay | Admitting: Internal Medicine

## 2017-12-19 ENCOUNTER — Telehealth: Payer: Self-pay | Admitting: Family

## 2017-12-19 MED ORDER — ZOSTER VAC RECOMB ADJUVANTED 50 MCG/0.5ML IM SUSR: 0.5000 mL | Freq: Once | INTRAMUSCULAR | 1 refills | Status: AC

## 2017-12-19 NOTE — Telephone Encounter (Signed)
Patient is returning call about her lab results- not located in [facility] basket. Patient notified of results.

## 2017-12-19 NOTE — Telephone Encounter (Signed)
Copied from [place] [id number]. Topic: Quick Communication - See Telephone Encounter >> Dec 19, 2017  3:37 PM Vernona Rieger wrote: CRM for notification. See Telephone encounter for:   12/19/17.  Pt wants Dr Aundra Dubin to know that someone at her work [employer]) will be giving her & her husband the shingles shot. Christean Grief) (will put a CRM in on his) They just need a script for it.  Can you fax to [employer] [facility] pharmacy

## 2017-12-19 NOTE — Telephone Encounter (Signed)
Ok to send script ??

## 2018-01-27 ENCOUNTER — Encounter: Payer: Self-pay | Admitting: Internal Medicine

## 2018-01-27 ENCOUNTER — Ambulatory Visit: Payer: 59 | Admitting: Internal Medicine

## 2018-01-27 VITALS — BP 132/90 | HR 56 | Temp 98.0°F | Ht 61.0 in | Wt 237.6 lb

## 2018-01-27 DIAGNOSIS — R768 Other specified abnormal immunological findings in serum: Secondary | ICD-10-CM

## 2018-01-27 DIAGNOSIS — I10 Essential (primary) hypertension: Secondary | ICD-10-CM

## 2018-01-27 DIAGNOSIS — M5442 Lumbago with sciatica, left side: Secondary | ICD-10-CM | POA: Diagnosis not present

## 2018-01-27 DIAGNOSIS — K760 Fatty (change of) liver, not elsewhere classified: Secondary | ICD-10-CM

## 2018-01-27 DIAGNOSIS — K8020 Calculus of gallbladder without cholecystitis without obstruction: Secondary | ICD-10-CM

## 2018-01-27 DIAGNOSIS — M2550 Pain in unspecified joint: Secondary | ICD-10-CM

## 2018-01-27 DIAGNOSIS — M5441 Lumbago with sciatica, right side: Secondary | ICD-10-CM | POA: Diagnosis not present

## 2018-01-27 HISTORY — DX: Fatty (change of) liver, not elsewhere classified: K76.0

## 2018-01-27 NOTE — Patient Instructions (Signed)
Try Mucinex DM or Robitussin DM for cough avoid phenylephrine or sudafed/pseudoephrine  Consider hepatitis A vaccine   Take care  F/u in 3-4 months    Hepatitis A Vaccine, Inactivated suspension for injection What is this medicine? HEPATITIS A VACCINE (hep uh TAHY tis A VAK seen) is a vaccine to protect from an infection with the hepatitis A virus. This vaccine does not contain the live virus. It will not cause a hepatitis infection. This vaccine is also used with immunoglobulin to prevent infection in people who have been exposed to hepatitis A. This medicine may be used for other purposes; ask your health care provider or pharmacist if you have questions. COMMON BRAND NAME(S): Havrix, Vaqta What should I tell my health care provider before I take this medicine? They need to know if you have any of these conditions: -bleeding disorder -fever or infection -heart disease -immune system problems -an unusual or allergic reaction to hepatitis A vaccine, latex, neomycin, other medicines, foods, dyes, or preservatives -pregnant or trying to get pregnant -breast-feeding How should I use this medicine? This vaccine is for injection into a muscle. It is given by a health care professional. A copy of Vaccine Information Statements will be given before each vaccination. Read this sheet carefully each time. The sheet may change frequently. Talk to your pediatrician regarding the use of this medicine in children. While this drug may be prescribed for children as young as 7 months of age for selected conditions, precautions do apply. Overdosage: If you think you have taken too much of this medicine contact a poison control center or emergency room at once. NOTE: This medicine is only for you. Do not share this medicine with others. What if I miss a dose? This does not apply. What may interact with this medicine? -medicines to treat cancer -medicines that suppress your immune function like  adalimumab, anakinra, etanercept, infliximab -steroid medicines like prednisone or cortisone This list may not describe all possible interactions. Give your health care provider a list of all the medicines, herbs, non-prescription drugs, or dietary supplements you use. Also tell them if you smoke, drink alcohol, or use illegal drugs. Some items may interact with your medicine. What should I watch for while using this medicine? See your health care provider for a booster shot of this vaccine as directed. Tell your doctor right away if you have any serious or unusual side effects after getting this vaccine. You will not have protection from the hepatitis A virus for at least 8 to 10 days after your first injection. The length of time you will have protection from hepatitis A virus infection is not known. Check with your doctor if you have questions about your immunity. See your doctor before you travel out of the country. What side effects may I notice from receiving this medicine? Side effects that you should report to your doctor or health care professional as soon as possible: -allergic reactions like skin rash, itching or hives, swelling of the face, lips, or tongue -breathing problems -seizures -yellowing of the eyes or skin Side effects that usually do not require medical attention (report to your doctor or health care professional if they continue or are bothersome): -diarrhea -fever -loss of appetite -muscle pain -nausea -pain, redness, swelling or irritation at site where injected -tiredness This list may not describe all possible side effects. Call your doctor for medical advice about side effects. You may report side effects to FDA at 1-800-FDA-1088. Where should I keep my  medicine? This drug is given in a hospital or clinic and will not be stored at home. NOTE: This sheet is a summary. It may not cover all possible information. If you have questions about this medicine, talk to your  doctor, pharmacist, or health care provider.  2018 Elsevier/Gold Standard (2014-04-01 13:19:40)  Hepatitis A Vaccine: What You Need to Know 1. Why get vaccinated? Hepatitis A is a serious liver disease. It is caused by the hepatitis A virus (HAV). HAV is spread from person to person through contact with the feces (stool) of people who are infected, which can easily happen if someone does not wash his or her hands properly. You can also get hepatitis A from food, water, or objects contaminated with HAV. Symptoms of hepatitis A can include:  fever, fatigue, loss of appetite, nausea, vomiting, and/or joint pain  severe stomach pains and diarrhea (mainly in children), or  jaundice (yellow skin or eyes, dark urine, clay-colored bowel movements).  These symptoms usually appear 2 to 6 weeks after exposure and usually last less than 2 months, although some people can be ill for as long as 6 months. If you have hepatitis A you may be too ill to work. Children often do not have symptoms, but most adults do. You can spread HAV without having symptoms. Hepatitis A can cause liver failure and death, although this is rare and occurs more commonly in persons 14 years of age or older and persons with other liver diseases, such as hepatitis B or C. Hepatitis A vaccine can prevent hepatitis A. Hepatitis A vaccines were recommended in the [place] beginning in 1996. Since then, the number of cases reported each year in the [place] has dropped from around 31,000 cases to fewer than 1,500 cases. 2. Hepatitis A vaccine Hepatitis A vaccine is an inactivated (killed) vaccine. You will need 2 doses for long-lasting protection. These doses should be given at least 6 months apart. Children are routinely vaccinated between their first and second birthdays (96 through 43 months of age). Older children and adolescents can get the vaccine after 23 months. Adults who have not been vaccinated previously and want to be  protected against hepatitis A can also get the vaccine. You should get hepatitis A vaccine if you:  are traveling to countries where hepatitis A is common,  are a man who has sex with other men,  use illegal drugs,  have a chronic liver disease such as hepatitis B or hepatitis C,  are being treated with clotting-factor concentrates,  work with hepatitis A-infected animals or in a hepatitis A research laboratory, or  expect to have close personal contact with an international adoptee from a country where hepatitis A is common  Ask your healthcare provider if you want more information about any of these groups. There are no known risks to getting hepatitis A vaccine at the same time as other vaccines. 3. Some people should not get this vaccine Tell the person who is giving you the vaccine:  If you have any severe, life-threatening allergies. If you ever had a life-threatening allergic reaction after a dose of hepatitis A vaccine, or have a severe allergy to any part of this vaccine, you may be advised not to get vaccinated. Ask your health care provider if you want information about vaccine components.  If you are not feeling well. If you have a mild illness, such as a cold, you can probably get the vaccine today. If you are moderately or severely ill,  you should probably wait until you recover. Your doctor can advise you.  4. Risks of a vaccine reaction With any medicine, including vaccines, there is a chance of side effects. These are usually mild and go away on their own, but serious reactions are also possible. Most people who get hepatitis A vaccine do not have any problems with it. Minor problems following hepatitis A vaccine include:  soreness or redness where the shot was given  low-grade fever  headache  tiredness  If these problems occur, they usually begin soon after the shot and last 1 or 2 days. Your doctor can tell you more about these reactions. Other problems  that could happen after this vaccine:  People sometimes faint after a medical procedure, including vaccination. Sitting or lying down for about 15 minutes can help prevent fainting, and injuries caused by a fall. Tell your provider if you feel dizzy, or have vision changes or ringing in the ears.  Some people get shoulder pain that can be more severe and longer lasting than the more routine soreness that can follow injections. This happens very rarely.  Any medication can cause a severe allergic reaction. Such reactions from a vaccine are very rare, estimated at about 1 in a million doses, and would happen within a few minutes to a few hours after the vaccination. As with any medicine, there is a very remote chance of a vaccine causing a serious injury or death. The safety of vaccines is always being monitored. For more information, visit: http://www.aguilar.org/ 5. What if there is a serious problem? What should I look for? Look for anything that concerns you, such as signs of a severe allergic reaction, very high fever, or unusual behavior. Signs of a severe allergic reaction can include hives, swelling of the face and throat, difficulty breathing, a fast heartbeat, dizziness, and weakness. These would start a few minutes to a few hours after the vaccination. What should I do?  If you think it is a severe allergic reaction or other emergency that can't wait, call 9-1-1 or get to the nearest hospital. Otherwise, call your clinic.  Afterward, the reaction should be reported to the Vaccine Adverse Event Reporting System (VAERS). Your doctor should file this report, or you can do it yourself through the VAERS web site at www.vaers.SamedayNews.es, or by calling 3187184605. ? VAERS does not give medical advice. 6. The National Vaccine Injury Compensation Program The [employer] Vaccine Injury Compensation Program (VICP) is a federal program that was created to compensate people who may have been injured  by certain vaccines. Persons who believe they may have been injured by a vaccine can learn about the program and about filing a claim by calling 956-317-8657 or visiting the [place] website at GoldCloset.com.ee. There is a time limit to file a claim for compensation. 7. How can I learn more?  Ask your healthcare provider. He or she can give you the vaccine package insert or suggest other sources of information.  Call your local or state health department.  Contact the Centers for Disease Control and Prevention (CDC): ? Call 8436800642 (1-800-CDC-INFO) or ? Visit CDC's website at http://hunter.com/ CDC Hepatitis A Vaccine VIS (07/02/2015) This information is not intended to replace advice given to you by your health care provider. Make sure you discuss any questions you have with your health care provider. Document Released: 09/23/2006 Document Revised: 08/19/2016 Document Reviewed: 08/19/2016 Elsevier Interactive Patient Education  2017 Else[employer] Inc.  Fatty Liver Fatty liver, also called hepatic steatosis  or steatohepatitis, is a condition in which too much fat has built up in your liver cells. The liver removes harmful substances from your bloodstream. It produces fluids your body needs. It also helps your body use and store energy from the food you eat. In many cases, fatty liver does not cause symptoms or problems. It is often diagnosed when tests are being done for other reasons. However, over time, fatty liver can cause inflammation that may lead to more serious liver problems, such as scarring of the liver (cirrhosis). What are the causes? Causes of fatty liver may include:  Drinking too much alcohol.  Poor nutrition.  Obesity.  Cushing syndrome.  Diabetes.  Hyperlipidemia.  Pregnancy.  Certain drugs.  Poisons.  Some viral infections.  What increases the risk? You may be more likely to develop fatty liver if you:  Abuse alcohol.  Are  pregnant.  Are overweight.  Have diabetes.  Have hepatitis.  Have a high triglyceride level.  What are the signs or symptoms? Fatty liver often does not cause any symptoms. In cases where symptoms develop, they can include:  Fatigue.  Weakness.  Weight loss.  Confusion.  Abdominal pain.  Yellowing of your skin and the white parts of your eyes (jaundice).  Nausea and vomiting.  How is this diagnosed? Fatty liver may be diagnosed by:  Physical exam and medical history.  Blood tests.  Imaging tests, such as an ultrasound, CT scan, or MRI.  Liver biopsy. A small sample of liver tissue is removed using a needle. The sample is then looked at under a microscope.  How is this treated? Fatty liver is often caused by other health conditions. Treatment for fatty liver may involve medicines and lifestyle changes to manage conditions such as:  Alcoholism.  High cholesterol.  Diabetes.  Being overweight or obese.  Follow these instructions at home:  Eat a healthy diet as directed by your health care provider.  Exercise regularly. This can help you lose weight and control your cholesterol and diabetes. Talk to your health care provider about an exercise plan and which activities are best for you.  Do not drink alcohol.  Take medicines only as directed by your health care provider. Contact a health care provider if: You have difficulty controlling your:  Blood sugar.  Cholesterol.  Alcohol consumption.  Get help right away if:  You have abdominal pain.  You have jaundice.  You have nausea and vomiting. This information is not intended to replace advice given to you by your health care provider. Make sure you discuss any questions you have with your health care provider. Document Released: 01/14/2006 Document Revised: 05/06/2016 Document Reviewed: 04/10/2014 Elsevier Interactive Patient Education  [employer].

## 2018-01-27 NOTE — Progress Notes (Signed)
Chief Complaint  Patient presents with  . Follow-up   F/u with husband in the room  1. HTN controlled on Norvasc 5 mg qd hyzaar 100-12.5 2. Low back pain improved she does not want to see ortho spine specialist for now reviewed low back Xray, Knee pain is better so she stopped mobic, aleve, and robaxin 3 .losing wt via exercise treadmill and healthy diet choices 4. Rash to skin gone  5. +ANA appt with rheumatology 03/10/18 in [place] ANA titer 1:80 prev in 2017 was 1:160.   6. Reviewed labs TB elevated could be 2/2 h/o GS noted use 2014 no sxs currently reviewed [place] also + fatty liver hep B immune disc Hep A vaccine    Review of Systems  Constitutional: Positive for weight loss.  HENT: Negative for hearing loss.   Eyes: Negative for blurred vision.  Respiratory: Negative for shortness of breath.   Cardiovascular: Negative for chest pain.  Gastrointestinal: Negative for abdominal pain.  Musculoskeletal: Negative for back pain and joint pain.  Skin: Negative for rash.  Neurological: Negative for headaches.  Psychiatric/Behavioral: Negative for depression.   Past Medical History:  Diagnosis Date  . Anxiety   . Asthma   . HTN (hypertension)   . Hypothyroidism   . Low back pain   . Obesity   . Vitamin D deficiency    Past Surgical History:  Procedure Laterality Date  . APPENDECTOMY    . DILATION AND CURETTAGE OF UTERUS    . LIPOMA EXCISION     Family History  Problem Relation Age of Onset  . Alzheimer's disease Mother   . Hypertension Mother   . Cancer Mother 67       colon  . Lupus Daughter   . Heart attack Maternal Grandmother   . Breast cancer Other   . Breast cancer Maternal Aunt 10   Social History   Socioeconomic History  . Marital status: Married    Spouse name: Not on file  . Number of children: 2  . Years of education: Not on file  . Highest education level: Not on file  Social Needs  . Financial resource strain: Not on file  . Food insecurity - worry: Not  on file  . Food insecurity - inability: Not on file  . Transportation needs - medical: Not on file  . Transportation needs - non-medical: Not on file  Occupational History  . 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 Smoking status: Never Smoker  . Smokeless tobacco: Never Used  Substance and Sexual Activity  . Alcohol use: No    Alcohol/week: 0.0 oz  . Drug use: No  . Sexual activity: Not on file  Other Topics Concern  . Not on file  Social History Narrative   Lives in [place].   Works at [employer] with GI.    From [facility]   As of 11/2017 married 45 years    2 kids       Regular Exercise -  Not at this time   Daily Caffeine Use:  1 cup hot tea daily         Current Meds  Medication Sig  . albuterol (PROVENTIL HFA;VENTOLIN HFA) 108 (90 Base) MCG/ACT inhaler Inhale 2 puffs into the lungs every 6 (six) hours as needed for wheezing or shortness of breath.  . ALPRAZolam (XANAX) 0.25 MG tablet Take 1 tablet (0.25 mg total) by mouth daily as needed  for anxiety.  Marland Kitchen amLODipine (NORVASC) 5 MG tablet Take 1 tablet (5 mg total) by mouth every morning.  . Cholecalciferol 50000 units capsule Take 1 capsule (50,000 Units total) by mouth once a week.  . levothyroxine (SYNTHROID, LEVOTHROID) 175 MCG tablet Take 1 tablet (175 mcg total) by mouth daily before breakfast.  . losartan-hydrochlorothiazide (HYZAAR) 100-12.5 MG tablet Take 1 tablet by mouth daily.  . [DISCONTINUED] meloxicam (MOBIC) 15 MG tablet Take 1 tablet (15 mg total) by mouth daily as needed for pain.  . [DISCONTINUED] methocarbamol (ROBAXIN) 500 MG tablet Take 1 tablet (500 mg total) by mouth at bedtime as needed for muscle spasms.   No Known Allergies Recent Results (from the past 2160 hour(s))  Cytology - PAP     Status: None   Collection Time: 11/25/17 12:00 AM  Result Value Ref Range   Adequacy      Satisfactory for evaluation  endocervical/transformation zone component  PRESENT.   Diagnosis      NEGATIVE FOR INTRAEPITHELIAL LESIONS OR MALIGNANCY.   HPV NOT DETECTED     Comment: Normal Reference Range - NOT Detected   Material Submitted CervicoVaginal Pap [ThinPrep Imaged]   Comprehensive metabolic panel     Status: Abnormal   Collection Time: 11/30/17  2:07 PM  Result Value Ref Range   Sodium 142 135 - 145 mEq/L   Potassium 4.0 3.5 - 5.1 mEq/L   Chloride 106 96 - 112 mEq/L   CO2 30 19 - 32 mEq/L   Glucose, Bld 84 70 - 99 mg/dL   BUN 23 6 - 23 mg/dL   Creatinine, Ser 0.75 0.40 - 1.20 mg/dL   Total Bilirubin 1.4 (H) 0.2 - 1.2 mg/dL   Alkaline Phosphatase 79 39 - 117 U/L   AST 17 0 - 37 U/L   ALT 24 0 - 35 U/L   Total Protein 6.9 6.0 - 8.3 g/dL   Albumin 3.9 3.5 - 5.2 g/dL   Calcium 9.4 8.4 - 10.5 mg/dL   GFR 83.69 >60.00 mL/min  CBC with Differential/Platelet     Status: None   Collection Time: 11/30/17  2:07 PM  Result Value Ref Range   WBC 6.7 4.0 - 10.5 K/uL   RBC 4.66 3.87 - 5.11 Mil/uL   Hemoglobin 14.4 12.0 - 15.0 g/dL   HCT 43.6 36.0 - 46.0 %   MCV 93.6 78.0 - 100.0 fl   MCHC 33.1 30.0 - 36.0 g/dL   RDW 14.1 11.5 - 15.5 %   Platelets 238.0 150.0 - 400.0 K/uL   Neutrophils Relative % 56.6 43.0 - 77.0 %   Lymphocytes Relative 34.5 12.0 - 46.0 %   Monocytes Relative 5.7 3.0 - 12.0 %   Eosinophils Relative 2.1 0.0 - 5.0 %   Basophils Relative 1.1 0.0 - 3.0 %   Neutro Abs 3.8 1.4 - 7.7 K/uL   Lymphs Abs 2.3 0.7 - 4.0 K/uL   Monocytes Absolute 0.4 0.1 - 1.0 K/uL   Eosinophils Absolute 0.1 0.0 - 0.7 K/uL   Basophils Absolute 0.1 0.0 - 0.1 K/uL  Lipid panel     Status: Abnormal   Collection Time: 11/30/17  2:07 PM  Result Value Ref Range   Cholesterol 174 0 - 200 mg/dL    Comment: ATP III Classification       Desirable:  < 200 mg/dL               Borderline High:  200 - 239 mg/dL  High:  > = 240 mg/dL   Triglycerides 97.0 0.0 - 149.0 mg/dL    Comment: Normal:  <150 mg/dLBorderline High:  150 - 199 mg/dL   HDL 50.50 >39.00  mg/dL   VLDL 19.4 0.0 - 40.0 mg/dL   LDL Cholesterol 104 (H) 0 - 99 mg/dL   Total CHOL/HDL Ratio 3     Comment:                Men          Women1/2 Average Risk     3.4          3.3Average Risk          5.0          4.42X Average Risk          9.6          7.13X Average Risk          15.0          11.0                       NonHDL 123.80     Comment: NOTE:  Non-HDL goal should be 30 mg/dL higher than patient's LDL goal (i.e. LDL goal of < 70 mg/dL, would have non-HDL goal of < 100 mg/dL)  T4, free     Status: None   Collection Time: 11/30/17  2:07 PM  Result Value Ref Range   Free T4 0.88 0.60 - 1.60 ng/dL    Comment: Specimens from patients who are undergoing biotin therapy and /or ingesting biotin supplements may contain high levels of biotin.  The higher biotin concentration in these specimens interferes with this Free T4 assay.  Specimens that contain high levels  of biotin may cause false high results for this Free T4 assay.  Please interpret results in light of the total clinical presentation of the patient.    TSH     Status: None   Collection Time: 11/30/17  2:07 PM  Result Value Ref Range   TSH 1.68 0.35 - 4.50 uIU/mL  VITAMIN D 25 Hydroxy (Vit-D Deficiency, Fractures)     Status: Abnormal   Collection Time: 11/30/17  2:07 PM  Result Value Ref Range   VITD 12.17 (L) 30.00 - 100.00 ng/mL  Hepatitis B surface antibody     Status: None   Collection Time: 11/30/17  2:07 PM  Result Value Ref Range   Hepatitis B-Post 144 > OR = 10 mIU/mL    Comment: . Patient has immunity to hepatitis B virus. . For additional information, please refer to http://education.[facility].com/faq/FAQ105 (This link is being provided for informational/ educational purposes only).   Antinuclear Antib (ANA)     Status: Abnormal   Collection Time: 11/30/17  2:07 PM  Result Value Ref Range   Anit Nuclear Antibody(ANA) POSITIVE (A) NEGATIVE    Comment: ANA IFA is a first line screen for detecting  the presence of up to approximately 150 autoantibodies in various autoimmune diseases. A positive ANA IFA result is suggestive of autoimmune disease and reflexes to titer and pattern. Further laboratory testing may be considered if clinically indicated. . Visit Physician FAQs for interpretation of all antibodies in the [place], prevalence, and association with diseases at http://education.[facility].com/ TMH/DQQ229 .   Rheumatoid Factor     Status: None   Collection Time: 11/30/17  2:07 PM  Result Value Ref Range   Rhuematoid fact SerPl-aCnc <14 <14 IU/mL  Sedimentation rate  Status: Abnormal   Collection Time: 11/30/17  2:07 PM  Result Value Ref Range   Sed Rate 33 (H) 0 - 30 mm/hr  C-reactive protein     Status: None   Collection Time: 11/30/17  2:07 PM  Result Value Ref Range   CRP 0.6 0.5 - 25.4 mg/dL  Cyclic citrul peptide antibody, IgG     Status: None   Collection Time: 11/30/17  2:07 PM  Result Value Ref Range   Cyclic Citrullin Peptide Ab <16 UNITS    Comment: Reference Range Negative:            <20 Weak Positive:       20-39 Moderate Positive:   40-59 Strong Positive:     >59 .   Anti-nuclear ab-titer (ANA titer)     Status: Abnormal   Collection Time: 11/30/17  2:07 PM  Result Value Ref Range   ANA Pattern 1 HOMOGENEOUS (A)     Comment: Homogeneous pattern is associated with systemic lupus erythematosus (SLE), drug induced lupus and juvenile idiopathic arthritis.    ANA Titer 1 1:80 (H) titer    Comment: A low level ANA titer may be present in pre-clinical autoimmune diseases and normal individuals.                 Reference Range                 <1:40        Negative                 1:40-1:80    Low Antibody Level                 >1:80        Elevated Antibody Level .   Urine Culture     Status: None   Collection Time: 12/16/17  3:45 PM  Result Value Ref Range   MICRO NUMBER: [id number]    SPECIMEN QUALITY: ADEQUATE    Sample Source URINE     STATUS: FINAL    Result:      Multiple organisms present, each less than 10,000 CFU/mL. These organisms, commonly found on external and internal genitalia, are considered to be colonizers. No further testing performed.  Urinalysis, Routine w reflex microscopic     Status: Abnormal   Collection Time: 12/16/17  3:45 PM  Result Value Ref Range   Color, Urine YELLOW YELLOW   APPearance CLOUDY (A) CLEAR   Specific Gravity, Urine 1.028 1.001 - 1.03   pH 5.5 5.0 - 8.0   Glucose, UA NEGATIVE NEGATIVE   Bilirubin Urine NEGATIVE NEGATIVE   Ketones, ur NEGATIVE NEGATIVE   Hgb urine dipstick NEGATIVE NEGATIVE   Protein, ur NEGATIVE NEGATIVE   Nitrite NEGATIVE NEGATIVE   Leukocytes, UA NEGATIVE NEGATIVE   Objective  Body mass index is 44.89 kg/m. Wt Readings from Last 3 Encounters:  01/27/18 237 lb 9.6 oz (107.8 kg)  12/16/17 244 lb (110.7 kg)  11/22/16 237 lb (107.5 kg)   Temp Readings from Last 3 Encounters:  01/27/18 98 F (36.7 C) (Oral)  12/16/17 98.6 F (37 C) (Oral)  11/22/16 97.9 F (36.6 C) (Oral)   BP Readings from Last 3 Encounters:  01/27/18 132/90  12/16/17 (!) 142/96  11/22/16 (!) 146/96   Pulse Readings from Last 3 Encounters:  01/27/18 (!) 56  12/16/17 70  11/22/16 71   O2 sat room air 98%  Physical Exam  Constitutional: She is oriented to person, place,  and time and well-developed, well-nourished, and in no distress. Vital signs are normal.  HENT:  Head: Normocephalic and atraumatic.  Mouth/Throat: Oropharynx is clear and moist and mucous membranes are normal.  Eyes: Pupils are equal, round, and reactive to light.  Cardiovascular: Normal rate, regular rhythm and normal heart sounds.  Pulmonary/Chest: Effort normal and breath sounds normal.  Neurological: She is alert and oriented to person, place, and time. Gait normal. Gait normal.  Skin: Skin is warm, dry and intact.  Psychiatric: Mood, memory, affect and judgment normal.  Nursing note and vitals  reviewed.   Assessment   1. HTN controlled  2. Low back pain and knee pain improved no sxs for now  3 obesity  4. H/o ANA + elevated titer  5. Fatty liver  6. HM Plan  1. Cont norvasc 5 mg qd, hyzaar 100-12.5 mg qd  2. Monitor  3. Cont healthy diet choices and exercise  Congratulated on wt loss  4. Rheum appt 03/10/18 [place]  5. rec hep A vaccine consider given info  Hep B immune  Risk factor reduction  6.  Had flu  Tdap had 12/13/08 will be due 2020  pna 23 had 11/25/17  Hep B immune consider hep A vaccine see above  shingrix vx at [facility] to be picked up and given by RN here   mammo neg 12/14/17  Pap neg 11/25/17 neg HPV colonoscopy had 06/07/14 Dr. Allen Norris polpys hyperplastic and tubular f/u as sch   Provider: Dr. Olivia Mackie McLean-Scocuzza-Internal Medicine

## 2018-01-27 NOTE — Progress Notes (Signed)
Pre visit review using our clinic review tool, if applicable. No additional management support is needed unless otherwise documented below in the visit note. 

## 2018-02-28 ENCOUNTER — Encounter: Payer: Self-pay | Admitting: Emergency Medicine

## 2018-02-28 ENCOUNTER — Other Ambulatory Visit: Payer: Self-pay

## 2018-02-28 ENCOUNTER — Emergency Department
Admission: EM | Admit: 2018-02-28 | Discharge: 2018-02-28 | Disposition: A | Discharge status: Home / Self Care | Payer: 59 | Attending: Emergency Medicine | Admitting: Emergency Medicine

## 2018-02-28 DIAGNOSIS — N390 Urinary tract infection, site not specified: Secondary | ICD-10-CM | POA: Insufficient documentation

## 2018-02-28 DIAGNOSIS — R1084 Generalized abdominal pain: Secondary | ICD-10-CM | POA: Diagnosis present

## 2018-02-28 DIAGNOSIS — R109 Unspecified abdominal pain: Secondary | ICD-10-CM | POA: Diagnosis not present

## 2018-02-28 DIAGNOSIS — J45909 Unspecified asthma, uncomplicated: Secondary | ICD-10-CM | POA: Insufficient documentation

## 2018-02-28 DIAGNOSIS — R110 Nausea: Secondary | ICD-10-CM | POA: Diagnosis not present

## 2018-02-28 DIAGNOSIS — I10 Essential (primary) hypertension: Secondary | ICD-10-CM | POA: Insufficient documentation

## 2018-02-28 DIAGNOSIS — E039 Hypothyroidism, unspecified: Secondary | ICD-10-CM | POA: Diagnosis not present

## 2018-02-28 DIAGNOSIS — Z79899 Other long term (current) drug therapy: Secondary | ICD-10-CM | POA: Insufficient documentation

## 2018-02-28 LAB — URINALYSIS, COMPLETE (UACMP) WITH MICROSCOPIC
BACTERIA UA: NONE SEEN
BILIRUBIN URINE: NEGATIVE
Glucose, UA: NEGATIVE mg/dL
KETONES UR: NEGATIVE mg/dL
Nitrite: NEGATIVE
PROTEIN: NEGATIVE mg/dL
Specific Gravity, Urine: 1.019 (ref 1.005–1.030)
pH: 5 (ref 5.0–8.0)

## 2018-02-28 LAB — CBC
HCT: 42.5 % (ref 35.0–47.0)
Hemoglobin: 14.1 g/dL (ref 12.0–16.0)
MCH: 30.5 pg (ref 26.0–34.0)
MCHC: 33.2 g/dL (ref 32.0–36.0)
MCV: 91.8 fL (ref 80.0–100.0)
PLATELETS: 219 10*3/uL (ref 150–440)
RBC: 4.63 MIL/uL (ref 3.80–5.20)
RDW: 14 % (ref 11.5–14.5)
WBC: 8.3 10*3/uL (ref 3.6–11.0)

## 2018-02-28 LAB — BASIC METABOLIC PANEL
Anion gap: 8 (ref 5–15)
BUN: 18 mg/dL (ref 6–20)
CALCIUM: 9.1 mg/dL (ref 8.9–10.3)
CHLORIDE: 105 mmol/L (ref 101–111)
CO2: 25 mmol/L (ref 22–32)
CREATININE: 1.01 mg/dL — AB (ref 0.44–1.00)
GFR calc non Af Amer: 59 mL/min — ABNORMAL LOW (ref >60)
Glucose, Bld: 107 mg/dL — ABNORMAL HIGH (ref 65–99)
Potassium: 4.4 mmol/L (ref 3.5–5.1)
SODIUM: 138 mmol/L (ref 135–145)

## 2018-02-28 MED ORDER — SODIUM CHLORIDE 0.9 % IV BOLUS (SEPSIS)
1000.0000 mL | Freq: Once | INTRAVENOUS | Status: AC
  Administered 2018-02-28: 1000 mL via INTRAVENOUS

## 2018-02-28 MED ORDER — CEPHALEXIN 500 MG PO CAPS: 500.0000 mg | ORAL_CAPSULE | Freq: Three times a day (TID) | ORAL | 0 refills | Status: DC

## 2018-02-28 MED ORDER — ONDANSETRON HCL 4 MG/2ML IJ SOLN
4.0000 mg | Freq: Once | INTRAMUSCULAR | Status: AC
  Administered 2018-02-28: 4 mg via INTRAVENOUS
  Filled 2018-02-28: qty 2

## 2018-02-28 MED ORDER — SODIUM CHLORIDE 0.9 % IV SOLN
1.0000 g | Freq: Once | INTRAVENOUS | Status: AC
  Administered 2018-02-28: 1 g via INTRAVENOUS
  Filled 2018-02-28: qty 10

## 2018-02-28 MED ORDER — ONDANSETRON HCL 4 MG PO TABS: 4.0000 mg | ORAL_TABLET | Freq: Every day | ORAL | 1 refills | Status: DC | PRN

## 2018-02-28 NOTE — ED Notes (Signed)
See triage note --  Pt states pain is intermittent and mainly in left flank, but has started to wrap around to left groin/left lower abdominal area in the last several hours.

## 2018-02-28 NOTE — ED Provider Notes (Signed)
[facility] Emergency Department Provider Note   ____________________________________________   First MD Initiated Contact with Patient 02/28/18 1210     (approximate)  I have reviewed the triage vital signs and the nursing notes.   HISTORY  Chief Complaint Flank Pain   HPI Barbara Blake is a 62 y.o. female is here with complaint of left flank pain that started yesterday.  Patient states she has had nausea but no vomiting.  She denies any previous urinary tract infections or kidney stones.  She states that the pain comes and goes.  She denies fever, chills, hematuria, dysuria or frequency.  She rates her pain as 6 out of 10.   Past Medical History:  Diagnosis Date  . Anxiety   . Asthma   . HTN (hypertension)   . Hypothyroidism   . Low back pain   . Obesity   . Vitamin D deficiency     Patient Active Problem List   Diagnosis Date Noted  . Fatty liver 01/27/2018  . HLD (hyperlipidemia) 12/16/2017  . Midline low back pain with bilateral sciatica 11/28/2017  . Asthma, well controlled 11/28/2017  . Gastroesophageal reflux disease 11/28/2017  . Postnasal drip 11/28/2017  . Hoarseness of voice 11/28/2017  . Asthma, mild intermittent 06/30/2016  . Anxiety state 06/30/2016  . Positive ANA (antinuclear antibody) 02/20/2016  . Arthralgia 02/20/2016  . Vitamin D deficiency 02/20/2016  . Cholelithiasis 04/25/2014  . Severe obesity (BMI >= 40) ([place]) 04/25/2014  . Routine general medical examination at a health care facility 04/04/2014  . Left sided sciatica 04/04/2014  . Screen for colon cancer 04/04/2014  . Hypertension 11/19/2011  . Hypothyroidism 11/19/2011  . Anxiety 11/19/2011    Past Surgical History:  Procedure Laterality Date  . APPENDECTOMY    . DILATION AND CURETTAGE OF UTERUS    . LIPOMA EXCISION      Prior to Admission medications   Medication Sig Start Date End Date Taking? Authorizing Provider  albuterol (PROVENTIL  HFA;VENTOLIN HFA) 108 (90 Base) MCG/ACT inhaler Inhale 2 puffs into the lungs every 6 (six) hours as needed for wheezing or shortness of breath. 06/30/16   Jackolyn Confer, MD  ALPRAZolam Duanne Moron) 0.25 MG tablet Take 1 tablet (0.25 mg total) by mouth daily as needed for anxiety. 11/25/17   McLean-Scocuzza, Nino Glow, MD  amLODipine (NORVASC) 5 MG tablet Take 1 tablet (5 mg total) by mouth every morning. 12/16/17   McLean-Scocuzza, Nino Glow, MD  cephALEXin (KEFLEX) 500 MG capsule Take 1 capsule (500 mg total) by mouth 3 (three) times daily. 02/28/18   Johnn Hai, PA-C  Cholecalciferol 50000 units capsule Take 1 capsule (50,000 Units total) by mouth once a week. 12/01/17   McLean-Scocuzza, Nino Glow, MD  levothyroxine (SYNTHROID, LEVOTHROID) 175 MCG tablet Take 1 tablet (175 mcg total) by mouth daily before breakfast. 11/25/17   McLean-Scocuzza, Nino Glow, MD  losartan-hydrochlorothiazide (HYZAAR) 100-12.5 MG tablet Take 1 tablet by mouth daily. 11/25/17   McLean-Scocuzza, Nino Glow, MD  ondansetron (ZOFRAN) 4 MG tablet Take 1 tablet (4 mg total) by mouth daily as needed for nausea or vomiting. 02/28/18 02/28/19  Johnn Hai, PA-C    Allergies Patient has no known allergies.  Family History  Problem Relation Age of Onset  . Alzheimer's disease Mother   . Hypertension Mother   . Cancer Mother 20       colon  . Lupus Daughter   . Heart attack Maternal Grandmother   .  Breast cancer Other   . Breast cancer Maternal Aunt 64    Social History Social History   Tobacco Use  . Smoking status: Never Smoker  . Smokeless tobacco: Never Used  Substance Use Topics  . Alcohol use: No    Alcohol/week: 0.0 oz  . Drug use: No    Review of Systems Constitutional: No fever/chills Eyes: No visual changes. ENT: No sore throat. Cardiovascular: Denies chest pain. Respiratory: Denies shortness of breath. Gastrointestinal: No abdominal pain.  Positive nausea, no vomiting.  No diarrhea.     Genitourinary: Negative for dysuria.  Negative for hematuria. Musculoskeletal: Positive for left flank pain. Skin: Negative for rash. Neurological: Negative for headaches, focal weakness or numbness. ____________________________________________   PHYSICAL EXAM:  VITAL SIGNS: ED Triage Vitals  Enc Vitals Group     BP 02/28/18 0922 (!) 141/88     Pulse Rate 02/28/18 0920 72     Resp 02/28/18 0920 16     Temp 02/28/18 0920 98.3 F (36.8 C)     Temp Source 02/28/18 0920 Oral     SpO2 02/28/18 0920 100 %     Weight 02/28/18 0921 230 lb (104.3 kg)     Height 02/28/18 0921 5\' 1"  (1.549 m)     Head Circumference --      Peak Flow --      Pain Score 02/28/18 0921 6     Pain Loc --      Pain Edu? --      Excl. in [place]? --    Constitutional: Alert and oriented. Well appearing and in no acute distress. Eyes: Conjunctivae are normal.  Head: Atraumatic. Neck: No stridor.   Cardiovascular: Normal rate, regular rhythm. Grossly normal heart sounds.  Good peripheral circulation. Respiratory: Normal respiratory effort.  No retractions. Lungs CTAB. Gastrointestinal: Soft and nontender. No distention.  No CVA tenderness. Musculoskeletal: Moves upper and lower extremities without any difficulty.  Normal gait was noted. Neurologic:  Normal speech and language. No gross focal neurologic deficits are appreciated. No gait instability. Skin:  Skin is warm, dry and intact. No rash noted. Psychiatric: Mood and affect are normal. Speech and behavior are normal.  ____________________________________________   LABS (all labs ordered are listed, but only abnormal results are displayed)  Labs Reviewed  URINALYSIS, COMPLETE (UACMP) WITH MICROSCOPIC - Abnormal; Notable for the following components:      Result Value   Color, Urine YELLOW (*)    APPearance CLOUDY (*)    Hgb urine dipstick MODERATE (*)    Leukocytes, UA LARGE (*)    Squamous Epithelial / LPF 6-30 (*)    All other components within  normal limits  BASIC METABOLIC PANEL - Abnormal; Notable for the following components:   Glucose, Bld 107 (*)    Creatinine, Ser 1.01 (*)    GFR calc non Af Amer 59 (*)    All other components within normal limits  URINE CULTURE  CBC     PROCEDURES  Procedure(s) performed: None  Procedures  Critical Care performed: No  ____________________________________________   INITIAL IMPRESSION / ASSESSMENT AND PLAN / ED COURSE  Patient presents to the emergency department with complaint of left flank pain.  There has been some nausea without vomiting.  Urinalysis confirmed that patient does have an acute urinary tract infection.  Because of her nausea it was decided that patient would get Rocephin IV while in the department along with IV Zofran.  Patient tolerated medication well and was seen up to  go to the restroom without any difficulties.  Patient was then discharged with a prescription for Zofran as needed and Keflex 500 mg 3 times daily for 10 days.  She is encouraged to follow-up with her PCP for recheck of her urine and for any continued problems.  Also a urine culture was ordered since this is her first urinary tract infection. ____________________________________________   FINAL CLINICAL IMPRESSION(S) / ED DIAGNOSES  Final diagnoses:  Acute urinary tract infection  Left flank pain     ED Discharge Orders        Ordered    cephALEXin (KEFLEX) 500 MG capsule  3 times daily     02/28/18 1334    ondansetron (ZOFRAN) 4 MG tablet  Daily PRN     02/28/18 1334       Note:  This document was prepared using Dragon voice recognition software and may include unintentional dictation errors.    Johnn Hai, PA-C 02/28/18 1545    Schuyler Amor, MD 02/28/18 [phone]

## 2018-02-28 NOTE — ED Triage Notes (Signed)
Pt c/o left flank pain that started yesterday. Pt states that she has had nausea without vomiting. Pt denies hx/o kidney stones. Pt states that the pain comes and goes.

## 2018-02-28 NOTE — ED Notes (Signed)
Pt ambulatory upon discharge. Verbalized understanding of discharge instructions, follow-up care and prescriptions. VSS. Skin warm and dry. A&O x4.  

## 2018-02-28 NOTE — Discharge Instructions (Signed)
Follow-up with your primary care provider at the end of taking the antibiotics to make sure that your urinary tract infection cleared up completely.  Begin taking Keflex 500 mg 3 times daily for the next 10 days.  Zofran if needed for nausea and vomiting.  Increase fluids.  Tylenol or ibuprofen as needed for pain.  Return to the emergency department if any severe worsening of your symptoms or inability to take the antibiotic due to vomiting.

## 2018-03-02 LAB — URINE CULTURE: Special Requests: NORMAL

## 2018-03-20 ENCOUNTER — Telehealth: Payer: Self-pay | Admitting: Internal Medicine

## 2018-03-20 NOTE — Telephone Encounter (Signed)
Copied from [place] [id number]. Topic: Bill or Statement - Patient/Guarantor Inquiry >> Mar 13, 2018 12:31 PM Synthia Innocent wrote: Patient states she was not billed for a physical, when the dr performed on, would like dx code changed.Please advise  >> Mar 13, 2018 12:40 PM Larey Days wrote: I don't have a date of service >> Mar 14, 2018  7:19 AM Synthia Innocent wrote: DOS 12/16/17 >> Mar 20, 2018  2:13 PM Tempie Donning wrote: DOS 12-16-17 was documented as a follow up for HTN.Marland Kitchen Please advise patient that we are unable to change the doctors coding because it matches the documentation.  Thank you, Amy   Documentation given to providers CMA. He will ask the provider if the patient has a physical and if so to addend the note.

## 2018-03-28 NOTE — Telephone Encounter (Signed)
Patient's physical was done on 12.14.18. Patient had a follow up on 1.4.19.

## 2018-04-28 ENCOUNTER — Ambulatory Visit: Payer: Self-pay | Admitting: Internal Medicine

## 2018-04-28 ENCOUNTER — Encounter: Payer: Self-pay | Admitting: Internal Medicine

## 2018-05-01 ENCOUNTER — Telehealth: Payer: Self-pay

## 2018-05-01 DIAGNOSIS — R350 Frequency of micturition: Secondary | ICD-10-CM | POA: Diagnosis not present

## 2018-05-01 DIAGNOSIS — M545 Low back pain: Secondary | ICD-10-CM | POA: Diagnosis not present

## 2018-05-01 NOTE — Telephone Encounter (Signed)
Copied from [place] [id number]. Topic: Appointment Scheduling - Same Day Appointment >> May 01, 2018  7:57 AM Scherrie Gerlach wrote: Patient called to schedule an appointment for TODAY with Dr Aundra Dubin.  Pt thinks she has a kidney infection. Pt asked if she could be worked in last this afternoon, as late as possible, end of day. Pt wants to see her dr.  Left voicemail for patient informing her that Dr. Aundra Dubin does not have availability today but does have an opening tomorrow and to give the office a call back.

## 2018-05-04 ENCOUNTER — Encounter: Payer: Self-pay | Admitting: Internal Medicine

## 2018-05-29 ENCOUNTER — Ambulatory Visit
Admission: EM | Admit: 2018-05-29 | Discharge: 2018-05-29 | Disposition: A | Discharge status: Other Acute Inpatient Hospital | Payer: 59 | Attending: Family Medicine | Admitting: Family Medicine

## 2018-05-29 ENCOUNTER — Other Ambulatory Visit: Payer: Self-pay

## 2018-05-29 ENCOUNTER — Ambulatory Visit
Admission: RE | Admit: 2018-05-29 | Discharge: 2018-05-29 | Disposition: A | Discharge status: Home / Self Care | Payer: 59 | Source: Ambulatory Visit | Attending: Emergency Medicine | Admitting: Emergency Medicine

## 2018-05-29 DIAGNOSIS — N132 Hydronephrosis with renal and ureteral calculous obstruction: Secondary | ICD-10-CM | POA: Insufficient documentation

## 2018-05-29 DIAGNOSIS — R109 Unspecified abdominal pain: Secondary | ICD-10-CM

## 2018-05-29 DIAGNOSIS — I700 Atherosclerosis of aorta: Secondary | ICD-10-CM | POA: Diagnosis not present

## 2018-05-29 DIAGNOSIS — N200 Calculus of kidney: Secondary | ICD-10-CM | POA: Insufficient documentation

## 2018-05-29 DIAGNOSIS — K5730 Diverticulosis of large intestine without perforation or abscess without bleeding: Secondary | ICD-10-CM | POA: Diagnosis not present

## 2018-05-29 DIAGNOSIS — R918 Other nonspecific abnormal finding of lung field: Secondary | ICD-10-CM | POA: Insufficient documentation

## 2018-05-29 HISTORY — DX: Calculus of kidney: N20.0

## 2018-05-29 LAB — URINALYSIS, COMPLETE (UACMP) WITH MICROSCOPIC
BILIRUBIN URINE: NEGATIVE
Bacteria, UA: NONE SEEN
Glucose, UA: NEGATIVE mg/dL
Ketones, ur: NEGATIVE mg/dL
LEUKOCYTES UA: NEGATIVE
Nitrite: NEGATIVE
PROTEIN: NEGATIVE mg/dL
Specific Gravity, Urine: 1.02 (ref 1.005–1.030)
pH: 5.5 (ref 5.0–8.0)

## 2018-05-29 MED ORDER — HYDROCODONE-ACETAMINOPHEN 5-325 MG PO TABS: 1.0000 | ORAL_TABLET | Freq: Four times a day (QID) | ORAL | 0 refills | Status: DC | PRN

## 2018-05-29 MED ORDER — KETOROLAC TROMETHAMINE 60 MG/2ML IM SOLN
60.0000 mg | Freq: Once | INTRAMUSCULAR | Status: AC
  Administered 2018-05-29: 60 mg via INTRAMUSCULAR

## 2018-05-29 NOTE — ED Triage Notes (Signed)
Patient complains of recurrent UTI. Patient states that she has been seen by [facility] ED for this and was treated with Keflex. Patient states that saw [facility] and was treated with cipro. Patient states that they lost her urine and were unable to culture it. Patient states that she constantly feels like she has to urinate. States that this has been ongoing since March.

## 2018-05-29 NOTE — ED Provider Notes (Addendum)
[facility]    CSN: [id number] Arrival date & time: 05/29/18  1505     History   Chief Complaint Chief Complaint  Patient presents with  . Urinary Urgency    HPI Barbara Blake is a 62 y.o. female.    HPI 62 year old female who presents with" recurrent UTI".  She was seen in March at [facility] treated with Keflex and she improved.  She had no pain for a couple of weeks couple weeks but her pain  returned [facility] .They lost her [facility].  They treated her with Cipro.  After that she did well  until today around noon time she had the onset of the back pain on the left side radiating into her flank and down into the labia.  She has had no vaginal discharge.  She has had no fever or chills.  She has been nauseated but has not vomited.  Her pain is a 6 out of 10 at the present time.  Is afebrile.  Pressure is elevated 197/105 likely from pain.         Past Medical History:  Diagnosis Date  . Anxiety   . Asthma   . HTN (hypertension)   . Hypothyroidism   . Low back pain   . Obesity   . Vitamin D deficiency     Patient Active Problem List   Diagnosis Date Noted  . Fatty liver 01/27/2018  . HLD (hyperlipidemia) 12/16/2017  . Midline low back pain with bilateral sciatica 11/28/2017  . Asthma, well controlled 11/28/2017  . Gastroesophageal reflux disease 11/28/2017  . Postnasal drip 11/28/2017  . Hoarseness of voice 11/28/2017  . Asthma, mild intermittent 06/30/2016  . Anxiety state 06/30/2016  . Positive ANA (antinuclear antibody) 02/20/2016  . Arthralgia 02/20/2016  . Vitamin D deficiency 02/20/2016  . Cholelithiasis 04/25/2014  . Severe obesity (BMI >= 40) ([place]) 04/25/2014  . Routine general medical examination at a health care facility 04/04/2014  . Left sided sciatica 04/04/2014  . Screen for colon cancer 04/04/2014  . Hypertension 11/19/2011  . Hypothyroidism 11/19/2011  . Anxiety 11/19/2011    Past Surgical History:  Procedure  Laterality Date  . APPENDECTOMY    . DILATION AND CURETTAGE OF UTERUS    . LIPOMA EXCISION      OB History   None      Home Medications    Prior to Admission medications   Medication Sig Start Date End Date Taking? Authorizing Provider  albuterol (PROVENTIL HFA;VENTOLIN HFA) 108 (90 Base) MCG/ACT inhaler Inhale 2 puffs into the lungs every 6 (six) hours as needed for wheezing or shortness of breath. 06/30/16  Yes Jackolyn Confer, MD  ALPRAZolam Duanne Moron) 0.25 MG tablet Take 1 tablet (0.25 mg total) by mouth daily as needed for anxiety. 11/25/17  Yes McLean-Scocuzza, Nino Glow, MD  amLODipine (NORVASC) 5 MG tablet Take 1 tablet (5 mg total) by mouth every morning. 12/16/17  Yes McLean-Scocuzza, Nino Glow, MD  levothyroxine (SYNTHROID, LEVOTHROID) 175 MCG tablet Take 1 tablet (175 mcg total) by mouth daily before breakfast. 11/25/17  Yes McLean-Scocuzza, Nino Glow, MD  losartan-hydrochlorothiazide (HYZAAR) 100-12.5 MG tablet Take 1 tablet by mouth daily. 11/25/17  Yes McLean-Scocuzza, Nino Glow, MD  HYDROcodone-acetaminophen (NORCO/VICODIN) 5-325 MG tablet Take 1-2 tablets by mouth every 6 (six) hours as needed. 05/29/18   Lorin Picket, PA-C    Family History Family History  Problem Relation Age of Onset  . Alzheimer's disease Mother   .  Hypertension Mother   . Cancer Mother 56       colon  . Lupus Daughter   . Heart attack Maternal Grandmother   . Breast cancer Other   . Breast cancer Maternal Aunt 68    Social History Social History   Tobacco Use  . Smoking status: Never Smoker  . Smokeless tobacco: Never Used  Substance Use Topics  . Alcohol use: No    Alcohol/week: 0.0 oz  . Drug use: No     Allergies   Patient has no known allergies.   Review of Systems Review of Systems  All other systems reviewed and are negative.    Physical Exam Triage Vital Signs ED Triage Vitals  Enc Vitals Group     BP 05/29/18 1532 (!) 197/105     Pulse Rate 05/29/18 1532 81      Resp 05/29/18 1532 19     Temp 05/29/18 1532 98.1 F (36.7 C)     Temp Source 05/29/18 1532 Oral     SpO2 --      Weight 05/29/18 1529 239 lb (108.4 kg)     Height 05/29/18 1529 5\' 1"  (1.549 m)     Head Circumference --      Peak Flow --      Pain Score 05/29/18 1529 8     Pain Loc --      Pain Edu? --      Excl. in [place]? --    No data found.  Updated Vital Signs BP (!) 197/105 (BP Location: Left Arm)   Pulse 81   Temp 98.1 F (36.7 C) (Oral)   Resp 19   Ht 5\' 1"  (1.549 m)   Wt 239 lb (108.4 kg)   BMI 45.16 kg/m   Visual Acuity Right Eye Distance:   Left Eye Distance:   Bilateral Distance:    Right Eye Near:   Left Eye Near:    Bilateral Near:     Physical Exam  Constitutional: She is oriented to person, place, and time. She appears well-developed and well-nourished. No distress.  HENT:  Head: Normocephalic.  Eyes: Pupils are equal, round, and reactive to light. Right eye exhibits no discharge. Left eye exhibits no discharge.  Neck: Normal range of motion.  Pulmonary/Chest: Effort normal and breath sounds normal.  Abdominal:   No CVA tenderness present.  Musculoskeletal: Normal range of motion.  Neurological: She is alert and oriented to person, place, and time.  Skin: Skin is warm and dry. She is not diaphoretic.  Psychiatric: She has a normal mood and affect. Her behavior is normal. Judgment and thought content normal.  Nursing note and vitals reviewed.    UC Treatments / Results  Labs (all labs ordered are listed, but only abnormal results are displayed) Labs Reviewed  URINALYSIS, COMPLETE (UACMP) WITH MICROSCOPIC - Abnormal; Notable for the following components:      Result Value   Hgb urine dipstick TRACE (*)    All other components within normal limits  URINE CULTURE    EKG None  Radiology Ct Abdomen Pelvis Wo Contrast  Result Date: 05/29/2018 CLINICAL DATA:  62 year old female with left flank pain since March. Microhematuria. Prior  appendectomy. Initial encounter. EXAM: CT ABDOMEN AND PELVIS WITHOUT CONTRAST TECHNIQUE: Multidetector CT imaging of the abdomen and pelvis was performed following the standard protocol without IV contrast. COMPARISON:  None. FINDINGS: Lower chest: Numerous tiny pulmonary nodules. If the patient does not have a history of cancer, tiny pulmonary nodules  may be related to prior infection possibly histoplasmosis or tuberculosis or secondary to inflammatory process. Hepatobiliary: Taking into account limitation by non contrast imaging, no worrisome hepatic lesion. Noncalcified gallstone versus gallbladder sludge or gallbladder polyp suspected. Pancreas: Taking into account limitation by non contrast imaging, no pancreatic mass or inflammation. Spleen: Taking into account limitation by non contrast imaging, no splenic mass or enlargement. Adrenals/Urinary Tract: 6 mm distal left ureteral stone (located just proximal to the left ureteral vesical junction) is causing mild to slightly moderate left-sided hydroureteronephrosis. Taking into account limitation by non contrast imaging, no worrisome renal or adrenal lesion. Noncontrast filled partially contracted urinary bladder without gross abnormality. Stomach/Bowel: Diverticulosis most notable sigmoid colon with associated muscular hypertrophy without evidence of diverticulitis. No gastric or small bowel abnormality detected. Vascular/Lymphatic: Trace aortic calcification. No abdominal aortic aneurysm. Scattered normal size lymph nodes without adenopathy. Reproductive: 4 cm rounded hyperdense structure uterine fundus region may represent a fibroid. No worrisome adnexal mass. Other: No free air or bowel containing hernia. Musculoskeletal: Scoliosis lumbar spine convex left with superimposed degenerative changes. IMPRESSION: 6 mm distal left ureteral stone (located just proximal to the left ureteral vesical junction) is causing mild to slightly moderate left-sided  hydroureteronephrosis. Noncalcified gallstone versus gallbladder sludge or gallbladder polyp suspected. Sigmoid diverticulosis. Suspect 4 cm uterine fundus fibroid. Numerous tiny pulmonary nodules. If the patient does not have a history of cancer, tiny pulmonary nodules may be related to prior infection possibly histoplasmosis or tuberculosis or secondary to inflammatory process. No follow-up needed if patient is low-risk (and has no known or suspected primary neoplasm). Non-contrast chest CT can be considered in 12 months if patient is high-risk. This recommendation follows the consensus statement: Guidelines for Management of Incidental Pulmonary Nodules Detected on CT Images: From the Fleischner Society 2017; Radiology 2017; 284:228-243. Aortic Atherosclerosis (ICD10-I70.0). Electronically Signed   By: Genia Del M.D.   On: 05/29/2018 18:04    Procedures Procedures (including critical care time)  Medications Ordered in UC Medications  ketorolac (TORADOL) injection 60 mg (60 mg Intramuscular Given 05/29/18 1628)  Patient had reduction of her pain from 6 out of 10 to 4 out of 10 after the injection  Initial Impression / Assessment and Plan / UC Course  I have reviewed the triage vital signs and the nursing notes.  Pertinent labs & imaging results that were available during my care of the patient were reviewed by me and considered in my medical decision making (see chart for details).     Plan: 1. Test/x-ray results and diagnosis reviewed with patient 2. rx as per orders; risks, benefits, potential side effects reviewed with patient 3. Recommend supportive treatment with plenty of hydration.  Strain urine for stones. 4. F/u prn if symptoms worsen or don't improve  Final Clinical Impressions(s) / UC Diagnoses   Final diagnoses:  Acute left flank pain   Discharge Instructions   None    ED Prescriptions    Medication Sig Dispense Auth. Provider   HYDROcodone-acetaminophen  (NORCO/VICODIN) 5-325 MG tablet  (Status: Discontinued) Take 1-2 tablets by mouth every 6 (six) hours as needed for severe pain. 15 tablet Crecencio Mc P, PA-C   HYDROcodone-acetaminophen (NORCO/VICODIN) 5-325 MG tablet  (Status: Discontinued) Take 1-2 tablets by mouth every 6 (six) hours as needed for severe pain. 15 tablet Crecencio Mc P, PA-C   HYDROcodone-acetaminophen (NORCO/VICODIN) 5-325 MG tablet  (Status: Discontinued) Take 1-2 tablets by mouth every 6 (six) hours as needed for severe pain. 15 tablet Crecencio Mc  P, PA-C   HYDROcodone-acetaminophen (NORCO/VICODIN) 5-325 MG tablet  (Status: Discontinued) Take 1-2 tablets by mouth every 6 (six) hours as needed. 15 tablet Crecencio Mc P, PA-C   HYDROcodone-acetaminophen (NORCO/VICODIN) 5-325 MG tablet Take 1-2 tablets by mouth every 6 (six) hours as needed. 15 tablet Lorin Picket, PA-C     Controlled Substance Prescriptions [place] Controlled Substance Registry consulted?  Patient has not had any opioid prescriptions in the last year from date searched   Juanda Crumble 05/29/18 1652   Addendum: Notified by CT tech of 6 mm distal kidney stone at the junction of the ureterovesical junction. Also noted was tiny pulmonary nodules.  Recommended that she follow-up with her primary care physician to sort this out.  Spoke directly to the patient and told her of the findings.  The meantime she will strain her urine and take the medications for pain as necessary.  She will contact a urologist tomorrow for follow-up will arrange a follow-up with her primary care physician regarding the pulmonary nodules    Lorin Picket, PA-C 05/29/18 2053

## 2018-05-29 NOTE — ED Triage Notes (Signed)
Patient scheduled for CT scan Abd/Pelvis w/o contrast (stone protocol) for now. Patient instructed to head to [facility].

## 2018-05-30 ENCOUNTER — Encounter: Payer: Self-pay | Admitting: Urology

## 2018-05-30 ENCOUNTER — Ambulatory Visit: Payer: 59 | Admitting: Urology

## 2018-05-30 VITALS — BP 134/83 | HR 64 | Ht 61.0 in | Wt 227.9 lb

## 2018-05-30 DIAGNOSIS — N132 Hydronephrosis with renal and ureteral calculous obstruction: Secondary | ICD-10-CM | POA: Diagnosis not present

## 2018-05-30 DIAGNOSIS — R3129 Other microscopic hematuria: Secondary | ICD-10-CM

## 2018-05-30 DIAGNOSIS — N200 Calculus of kidney: Secondary | ICD-10-CM | POA: Diagnosis not present

## 2018-05-30 DIAGNOSIS — R918 Other nonspecific abnormal finding of lung field: Secondary | ICD-10-CM

## 2018-05-30 DIAGNOSIS — N201 Calculus of ureter: Secondary | ICD-10-CM | POA: Diagnosis not present

## 2018-05-30 MED ORDER — TAMSULOSIN HCL 0.4 MG PO CAPS
0.4000 mg | ORAL_CAPSULE | Freq: Every day | ORAL | 3 refills | Status: DC
Start: 2018-05-30 — End: 2018-11-17

## 2018-05-30 NOTE — Progress Notes (Signed)
05/30/2018 4:05 PM   Barbara Blake [birth year] [id number]  Referring provider: McLean-Scocuzza, Nino Glow, MD [place], [place] [id number]  Chief Complaint  Patient presents with  . Nephrolithiasis    HPI: Patient is a 62 year old female who [facility], [facility] for a left distal ureteral stone.  She presented to [facility] urgent care yesterday experiencing pain in the left side radiating to her left flank and down into her left labia.  At that time her pain was 6/10.  She was experiencing nausea.  Her UA was positive for 0-5 WBC's and 6-10 RBC's.  Urine culture is pending.    She does not have a prior history of stone.    CT Renal stone study noted 6 mm distal left ureteral stone (located just proximal to the left ureteral vesical junction) is causing mild to slightly moderate left-sided hydroureteronephrosis.  Noncalcified gallstone versus gallbladder sludge or gallbladder polyp suspected.  Sigmoid diverticulosis.  Suspect 4 cm uterine fundus fibroid.  Numerous tiny pulmonary nodules. If the patient does not have a history of cancer, tiny pulmonary nodules may be related to prior infection possibly histoplasmosis or tuberculosis or secondary to inflammatory process. No follow-up needed if patient is low-risk (and has no known or suspected primary neoplasm). Non-contrast chest CT can be considered in 12 months if patient is high-risk. This recommendation follows the consensus statement: Guidelines for Management of Incidental Pulmonary Nodules Detected on CT Images: From the Fleischner Society 2017; Radiology 2017; 284:228-243.  Aortic Atherosclerosis  At this time, she is experiencing nocturia, intermittency, hesitancy, straining to urinate, blood in the urine and a weak urinary stream.  Patient denies any gross hematuria, dysuria or suprapubic/flank pain.  Patient denies any fevers, chills, nausea or vomiting.     PMH: Past Medical History:    Diagnosis Date  . Anxiety   . Asthma   . HTN (hypertension)   . Hypothyroidism   . Low back pain   . Obesity   . Vitamin D deficiency     Surgical History: Past Surgical History:  Procedure Laterality Date  . APPENDECTOMY    . DILATION AND CURETTAGE OF UTERUS    . LIPOMA EXCISION      Home Medications:  Allergies as of 05/30/2018   No Known Allergies     Medication List        Accurate as of 05/30/18  4:05 PM. Always use your most recent med list.          albuterol 108 (90 Base) MCG/ACT inhaler Commonly known as:  PROVENTIL HFA;VENTOLIN HFA Inhale 2 puffs into the lungs every 6 (six) hours as needed for wheezing or shortness of breath.   ALPRAZolam 0.25 MG tablet Commonly known as:  XANAX Take 1 tablet (0.25 mg total) by mouth daily as needed for anxiety.   amLODipine 5 MG tablet Commonly known as:  NORVASC Take 1 tablet (5 mg total) by mouth every morning.   HYDROcodone-acetaminophen 5-325 MG tablet Commonly known as:  NORCO/VICODIN Take 1-2 tablets by mouth every 6 (six) hours as needed.   levothyroxine 175 MCG tablet Commonly known as:  SYNTHROID, LEVOTHROID Take 1 tablet (175 mcg total) by mouth daily before breakfast.   losartan-hydrochlorothiazide 100-12.5 MG tablet Commonly known as:  HYZAAR Take 1 tablet by mouth daily.   tamsulosin 0.4 MG Caps capsule Commonly known as:  FLOMAX Take 1 capsule (0.4 mg total) by mouth daily.  Allergies: No Known Allergies  Family History: Family History  Problem Relation Age of Onset  . Alzheimer's disease Mother   . Hypertension Mother   . Cancer Mother 30       colon  . Lupus Daughter   . Heart attack Maternal Grandmother   . Breast cancer Other   . Breast cancer Maternal Aunt 21  . Bladder Cancer Neg Hx   . Kidney cancer Neg Hx     Social History:  reports that she has never smoked. She has never used smokeless tobacco. She reports that she does not drink alcohol or use  drugs.  ROS: UROLOGY Frequent Urination?: No Hard to postpone urination?: No Burning/pain with urination?: No Get up at night to urinate?: Yes Leakage of urine?: No Urine stream starts and stops?: Yes Trouble starting stream?: Yes Do you have to strain to urinate?: Yes Blood in urine?: Yes Urinary tract infection?: Yes Sexually transmitted disease?: No Injury to kidneys or bladder?: No Painful intercourse?: No Weak stream?: Yes Currently pregnant?: No Vaginal bleeding?: No Last menstrual period?: n  Gastrointestinal Nausea?: Yes Vomiting?: No Indigestion/heartburn?: No Diarrhea?: No Constipation?: No  Constitutional Fever: No Night sweats?: No Weight loss?: No Fatigue?: No  Skin Skin rash/lesions?: No Itching?: No  Eyes Blurred vision?: No Double vision?: No  Ears/Nose/Throat Sore throat?: No Sinus problems?: No  Hematologic/Lymphatic Swollen glands?: No Easy bruising?: No  Cardiovascular Leg swelling?: No Chest pain?: No  Respiratory Cough?: No Shortness of breath?: No  Endocrine Excessive thirst?: No  Musculoskeletal Back pain?: Yes Joint pain?: No  Neurological Headaches?: No Dizziness?: No  Psychologic Depression?: No Anxiety?: No  Physical Exam: BP 134/83 (BP Location: Right Arm, Patient Position: Sitting, Cuff Size: Normal)   Pulse 64   Ht 5' 1"  (1.549 m)   Wt 227 lb 14.4 oz (103.4 kg)   BMI 43.06 kg/m   Constitutional:  Well nourished. Alert and oriented, No acute distress. HEENT: [place] AT, moist mucus membranes.  Trachea midline, no masses. Cardiovascular: No clubbing, cyanosis, or edema. Respiratory: Normal respiratory effort, no increased work of breathing. Skin: No rashes, bruises or suspicious lesions. Lymph: No cervical or inguinal adenopathy. Neurologic: Grossly intact, no focal deficits, moving all 4 extremities. Psychiatric: Normal mood and affect.  Laboratory Data: Lab Results  Component Value Date   WBC 8.3  02/28/2018   HGB 14.1 02/28/2018   HCT 42.5 02/28/2018   MCV 91.8 02/28/2018   PLT 219 02/28/2018    Lab Results  Component Value Date   CREATININE 1.01 (H) 02/28/2018    No results found for: PSA  No results found for: TESTOSTERONE  Lab Results  Component Value Date   HGBA1C 5.5 11/24/2016    Lab Results  Component Value Date   TSH 1.68 11/30/2017       Component Value Date/Time   CHOL 174 11/30/2017 1407   HDL 50.50 11/30/2017 1407   CHOLHDL 3 11/30/2017 1407   VLDL 19.4 11/30/2017 1407   LDLCALC 104 (H) 11/30/2017 1407    Lab Results  Component Value Date   AST 17 11/30/2017   Lab Results  Component Value Date   ALT 24 11/30/2017   No components found for: ALKALINEPHOPHATASE No components found for: BILIRUBINTOTAL  No results found for: ESTRADIOL  Urinalysis    Component Value Date/Time   COLORURINE YELLOW 05/29/2018 [place] 05/29/2018 1529   LABSPEC 1.020 05/29/2018 1529   PHURINE 5.5 05/29/2018 1529   GLUCOSEU NEGATIVE 05/29/2018 1529  HGBUR TRACE (A) 05/29/2018 1529   BILIRUBINUR NEGATIVE 05/29/2018 1529   KETONESUR NEGATIVE 05/29/2018 1529   PROTEINUR NEGATIVE 05/29/2018 1529   NITRITE NEGATIVE 05/29/2018 1529   LEUKOCYTESUR NEGATIVE 05/29/2018 1529    I have reviewed the labs.   Pertinent Imaging: CLINICAL DATA:  62 year old female with left flank pain since March. Microhematuria. Prior appendectomy. Initial encounter.  EXAM: CT ABDOMEN AND PELVIS WITHOUT CONTRAST  TECHNIQUE: Multidetector CT imaging of the abdomen and pelvis was performed following the standard protocol without IV contrast.  COMPARISON:  None.  FINDINGS: Lower chest: Numerous tiny pulmonary nodules. If the patient does not have a history of cancer, tiny pulmonary nodules may be related to prior infection possibly histoplasmosis or tuberculosis or secondary to inflammatory process.  Hepatobiliary: Taking into account limitation by  non contrast imaging, no worrisome hepatic lesion.  Noncalcified gallstone versus gallbladder sludge or gallbladder polyp suspected.  Pancreas: Taking into account limitation by non contrast imaging, no pancreatic mass or inflammation.  Spleen: Taking into account limitation by non contrast imaging, no splenic mass or enlargement.  Adrenals/Urinary Tract: 6 mm distal left ureteral stone (located just proximal to the left ureteral vesical junction) is causing mild to slightly moderate left-sided hydroureteronephrosis.  Taking into account limitation by non contrast imaging, no worrisome renal or adrenal lesion.  Noncontrast filled partially contracted urinary bladder without gross abnormality.  Stomach/Bowel: Diverticulosis most notable sigmoid colon with associated muscular hypertrophy without evidence of diverticulitis.  No gastric or small bowel abnormality detected.  Vascular/Lymphatic: Trace aortic calcification. No abdominal aortic aneurysm. Scattered normal size lymph nodes without adenopathy.  Reproductive: 4 cm rounded hyperdense structure uterine fundus region may represent a fibroid. No worrisome adnexal mass.  Other: No free air or bowel containing hernia.  Musculoskeletal: Scoliosis lumbar spine convex left with superimposed degenerative changes.  IMPRESSION: 6 mm distal left ureteral stone (located just proximal to the left ureteral vesical junction) is causing mild to slightly moderate left-sided hydroureteronephrosis.  Noncalcified gallstone versus gallbladder sludge or gallbladder polyp suspected.  Sigmoid diverticulosis.  Suspect 4 cm uterine fundus fibroid.  Numerous tiny pulmonary nodules. If the patient does not have a history of cancer, tiny pulmonary nodules may be related to prior infection possibly histoplasmosis or tuberculosis or secondary to inflammatory process. No follow-up needed if patient is low-risk (and has no  known or suspected primary neoplasm). Non-contrast chest CT can be considered in 12 months if patient is high-risk. This recommendation follows the consensus statement: Guidelines for Management of Incidental Pulmonary Nodules Detected on CT Images: From the Fleischner Society 2017; Radiology 2017; 284:228-243.  Aortic Atherosclerosis (ICD10-I70.0).   Electronically Signed   By: Genia Del M.D.   On: 05/29/2018 18:04  I have independently reviewed the films with patient  Assessment & Plan:    1. Left ureteral stone  Explained to the patient that AUA Guidelines for patients with uncomplicated ureteral stones ?10 mm should be offered observation, and those with distal stones of similar size should be offered MET with ?-blockers -tamsulosin 0.4 mg a sent to pharmacy She will return in 1 week for KUB and symptom recheck  She was given a calculus strainer and Vicodin at [facility] urgent care and will bring any fragment that she captures in for analysis   2. Left hydronephrosis Will obtain RUS to ensure the hydronephrosis has resolved once they have passed and/or recovered from procedure to ensure to iatrogenic hydronephrosis remains - it is explained to the patient that it is important  to document resolution of the hydronephrosis as "silent hydronephrosis" can occur and cause damage and/or loss of the kidney  3. Microscopic hematuria We will continue to monitor the patient's UA after the treatment/passage of the stone to ensure the hematuria has resolved If hematuria persists, we will pursue a hematuria workup with CT Urogram and cystoscopy if appropriate. Urine culture is pending Basic metabolic panel  CBC with Differential/Platelet Advised to contact our office or seek treatment in the ED if becomes febrile or pain/ vomiting are difficult control in order to arrange for emergent/urgent intervention  4. Pulmonary nodules Patient has appointment with her PCP, Dr. Terese Door on  Friday - will forward CT results to her  Return in about 1 week (around 06/06/2018) for KUB and exam .  These notes generated with voice recognition software. I apologize for typographical errors.  Zara Council, PA-C  [facility] [address]  [place], [place] [address] [phone]

## 2018-05-31 LAB — CBC WITH DIFFERENTIAL/PLATELET
BASOS ABS: 0.1 10*3/uL (ref 0.0–0.2)
Basos: 1 %
EOS (ABSOLUTE): 0.2 10*3/uL (ref 0.0–0.4)
Eos: 2 %
HEMOGLOBIN: 13.2 g/dL (ref 11.1–15.9)
Hematocrit: 39.9 % (ref 34.0–46.6)
Immature Grans (Abs): 0 10*3/uL (ref 0.0–0.1)
Immature Granulocytes: 0 %
LYMPHS ABS: 2 10*3/uL (ref 0.7–3.1)
Lymphs: 28 %
MCH: 29.9 pg (ref 26.6–33.0)
MCHC: 33.1 g/dL (ref 31.5–35.7)
MCV: 91 fL (ref 79–97)
MONOS ABS: 0.4 10*3/uL (ref 0.1–0.9)
Monocytes: 6 %
NEUTROS ABS: 4.3 10*3/uL (ref 1.4–7.0)
Neutrophils: 63 %
Platelets: 270 10*3/uL (ref 150–450)
RBC: 4.41 x10E6/uL (ref 3.77–5.28)
RDW: 14.2 % (ref 12.3–15.4)
WBC: 6.9 10*3/uL (ref 3.4–10.8)

## 2018-05-31 LAB — BASIC METABOLIC PANEL
BUN / CREAT RATIO: 19 (ref 12–28)
BUN: 17 mg/dL (ref 8–27)
CO2: 25 mmol/L (ref 20–29)
Calcium: 9.5 mg/dL (ref 8.7–10.3)
Chloride: 106 mmol/L (ref 96–106)
Creatinine, Ser: 0.9 mg/dL (ref 0.57–1.00)
GFR calc non Af Amer: 70 mL/min/{1.73_m2} (ref >59)
GFR, EST AFRICAN AMERICAN: 80 mL/min/{1.73_m2} (ref >59)
GLUCOSE: 90 mg/dL (ref 65–99)
POTASSIUM: 3.6 mmol/L (ref 3.5–5.2)
SODIUM: 144 mmol/L (ref 134–144)

## 2018-05-31 LAB — URINE CULTURE

## 2018-06-01 NOTE — Progress Notes (Signed)
06/05/2018 8:33 AM   Barbara Blake 08-05-1956 [id number]  Referring provider: McLean-Scocuzza, Nino Glow, MD [place], [place] [address]  Chief Complaint  Patient presents with  . Nephrolithiasis    HPI: Patient is a 62 year old female who was seen at [facility] urgent care on May 29, 2018 for a left distal ureteral stone.  She presented to [facility] urgent care on 05/29/2018 experiencing pain in the left side radiating to her left flank and down into her left labia.  At that time her pain was 6/10.  She was experiencing nausea.  Her UA was positive for 0-5 WBC's and 6-10 RBC's.  Urine culture is pending.    She does not have a prior history of stone.    CT Renal stone study noted 6 mm distal left ureteral stone (located just proximal to the left ureteral vesical junction) is causing mild to slightly moderate left-sided hydroureteronephrosis.  Noncalcified gallstone versus gallbladder sludge or gallbladder polyp suspected.  Sigmoid diverticulosis.  Suspect 4 cm uterine fundus fibroid.  Numerous tiny pulmonary nodules. If the patient does not have a history of cancer, tiny pulmonary nodules may be related to prior infection possibly histoplasmosis or tuberculosis or secondary to inflammatory process. No follow-up needed if patient is low-risk (and has no known or suspected primary neoplasm). Non-contrast chest CT can be considered in 12 months if patient is high-risk. This recommendation follows the consensus statement: Guidelines for Management of Incidental Pulmonary Nodules Detected on CT Images: From the Fleischner Society 2017; Radiology 2017; 284:228-243.  Aortic Atherosclerosis  She presents today after 1 week of MET therapy with tamsulosin.  Her KUB on 06/05/2018 noted a persistent stone at the left UVJ.  Her UA today is negative.  She is having urinary frequency at this time.  Patient denies any gross hematuria, dysuria or suprapubic/flank pain.  Patient denies any fevers,  chills, nausea or vomiting.     PMH: Past Medical History:  Diagnosis Date  . Anxiety   . Asthma   . HTN (hypertension)   . Hypothyroidism   . Kidney stone    05/29/18  . Low back pain   . Obesity   . Vitamin D deficiency     Surgical History: Past Surgical History:  Procedure Laterality Date  . APPENDECTOMY    . DILATION AND CURETTAGE OF UTERUS    . LIPOMA EXCISION      Home Medications:  Allergies as of 06/05/2018   No Known Allergies     Medication List        Accurate as of 06/05/18  8:33 AM. Always use your most recent med list.          albuterol 108 (90 Base) MCG/ACT inhaler Commonly known as:  PROVENTIL HFA;VENTOLIN HFA Inhale 2 puffs into the lungs every 6 (six) hours as needed for wheezing or shortness of breath.   ALPRAZolam 0.25 MG tablet Commonly known as:  XANAX Take 1 tablet (0.25 mg total) by mouth daily as needed for anxiety.   amLODipine 5 MG tablet Commonly known as:  NORVASC Take 1 tablet (5 mg total) by mouth every morning.   HYDROcodone-acetaminophen 5-325 MG tablet Commonly known as:  NORCO/VICODIN Take 1-2 tablets by mouth every 6 (six) hours as needed.   levothyroxine 175 MCG tablet Commonly known as:  SYNTHROID, LEVOTHROID Take 1 tablet (175 mcg total) by mouth daily before breakfast.   losartan-hydrochlorothiazide 100-12.5 MG tablet Commonly known as:  HYZAAR Take 1 tablet by mouth  daily.   tamsulosin 0.4 MG Caps capsule Commonly known as:  FLOMAX Take 1 capsule (0.4 mg total) by mouth daily.       Allergies: No Known Allergies  Family History: Family History  Problem Relation Age of Onset  . Alzheimer's disease Mother   . Hypertension Mother   . Cancer Mother 26       colon  . Lupus Daughter   . Heart attack Maternal Grandmother   . Breast cancer Other   . Breast cancer Maternal Aunt 30  . Bladder Cancer Neg Hx   . Kidney cancer Neg Hx     Social History:  reports that she has never smoked. She has never  used smokeless tobacco. She reports that she does not drink alcohol or use drugs.  ROS: UROLOGY Frequent Urination?: Yes Hard to postpone urination?: No Burning/pain with urination?: No Get up at night to urinate?: No Leakage of urine?: No Urine stream starts and stops?: No Trouble starting stream?: No Do you have to strain to urinate?: No Blood in urine?: No Urinary tract infection?: No Sexually transmitted disease?: No Injury to kidneys or bladder?: No Painful intercourse?: No Weak stream?: No Currently pregnant?: No Vaginal bleeding?: No Last menstrual period?: Postmenopausal   Gastrointestinal Nausea?: No Vomiting?: No Indigestion/heartburn?: No Diarrhea?: No Constipation?: No  Constitutional Fever: No Night sweats?: No Weight loss?: No Fatigue?: No  Skin Skin rash/lesions?: No Itching?: No  Eyes Blurred vision?: No Double vision?: No  Ears/Nose/Throat Sore throat?: No Sinus problems?: No  Hematologic/Lymphatic Swollen glands?: No Easy bruising?: No  Cardiovascular Leg swelling?: No Chest pain?: No  Respiratory Cough?: No Shortness of breath?: No  Endocrine Excessive thirst?: No  Musculoskeletal Back pain?: No Joint pain?: No  Neurological Headaches?: No Dizziness?: No  Psychologic Depression?: No Anxiety?: No  Physical Exam: BP 125/84 (BP Location: Right Arm, Patient Position: Sitting, Cuff Size: Large)   Pulse 80   Ht 5' 1"  (1.549 m)   Wt 228 lb (103.4 kg)   BMI 43.08 kg/m   Constitutional: Well nourished. Alert and oriented, No acute distress. HEENT: [place] AT, moist mucus membranes. Trachea midline, no masses. Cardiovascular: No clubbing, cyanosis, or edema. Respiratory: Normal respiratory effort, no increased work of breathing. Skin: No rashes, bruises or suspicious lesions. Lymph: No cervical or inguinal adenopathy. Neurologic: Grossly intact, no focal deficits, moving all 4 extremities. Psychiatric: Normal mood and  affect.   Laboratory Data: Lab Results  Component Value Date   WBC 6.9 05/30/2018   HGB 13.2 05/30/2018   HCT 39.9 05/30/2018   MCV 91 05/30/2018   PLT 270 05/30/2018    Lab Results  Component Value Date   CREATININE 0.90 05/30/2018    No results found for: PSA  No results found for: TESTOSTERONE  Lab Results  Component Value Date   HGBA1C 5.5 11/24/2016    Lab Results  Component Value Date   TSH 1.68 11/30/2017       Component Value Date/Time   CHOL 174 11/30/2017 1407   HDL 50.50 11/30/2017 1407   CHOLHDL 3 11/30/2017 1407   VLDL 19.4 11/30/2017 1407   LDLCALC 104 (H) 11/30/2017 1407    Lab Results  Component Value Date   AST 17 11/30/2017   Lab Results  Component Value Date   ALT 24 11/30/2017   No components found for: ALKALINEPHOPHATASE No components found for: BILIRUBINTOTAL  No results found for: ESTRADIOL  Urinalysis    Component Value Date/Time   COLORURINE YELLOW 05/29/2018  [place] 05/29/2018 1529   LABSPEC 1.020 05/29/2018 1529   PHURINE 5.5 05/29/2018 1529   GLUCOSEU NEGATIVE 05/29/2018 1529   HGBUR TRACE (A) 05/29/2018 1529   BILIRUBINUR NEGATIVE 05/29/2018 1529   KETONESUR NEGATIVE 05/29/2018 1529   PROTEINUR NEGATIVE 05/29/2018 1529   NITRITE NEGATIVE 05/29/2018 1529   LEUKOCYTESUR NEGATIVE 05/29/2018 1529    I have reviewed the labs.   Pertinent Imaging: CLINICAL DATA:  Follow-up left UVJ stone.  EXAM: ABDOMEN - 1 VIEW  COMPARISON:  CT scan of the abdomen and pelvis of May 29, 2018  FINDINGS: There is a persistent calcification measuring approximately 6 mm in diameter which projects in the region of the left UVJ similar to that seen on the previous CT scan. No other stones are evident. The bowel gas pattern is normal. There are degenerative changes of the lower lumbar spine with gentle levocurvature.  IMPRESSION: Persistent stone at the left UVJ.   Electronically Signed   By: David   Martinique M.D.   On: 06/05/2018 08:32  I have independently reviewed the films with patient  Assessment & Plan:    1. Left ureteral stone  Patient is asymptomatic at this time Will continue with MET as she is very concerned regarding the cost of further treatments She will return in 1 week for KUB and symptom recheck  She will continue to [place] to contact our office or seek treatment in the ED if becomes febrile or pain/ vomiting are difficult control in order to arrange for emergent/urgent intervention   2. Left hydronephrosis Will obtain RUS to ensure the hydronephrosis has resolved once they have passed and/or recovered from procedure to ensure to iatrogenic hydronephrosis remains - it is explained to the patient that it is important to document resolution of the hydronephrosis as "silent hydronephrosis" can occur and cause damage and/or loss of the kidney  3. Microscopic hematuria No AMH on today's UA  4. Pulmonary nodules Has been seen by Dr. Terese Door and is being worked up for pulmonary nodules   Return in about 1 week (around 06/12/2018) for KUB and office visit .  These notes generated with voice recognition software. I apologize for typographical errors.  Zara Council, PA-C  [facility] [address]  [place], [place] [address] [phone]

## 2018-06-02 ENCOUNTER — Ambulatory Visit: Payer: 59 | Admitting: Internal Medicine

## 2018-06-02 ENCOUNTER — Encounter: Payer: Self-pay | Admitting: Internal Medicine

## 2018-06-02 VITALS — BP 124/84 | HR 76 | Temp 98.4°F | Ht 61.0 in | Wt 227.8 lb

## 2018-06-02 DIAGNOSIS — K8020 Calculus of gallbladder without cholecystitis without obstruction: Secondary | ICD-10-CM

## 2018-06-02 DIAGNOSIS — F419 Anxiety disorder, unspecified: Secondary | ICD-10-CM

## 2018-06-02 DIAGNOSIS — E039 Hypothyroidism, unspecified: Secondary | ICD-10-CM

## 2018-06-02 DIAGNOSIS — K760 Fatty (change of) liver, not elsewhere classified: Secondary | ICD-10-CM

## 2018-06-02 DIAGNOSIS — N200 Calculus of kidney: Secondary | ICD-10-CM

## 2018-06-02 DIAGNOSIS — R918 Other nonspecific abnormal finding of lung field: Secondary | ICD-10-CM | POA: Insufficient documentation

## 2018-06-02 DIAGNOSIS — I10 Essential (primary) hypertension: Secondary | ICD-10-CM

## 2018-06-02 MED ORDER — ALPRAZOLAM 0.25 MG PO TABS: 0.2500 mg | ORAL_TABLET | Freq: Every day | ORAL | 2 refills | Status: DC | PRN

## 2018-06-02 MED ORDER — LOSARTAN POTASSIUM-HCTZ 100-12.5 MG PO TABS: 1.0000 | ORAL_TABLET | Freq: Every day | ORAL | 3 refills | Status: DC

## 2018-06-02 MED ORDER — LEVOTHYROXINE SODIUM 175 MCG PO TABS: 175.0000 ug | ORAL_TABLET | Freq: Every day | ORAL | 3 refills | Status: DC

## 2018-06-02 MED ORDER — AMLODIPINE BESYLATE 5 MG PO TABS: 5.0000 mg | ORAL_TABLET | ORAL | 3 refills | Status: DC

## 2018-06-02 NOTE — Patient Instructions (Addendum)
Consider CT chest in future at least w/in the year  sch ppd on Monday  F/u in 6 months   Dietary Guidelines to Help Prevent Kidney Stones Kidney stones are deposits of minerals and salts that form inside your kidneys. Your risk of developing kidney stones may be greater depending on your diet, your lifestyle, the medicines you take, and whether you have certain medical conditions. Most people can reduce their chances of developing kidney stones by following the instructions below. Depending on your overall health and the type of kidney stones you tend to develop, your dietitian may give you more specific instructions. What are tips for following this plan? Reading food labels  Choose foods with "no salt added" or "low-salt" labels. Limit your sodium intake to less than 1500 mg per day.  Choose foods with calcium for each meal and snack. Try to eat about 300 mg of calcium at each meal. Foods that contain 200-500 mg of calcium per serving include: ? 8 oz (237 ml) of milk, fortified nondairy milk, and fortified fruit juice. ? 8 oz (237 ml) of kefir, yogurt, and soy yogurt. ? 4 oz (118 ml) of tofu. ? 1 oz of cheese. ? 1 cup (300 g) of dried figs. ? 1 cup (91 g) of cooked broccoli. ? 1-3 oz can of sardines or mackerel.  Most people need 1000 to 1500 mg of calcium each day. Talk to your dietitian about how much calcium is recommended for you. Shopping  Buy plenty of fresh fruits and vegetables. Most people do not need to avoid fruits and vegetables, even if they contain nutrients that may contribute to kidney stones.  When shopping for convenience foods, choose: ? Whole pieces of fruit. ? Premade salads with dressing on the side. ? Low-fat fruit and yogurt smoothies.  Avoid buying frozen meals or prepared deli foods.  Look for foods with live cultures, such as yogurt and kefir. Cooking  Do not add salt to food when cooking. Place a salt shaker on the table and allow each person to add  his or her own salt to taste.  Use vegetable protein, such as beans, textured vegetable protein (TVP), or tofu instead of meat in pasta, casseroles, and soups. Meal planning  Eat less salt, if told by your dietitian. To do this: ? Avoid eating processed or premade food. ? Avoid eating fast food.  Eat less animal protein, including cheese, meat, poultry, or fish, if told by your dietitian. To do this: ? Limit the number of times you have meat, poultry, fish, or cheese each week. Eat a diet free of meat at least 2 days a week. ? Eat only one serving each day of meat, poultry, fish, or seafood. ? When you prepare animal protein, cut pieces into small portion sizes. For most meat and fish, one serving is about the size of one deck of cards.  Eat at least 5 servings of fresh fruits and vegetables each day. To do this: ? Keep fruits and vegetables on hand for snacks. ? Eat 1 piece of fruit or a handful of berries with breakfast. ? Have a salad and fruit at lunch. ? Have two kinds of vegetables at dinner.  Limit foods that are high in a substance called oxalate. These include: ? Spinach. ? Rhubarb. ? Beets. ? Potato chips and french fries. ? Nuts.  If you regularly take a diuretic medicine, make sure to eat at least 1-2 fruits or vegetables high in potassium each day. These  include: ? Avocado. ? Banana. ? Orange, prune, carrot, or tomato juice. ? Baked potato. ? Cabbage. ? Beans and split peas. General instructions  Drink enough fluid to keep your urine clear or pale yellow. This is the most important thing you can do.  Talk to your health care provider and dietitian about taking daily supplements. Depending on your health and the cause of your kidney stones, you may be advised: ? Not to take supplements with vitamin C. ? To take a calcium supplement. ? To take a daily probiotic supplement. ? To take other supplements such as magnesium, fish oil, or vitamin B6.  Take all  medicines and supplements as told by your health care provider.  Limit alcohol intake to no more than 1 drink a day for nonpregnant women and 2 drinks a day for men. One drink equals 12 oz of beer, 5 oz of wine, or 1 oz of hard liquor.  Lose weight if told by your health care provider. Work with your dietitian to find strategies and an eating plan that works best for you. What foods are not recommended? Limit your intake of the following foods, or as told by your dietitian. Talk to your dietitian about specific foods you should avoid based on the type of kidney stones and your overall health. Grains Breads. Bagels. Rolls. Baked goods. Salted crackers. Cereal. Pasta. Vegetables Spinach. Rhubarb. Beets. Canned vegetables. Angie Fava. Olives. Meats and other protein foods Nuts. Nut butters. Large portions of meat, poultry, or fish. Salted or cured meats. Deli meats. Hot dogs. Sausages. Dairy Cheese. Beverages Regular soft drinks. Regular vegetable juice. Seasonings and other foods Seasoning blends with salt. Salad dressings. Canned soups. Soy sauce. Ketchup. Barbecue sauce. Canned pasta sauce. Casseroles. Pizza. Lasagna. Frozen meals. Potato chips. [place] fries. Summary  You can reduce your risk of kidney stones by making changes to your diet.  The most important thing you can do is drink enough fluid. You should drink enough fluid to keep your urine clear or pale yellow.  Ask your health care provider or dietitian how much protein from animal sources you should eat each day, and also how much salt and calcium you should have each day. This information is not intended to replace advice given to you by your health care provider. Make sure you discuss any questions you have with your health care provider. Document Released: 03/26/2011 Document Revised: 11/09/2016 Document Reviewed: 11/09/2016 Elsevier Interactive Patient Education  2018 [employer].  Kidney Stones Kidney stones  (urolithiasis) are solid, rock-like deposits that form inside of the organs that make urine (kidneys). A kidney stone may form in a kidney and move into the bladder, where it can cause intense pain and block the flow of urine. Kidney stones are created when high levels of certain minerals are found in the urine. They are usually passed through urination, but in some cases, medical treatment may be needed to remove them. What are the causes? Kidney stones may be caused by:  A condition in which certain glands produce too much parathyroid hormone (primary hyperparathyroidism), which causes too much calcium buildup in the blood.  Buildup of uric acid crystals in the bladder (hyperuricosuria). Uric acid is a chemical that the body produces when you eat certain foods. It usually exits the body in the urine.  Narrowing (stricture) of one or both of the tubes that drain urine from the kidneys to the bladder (ureters).  A kidney blockage that is present at birth (congenital obstruction).  Past  surgery on the kidney or the ureters, such as gastric bypass surgery.  What increases the risk? The following factors make you more likely to develop kidney stones:  Having had a kidney stone in the past.  Having a family history of kidney stones.  Not drinking enough water.  Eating a diet that is high in protein, salt (sodium), or sugar.  Being overweight or obese.  What are the signs or symptoms? Symptoms of a kidney stone may include:  Nausea.  Vomiting.  Blood in the urine (hematuria).  Pain in the side of the abdomen, right below the ribs (flank pain). Pain usually spreads (radiates) to the groin.  Needing to urinate frequently or urgently.  How is this diagnosed? This condition may be diagnosed based on:  Your medical history.  A physical exam.  Blood tests.  Urine tests.  CT scan.  Abdominal X-ray.  A procedure to examine the inside of the bladder (cystoscopy).  How is  this treated? Treatment for kidney stones depends on the size, location, and makeup of the stones. Treatment may involve:  Analyzing your urine before and after you pass the stone through urination.  Being monitored at the hospital until you pass the stone through urination.  Increasing your fluid intake and decreasing the amount of calcium and protein in your diet.  A procedure to break up kidney stones in the bladder using: ? A focused beam of light (laser therapy). ? Shock waves (extracorporeal shock wave lithotripsy).  Surgery to remove kidney stones. This may be needed if you have severe pain or have stones that block your urinary tract.  Follow these instructions at home: Eating and drinking   Drink enough fluid to keep your urine clear or pale yellow. This will help you to pass the kidney stone.  If directed, change your diet. This may include: ? Limiting how much sodium you eat. ? Eating more fruits and vegetables. ? Limiting how much meat, poultry, fish, and eggs you eat.  Follow instructions from your health care provider about eating or drinking restrictions. General instructions  Collect urine samples as told by your health care provider. You may need to collect a urine sample: ? 24 hours after you pass the stone. ? 8-12 weeks after passing the kidney stone, and every 6-12 months after that.  Strain your urine every time you urinate, for as long as directed. Use the strainer that your health care provider recommends.  Do not throw out the kidney stone after passing it. Keep the stone so it can be tested by your health care provider. Testing the makeup of your kidney stone may help prevent you from getting kidney stones in the future.  Take over-the-counter and prescription medicines only as told by your health care provider.  Keep all follow-up visits as told by your health care provider. This is important. You may need follow-up X-rays or ultrasounds to make sure  that your stone has passed. How is this prevented? To prevent another kidney stone:  Drink enough fluid to keep your urine clear or pale yellow. This is the best way to prevent kidney stones.  Eat a healthy diet and follow recommendations from your health care provider about foods to avoid. You may be instructed to eat a low-protein diet. Recommendations vary depending on the type of kidney stone that you have.  Maintain a healthy weight.  Contact a health care provider if:  You have pain that gets worse or does not get better with  medicine. Get help right away if:  You have a fever or chills.  You develop severe pain.  You develop new abdominal pain.  You faint.  You are unable to urinate. This information is not intended to replace advice given to you by your health care provider. Make sure you discuss any questions you have with your health care provider. Document Released: 11/29/2005 Document Revised: 06/18/2016 Document Reviewed: 05/14/2016 Elsevier Interactive Patient Education  2018 [employer].   Pulmonary Nodule A pulmonary nodule is a small, round growth of tissue in the lung. Pulmonary nodules can range in size from less than 1/5 inch (4 mm) to a little bigger than an inch (25 mm). Most pulmonary nodules are detected when imaging tests of the lung are being performed for a different problem. Pulmonary nodules are usually not cancerous (benign). However, some pulmonary nodules are cancerous (malignant). Follow-up treatment or testing is based on the size of the pulmonary nodule and your risk of getting lung cancer. What are the causes? Benign pulmonary nodules can be caused by various things. Some of the causes include:  Bacterial, fungal, or viral infections. This is usually an old infection that is no longer active, but it can sometimes be a current, active infection.  A benign mass of tissue.  Inflammation from conditions such as rheumatoid arthritis.  Abnormal  blood vessels in the lungs.  Malignant pulmonary nodules can result from lung cancer or from cancers that spread to the lung from other places in the body. What are the signs or symptoms? Pulmonary nodules usually do not cause symptoms. How is this diagnosed? Most often, pulmonary nodules are found incidentally when an X-ray or CT scan is performed to look for some other problem in the lung area. To help determine whether a pulmonary nodule is benign or malignant, your health care provider will take a medical history and order a variety of tests. Tests done may include:  Blood tests.  A skin test called a tuberculin test. This test is used to determine if you have been exposed to the germ that causes tuberculosis.  Chest X-rays. If possible, a new X-ray may be compared with X-rays you have had in the past.  CT scan. This test shows smaller pulmonary nodules more clearly than an X-ray.  Positron emission tomography (PET) scan. In this test, a safe amount of a radioactive substance is injected into the bloodstream. Then, the scan takes a picture of the pulmonary nodule. The radioactive substance is eliminated from your body in your urine.  Biopsy. A tiny piece of the pulmonary nodule is removed so it can be checked under a microscope.  How is this treated? Pulmonary nodules that are benign normally do not require any treatment because they usually do not cause symptoms or breathing problems. Your health care provider may want to monitor the pulmonary nodule through follow-up CT scans. The frequency of these CT scans will vary based on the size of the nodule and the risk factors for lung cancer. For example, CT scans will need to be done more frequently if the pulmonary nodule is larger and if you have a history of smoking and a family history of cancer. Further testing or biopsies may be done if any follow-up CT scan shows that the size of the pulmonary nodule has increased. Follow these  instructions at home:  Only take over-the-counter or prescription medicines as directed by your health care provider.  Keep all follow-up appointments with your health care provider. Contact  a health care provider if:  You have trouble breathing when you are active.  You feel sick or unusually tired.  You do not feel like eating.  You lose weight without trying to.  You develop chills or night sweats. Get help right away if:  You cannot catch your breath, or you begin wheezing.  You cannot stop coughing.  You cough up blood.  You become dizzy or feel like you are going to pass out.  You have sudden chest pain.  You have a fever or persistent symptoms for more than 2-3 days.  You have a fever and your symptoms suddenly get worse. This information is not intended to replace advice given to you by your health care provider. Make sure you discuss any questions you have with your health care provider. Document Released: 09/26/2009 Document Revised: 05/06/2016 Document Reviewed: 05/21/2013 Elsevier Interactive Patient Education  2017 [employer].

## 2018-06-02 NOTE — Progress Notes (Signed)
Chief Complaint  Patient presents with  . Follow-up   F/u  1. HTN controlled on norvasc 5 mg qd, hyzaar 100-12.5 mg qd  2. Left 6 mm ureter kidney stone with mild to moderate hydronephrosis on CT 05/29/18 appt with urology Monday and on flomax she is not taking vicodin for pain. She reports she has been tx'ed with Keflex and Cipro since 02/2018 for UTI as well  3. Pulmonary nodules noted on CT ab/pelvis she reports in [place] she used to work at [employer] [facility] will check ppd test screen for TB . She is not a smoker  4. Anxiety needs refill of xanax    Review of Systems  Constitutional: Positive for weight loss.  HENT: Negative for hearing loss.   Eyes: Negative for blurred vision.  Respiratory: Negative for shortness of breath.   Cardiovascular: Negative for chest pain.  Gastrointestinal: Negative for abdominal pain.  Skin: Negative for rash.  Neurological: Negative for headaches.  Psychiatric/Behavioral: Negative for depression. The patient is nervous/anxious.    Past Medical History:  Diagnosis Date  . Anxiety   . Asthma   . HTN (hypertension)   . Hypothyroidism   . Kidney stone    05/29/18  . Low back pain   . Obesity   . Vitamin D deficiency    Past Surgical History:  Procedure Laterality Date  . APPENDECTOMY    . DILATION AND CURETTAGE OF UTERUS    . LIPOMA EXCISION     Family History  Problem Relation Age of Onset  . Alzheimer's disease Mother   . Hypertension Mother   . Cancer Mother 94       colon  . Lupus Daughter   . Heart attack Maternal Grandmother   . Breast cancer Other   . Breast cancer Maternal Aunt 50  . Bladder Cancer Neg Hx   . Kidney cancer Neg Hx    Social History   Socioeconomic History  . Marital status: Married    Spouse name: Not on file  . Number of children: 2  . Years of education: Not on file  . Highest education level: Not on file  Occupational History  . 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 Financial resource strain: Not on file  . Food insecurity:    Worry: Not on file    Inability: Not on file  . Transportation needs:    Medical: Not on file    Non-medical: Not on file  Tobacco Use  . Smoking status: Never Smoker  . Smokeless tobacco: Never Used  Substance and Sexual Activity  . Alcohol use: No    Alcohol/week: 0.0 oz  . Drug use: No  . Sexual activity: Not on file  Lifestyle  . Physical activity:    Days per week: Not on file    Minutes per session: Not on file  . Stress: Not on file  Relationships  . Social connections:    Talks on phone: Not on file    Gets together: Not on file    Attends religious service: Not on file    Active member of club or organization: Not on file    Attends meetings of clubs or organizations: Not on file    Relationship status: Not on file  . Intimate partner violence:    Fear of current or ex partner: Not on file    Emotionally abused: Not on file    Physically  abused: Not on file    Forced sexual activity: Not on file  Other Topics Concern  . Not on file  Social History Narrative   Lives in [place].   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    From [facility]   As of 11/2017 married 45 years    2 kids       Regular Exercise -  Not at this time   Daily Caffeine Use:  1 cup hot tea daily         No outpatient medications have been marked as taking for the 06/02/18 encounter (Office Visit) with McLean-Scocuzza, Nino Glow, MD.   No Known Allergies Recent Results (from the past 2160 hour(s))  Urinalysis, Complete w Microscopic     Status: Abnormal   Collection Time: 05/29/18  3:29 PM  Result Value Ref Range   Color, Urine YELLOW YELLOW   APPearance CLEAR CLEAR   Specific Gravity, Urine 1.020 1.005 - 1.030   pH 5.5 5.0 - 8.0   Glucose, UA NEGATIVE NEGATIVE mg/dL   Hgb urine dipstick TRACE (A) NEGATIVE   Bilirubin Urine NEGATIVE NEGATIVE   Ketones, ur NEGATIVE NEGATIVE mg/dL   Protein, ur NEGATIVE NEGATIVE mg/dL   Nitrite  NEGATIVE NEGATIVE   Leukocytes, UA NEGATIVE NEGATIVE   Squamous Epithelial / LPF 0-5 0 - 5   WBC, UA 0-5 0 - 5 WBC/hpf   RBC / HPF 6-10 0 - 5 RBC/hpf   Bacteria, UA NONE SEEN NONE SEEN   Mucus PRESENT     Comment: Performed at [facility], [address] [place], [place] [address]  Urine culture     Status: Abnormal   Collection Time: 05/29/18  3:29 PM  Result Value Ref Range   Specimen Description      URINE, CLEAN CATCH Performed at [facility], [address], [place], [place] [address]    Special Requests      NONE Performed at [facility], [address], [place], [place] [address]    Culture MULTIPLE SPECIES PRESENT, SUGGEST RECOLLECTION (A)    Report Status 05/31/2018 FINAL   Basic metabolic panel     Status: None   Collection Time: 05/30/18  2:45 PM  Result Value Ref Range   Glucose 90 65 - 99 mg/dL   BUN 17 8 - 27 mg/dL   Creatinine, Ser 0.90 0.57 - 1.00 mg/dL   GFR calc non Af Amer 70 >59 mL/min/1.73   GFR calc Af Amer 80 >59 mL/min/1.73   BUN/Creatinine Ratio 19 12 - 28   Sodium 144 134 - 144 mmol/L   Potassium 3.6 3.5 - 5.2 mmol/L   Chloride 106 96 - 106 mmol/L   CO2 25 20 - 29 mmol/L   Calcium 9.5 8.7 - 10.3 mg/dL  CBC with Differential/Platelet     Status: None   Collection Time: 05/30/18  2:45 PM  Result Value Ref Range   WBC 6.9 3.4 - 10.8 x10E3/uL   RBC 4.41 3.77 - 5.28 x10E6/uL   Hemoglobin 13.2 11.1 - 15.9 g/dL   Hematocrit 39.9 34.0 - 46.6 %   MCV 91 79 - 97 fL   MCH 29.9 26.6 - 33.0 pg   MCHC 33.1 31.5 - 35.7 g/dL   RDW 14.2 12.3 - 15.4 %   Platelets 270 150 - 450 x10E3/uL   Neutrophils 63 Not Estab. %   Lymphs 28 Not Estab. %   Monocytes 6 Not Estab. %  Eos 2 Not Estab. %   Basos 1 Not Estab. %   Neutrophils Absolute 4.3 1.4 - 7.0 x10E3/uL   Lymphocytes Absolute 2.0 0.7 - 3.1 x10E3/uL   Monocytes Absolute 0.4 0.1 - 0.9 x10E3/uL   EOS (ABSOLUTE) 0.2 0.0 - 0.4 x10E3/uL   Basophils Absolute 0.1 0.0  - 0.2 x10E3/uL   Immature Granulocytes 0 Not Estab. %   Immature Grans (Abs) 0.0 0.0 - 0.1 x10E3/uL   Objective  Body mass index is 43.04 kg/m. Wt Readings from Last 3 Encounters:  06/02/18 227 lb 12.8 oz (103.3 kg)  05/30/18 227 lb 14.4 oz (103.4 kg)  05/29/18 239 lb (108.4 kg)   Temp Readings from Last 3 Encounters:  06/02/18 98.4 F (36.9 C)  05/29/18 98.1 F (36.7 C) (Oral)  02/28/18 98.3 F (36.8 C) (Oral)   BP Readings from Last 3 Encounters:  06/02/18 124/84  05/30/18 134/83  05/29/18 (!) 197/105   Pulse Readings from Last 3 Encounters:  06/02/18 76  05/30/18 64  05/29/18 81    Physical Exam  Constitutional: She is oriented to person, place, and time. Vital signs are normal. She appears well-developed and well-nourished. She is cooperative.  HENT:  Head: Normocephalic and atraumatic.  Mouth/Throat: Oropharynx is clear and moist and mucous membranes are normal.  Eyes: Pupils are equal, round, and reactive to light. Conjunctivae are normal.  Cardiovascular: Normal rate, regular rhythm and normal heart sounds.  Pulmonary/Chest: Effort normal and breath sounds normal.  Neurological: She is alert and oriented to person, place, and time. Gait normal.  Skin: Skin is warm, dry and intact.  Psychiatric: She has a normal mood and affect. Her speech is normal and behavior is normal. Judgment and thought content normal. Cognition and memory are normal.  Nursing note and vitals reviewed.   Assessment   1. HTN controlled  2. 6 mm left ureter stone with mild to mod hydronephrosis 3. Lung nodules r/o TB she is not a smoker  4. Calcified GS vs GB polyp vs GB sludge h/o fatty liver ? on prior imaging [place] 2014  5. Anxiety  6. HM Plan   1.  Cont meds  2. F/u urology Monday  3.  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  Consider US abdomen in future to f/u  5. Refilled xanax  6.  Had flu  Tdap had  12/13/08 will be due 2020  pna 23 had 11/25/17  Hep B immune consider hep A vaccine see above shingrix vx at [facility] to be picked up and given by RN here   mammo neg 12/14/17  Pap neg 11/25/17 neg HPV colonoscopy had 06/07/14 Dr. Allen Norris polpys hyperplastic and tubular f/u as sch in 2020       Provider: Dr. Olivia Mackie McLean-Scocuzza-Internal Medicine

## 2018-06-05 ENCOUNTER — Ambulatory Visit
Admission: RE | Admit: 2018-06-05 | Discharge: 2018-06-05 | Disposition: A | Discharge status: Home / Self Care | Payer: 59 | Source: Ambulatory Visit | Attending: Urology | Admitting: Urology

## 2018-06-05 ENCOUNTER — Ambulatory Visit: Payer: Self-pay

## 2018-06-05 ENCOUNTER — Ambulatory Visit: Payer: 59 | Admitting: Urology

## 2018-06-05 ENCOUNTER — Telehealth: Payer: Self-pay | Admitting: Urology

## 2018-06-05 ENCOUNTER — Encounter: Payer: Self-pay | Admitting: Urology

## 2018-06-05 VITALS — BP 125/84 | HR 80 | Ht 61.0 in | Wt 228.0 lb

## 2018-06-05 DIAGNOSIS — N132 Hydronephrosis with renal and ureteral calculous obstruction: Secondary | ICD-10-CM

## 2018-06-05 DIAGNOSIS — R3129 Other microscopic hematuria: Secondary | ICD-10-CM | POA: Diagnosis not present

## 2018-06-05 DIAGNOSIS — N201 Calculus of ureter: Secondary | ICD-10-CM | POA: Diagnosis not present

## 2018-06-05 DIAGNOSIS — R918 Other nonspecific abnormal finding of lung field: Secondary | ICD-10-CM

## 2018-06-05 LAB — MICROSCOPIC EXAMINATION: RBC, UA: NONE SEEN /hpf (ref 0–2)

## 2018-06-05 LAB — URINALYSIS, COMPLETE
BILIRUBIN UA: NEGATIVE
GLUCOSE, UA: NEGATIVE
Ketones, UA: NEGATIVE
Nitrite, UA: NEGATIVE
PH UA: 5.5 (ref 5.0–7.5)
PROTEIN UA: NEGATIVE
RBC, UA: NEGATIVE
Specific Gravity, UA: 1.02 (ref 1.005–1.030)
Urobilinogen, Ur: 0.2 mg/dL (ref 0.2–1.0)

## 2018-06-05 NOTE — Telephone Encounter (Signed)
At check out pt was very concerned about her Xray orders being in her chart for her next KUB and appt next week.

## 2018-06-06 ENCOUNTER — Telehealth: Payer: Self-pay

## 2018-06-06 NOTE — Telephone Encounter (Signed)
Copied from [place] [id number]. Topic: Appointment Scheduling - Scheduling Inquiry for Clinic >> Jun 06, 2018  2:23 PM Margot Ables wrote: Reason for CRM: pt was not able to come in yesterday for nurse visit for PPD. She is needing to have done asap but works at [employer]. She had an emergency case yesterday. Pt is requesting appt late today or next week 3:30pm or after. Please call to advise

## 2018-06-07 NOTE — Telephone Encounter (Signed)
Will that be too late for nurse visit? Please advise.

## 2018-06-07 NOTE — Telephone Encounter (Signed)
Next 330 is fine.

## 2018-06-09 NOTE — Telephone Encounter (Signed)
Mychart message has been sent to inform patient. 

## 2018-06-12 ENCOUNTER — Other Ambulatory Visit: Payer: Self-pay | Admitting: Urology

## 2018-06-12 DIAGNOSIS — N201 Calculus of ureter: Secondary | ICD-10-CM

## 2018-06-12 NOTE — Progress Notes (Deleted)
06/13/2018 1:39 PM   Barbara Blake 06-25-1956 [id number]  Referring provider: McLean-Scocuzza, Nino Glow, MD [place], [place] [address]  No chief complaint on file.   HPI: Patient is a 62 year old female who was seen at [facility] urgent care on May 29, 2018 for a left distal ureteral stone.  She presented to [facility] urgent care on 05/29/2018 experiencing pain in the left side radiating to her left flank and down into her left labia.  At that time her pain was 6/10.  She was experiencing nausea.  Her UA was positive for 0-5 WBC's and 6-10 RBC's.  Urine culture is pending.    She does not have a prior history of stone.    CT Renal stone study noted 6 mm distal left ureteral stone (located just proximal to the left ureteral vesical junction) is causing mild to slightly moderate left-sided hydroureteronephrosis.  Noncalcified gallstone versus gallbladder sludge or gallbladder polyp suspected.  Sigmoid diverticulosis.  Suspect 4 cm uterine fundus fibroid.  Numerous tiny pulmonary nodules. If the patient does not have a history of cancer, tiny pulmonary nodules may be related to prior infection possibly histoplasmosis or tuberculosis or secondary to inflammatory process. No follow-up needed if patient is low-risk (and has no known or suspected primary neoplasm). Non-contrast chest CT can be considered in 12 months if patient is high-risk. This recommendation follows the consensus statement: Guidelines for Management of Incidental Pulmonary Nodules Detected on CT Images: From the Fleischner Society 2017; Radiology 2017; 284:228-243.  Aortic Atherosclerosis  Her KUB on 06/05/2018 noted a persistent stone at the left UVJ.  Her UA was negative.    Her KUB on 06/13/2018 noted a ***      PMH: Past Medical History:  Diagnosis Date  . Anxiety   . Asthma   . HTN (hypertension)   . Hypothyroidism   . Kidney stone    05/29/18  . Low back pain   . Obesity   . Vitamin D  deficiency     Surgical History: Past Surgical History:  Procedure Laterality Date  . APPENDECTOMY    . DILATION AND CURETTAGE OF UTERUS    . LIPOMA EXCISION      Home Medications:  Allergies as of 06/13/2018   No Known Allergies     Medication List        Accurate as of 06/12/18  1:39 PM. Always use your most recent med list.          albuterol 108 (90 Base) MCG/ACT inhaler Commonly known as:  PROVENTIL HFA;VENTOLIN HFA Inhale 2 puffs into the lungs every 6 (six) hours as needed for wheezing or shortness of breath.   ALPRAZolam 0.25 MG tablet Commonly known as:  XANAX Take 1 tablet (0.25 mg total) by mouth daily as needed for anxiety.   amLODipine 5 MG tablet Commonly known as:  NORVASC Take 1 tablet (5 mg total) by mouth every morning.   HYDROcodone-acetaminophen 5-325 MG tablet Commonly known as:  NORCO/VICODIN Take 1-2 tablets by mouth every 6 (six) hours as needed.   levothyroxine 175 MCG tablet Commonly known as:  SYNTHROID, LEVOTHROID Take 1 tablet (175 mcg total) by mouth daily before breakfast.   losartan-hydrochlorothiazide 100-12.5 MG tablet Commonly known as:  HYZAAR Take 1 tablet by mouth daily.   tamsulosin 0.4 MG Caps capsule Commonly known as:  FLOMAX Take 1 capsule (0.4 mg total) by mouth daily.       Allergies: No Known Allergies  Family History:  Family History  Problem Relation Age of Onset  . Alzheimer's disease Mother   . Hypertension Mother   . Cancer Mother 54       colon  . Lupus Daughter   . Heart attack Maternal Grandmother   . Breast cancer Other   . Breast cancer Maternal Aunt 53  . Bladder Cancer Neg Hx   . Kidney cancer Neg Hx     Social History:  reports that she has never smoked. She has never used smokeless tobacco. She reports that she does not drink alcohol or use drugs.  ROS:                                        Physical Exam: There were no vitals taken for this visit.    Constitutional: Well nourished. Alert and oriented, No acute distress. HEENT: [place] AT, moist mucus membranes. Trachea midline, no masses. Cardiovascular: No clubbing, cyanosis, or edema. Respiratory: Normal respiratory effort, no increased work of breathing. Skin: No rashes, bruises or suspicious lesions. Lymph: No cervical or inguinal adenopathy. Neurologic: Grossly intact, no focal deficits, moving all 4 extremities. Psychiatric: Normal mood and affect. ***  Constitutional: Well nourished. Alert and oriented, No acute distress. HEENT:  AT, moist mucus membranes. Trachea midline, no masses. Cardiovascular: No clubbing, cyanosis, or edema. Respiratory: Normal respiratory effort, no increased work of breathing. GI: Abdomen is soft, non tender, non distended, no abdominal masses. Liver and spleen not palpable.  No hernias appreciated.  Stool sample for occult testing is not indicated.   GU: No CVA tenderness.  No bladder fullness or masses.  Patient with circumcised/uncircumcised phallus. ***Foreskin easily retracted***  Urethral meatus is patent.  No penile discharge. No penile lesions or rashes. Scrotum without lesions, cysts, rashes and/or edema.  Testicles are located scrotally bilaterally. No masses are appreciated in the testicles. Left and right epididymis are normal. Rectal: Patient with  normal sphincter tone. Anus and perineum without scarring or rashes. No rectal masses are appreciated. Prostate is approximately *** grams, *** nodules are appreciated. Seminal vesicles are normal. Skin: No rashes, bruises or suspicious lesions. Lymph: No cervical or inguinal adenopathy. Neurologic: Grossly intact, no focal deficits, moving all 4 extremities. Psychiatric: Normal mood and affect.    Laboratory Data: Lab Results  Component Value Date   WBC 6.9 05/30/2018   HGB 13.2 05/30/2018   HCT 39.9 05/30/2018   MCV 91 05/30/2018   PLT 270 05/30/2018    Lab Results  Component Value  Date   CREATININE 0.90 05/30/2018    No results found for: PSA  No results found for: TESTOSTERONE  Lab Results  Component Value Date   HGBA1C 5.5 11/24/2016    Lab Results  Component Value Date   TSH 1.68 11/30/2017       Component Value Date/Time   CHOL 174 11/30/2017 1407   HDL 50.50 11/30/2017 1407   CHOLHDL 3 11/30/2017 1407   VLDL 19.4 11/30/2017 1407   LDLCALC 104 (H) 11/30/2017 1407    Lab Results  Component Value Date   AST 17 11/30/2017   Lab Results  Component Value Date   ALT 24 11/30/2017   No components found for: ALKALINEPHOPHATASE No components found for: BILIRUBINTOTAL  No results found for: ESTRADIOL  Urinalysis    Component Value Date/Time   COLORURINE YELLOW 05/29/2018 1529   APPEARANCEUR Clear 06/05/2018 0825   LABSPEC 1.020  05/29/2018 1529   PHURINE 5.5 05/29/2018 1529   GLUCOSEU Negative 06/05/2018 0825   HGBUR TRACE (A) 05/29/2018 1529   BILIRUBINUR Negative 06/05/2018 0825   KETONESUR NEGATIVE 05/29/2018 1529   PROTEINUR Negative 06/05/2018 0825   PROTEINUR NEGATIVE 05/29/2018 1529   NITRITE Negative 06/05/2018 0825   NITRITE NEGATIVE 05/29/2018 1529   LEUKOCYTESUR Trace (A) 06/05/2018 0825    I have reviewed the labs.   Pertinent Imaging: *** I have independently reviewed the films with patient  Assessment & Plan:    1. Left ureteral stone  Patient is asymptomatic at this time Will continue with MET as she is very concerned regarding the cost of further treatments She will return in 1 week for KUB and symptom recheck  She will continue to [place] to contact our office or seek treatment in the ED if becomes febrile or pain/ vomiting are difficult control in order to arrange for emergent/urgent intervention   2. Left hydronephrosis Will obtain RUS to ensure the hydronephrosis has resolved once they have passed and/or recovered from procedure to ensure to iatrogenic hydronephrosis remains - it is explained to  the patient that it is important to document resolution of the hydronephrosis as "silent hydronephrosis" can occur and cause damage and/or loss of the kidney  3. Microscopic hematuria No AMH on today's UA  4. Pulmonary nodules Has been seen by Dr. Terese Door and is being worked up for pulmonary nodules   No follow-ups on file.  These notes generated with voice recognition software. I apologize for typographical errors.  Zara Council, PA-C  [facility] [address]  [place], [place] [address] [phone]

## 2018-06-12 NOTE — Progress Notes (Signed)
KUB orders are in.

## 2018-06-13 ENCOUNTER — Ambulatory Visit: Payer: 59 | Admitting: Urology

## 2018-06-15 ENCOUNTER — Emergency Department: Payer: 59

## 2018-06-15 ENCOUNTER — Emergency Department
Admission: EM | Admit: 2018-06-15 | Discharge: 2018-06-15 | Disposition: A | Discharge status: Home / Self Care | Payer: 59 | Attending: Student in an Organized Health Care Education/Training Program | Admitting: Student in an Organized Health Care Education/Training Program

## 2018-06-15 DIAGNOSIS — N201 Calculus of ureter: Secondary | ICD-10-CM | POA: Diagnosis not present

## 2018-06-15 DIAGNOSIS — E039 Hypothyroidism, unspecified: Secondary | ICD-10-CM | POA: Insufficient documentation

## 2018-06-15 DIAGNOSIS — R1012 Left upper quadrant pain: Secondary | ICD-10-CM | POA: Diagnosis present

## 2018-06-15 DIAGNOSIS — R109 Unspecified abdominal pain: Secondary | ICD-10-CM | POA: Diagnosis not present

## 2018-06-15 DIAGNOSIS — N1339 Other hydronephrosis: Secondary | ICD-10-CM | POA: Diagnosis not present

## 2018-06-15 DIAGNOSIS — J45909 Unspecified asthma, uncomplicated: Secondary | ICD-10-CM | POA: Diagnosis not present

## 2018-06-15 DIAGNOSIS — I10 Essential (primary) hypertension: Secondary | ICD-10-CM | POA: Diagnosis not present

## 2018-06-15 DIAGNOSIS — Z79899 Other long term (current) drug therapy: Secondary | ICD-10-CM | POA: Diagnosis not present

## 2018-06-15 DIAGNOSIS — N202 Calculus of kidney with calculus of ureter: Secondary | ICD-10-CM | POA: Diagnosis not present

## 2018-06-15 LAB — BASIC METABOLIC PANEL
Anion gap: 9 (ref 5–15)
BUN: 21 mg/dL — AB (ref 6–20)
CHLORIDE: 104 mmol/L (ref 98–111)
CO2: 24 mmol/L (ref 22–32)
Calcium: 9.3 mg/dL (ref 8.9–10.3)
Creatinine, Ser: 1.47 mg/dL — ABNORMAL HIGH (ref 0.44–1.00)
GFR calc Af Amer: 44 mL/min — ABNORMAL LOW (ref >60)
GFR calc non Af Amer: 38 mL/min — ABNORMAL LOW (ref >60)
Glucose, Bld: 152 mg/dL — ABNORMAL HIGH (ref 70–99)
POTASSIUM: 3.7 mmol/L (ref 3.5–5.1)
SODIUM: 137 mmol/L (ref 135–145)

## 2018-06-15 LAB — CBC WITH DIFFERENTIAL/PLATELET
Basophils Absolute: 0.1 10*3/uL (ref 0–0.1)
Basophils Relative: 1 %
EOS ABS: 0 10*3/uL (ref 0–0.7)
Eosinophils Relative: 1 %
HCT: 41 % (ref 35.0–47.0)
HEMOGLOBIN: 14.5 g/dL (ref 12.0–16.0)
LYMPHS ABS: 1 10*3/uL (ref 1.0–3.6)
LYMPHS PCT: 12 %
MCH: 31.6 pg (ref 26.0–34.0)
MCHC: 35.3 g/dL (ref 32.0–36.0)
MCV: 89.4 fL (ref 80.0–100.0)
MONOS PCT: 4 %
Monocytes Absolute: 0.4 10*3/uL (ref 0.2–0.9)
NEUTROS ABS: 7 10*3/uL — AB (ref 1.4–6.5)
Neutrophils Relative %: 82 %
Platelets: 225 10*3/uL (ref 150–440)
RBC: 4.59 MIL/uL (ref 3.80–5.20)
RDW: 13.8 % (ref 11.5–14.5)
WBC: 8.5 10*3/uL (ref 3.6–11.0)

## 2018-06-15 LAB — URINALYSIS, COMPLETE (UACMP) WITH MICROSCOPIC
Bilirubin Urine: NEGATIVE
GLUCOSE, UA: NEGATIVE mg/dL
Hgb urine dipstick: NEGATIVE
Ketones, ur: NEGATIVE mg/dL
Nitrite: NEGATIVE
PH: 6 (ref 5.0–8.0)
Protein, ur: NEGATIVE mg/dL
SPECIFIC GRAVITY, URINE: 1.006 (ref 1.005–1.030)

## 2018-06-15 MED ORDER — HYDROCODONE-ACETAMINOPHEN 5-325 MG PO TABS: 1.0000 | ORAL_TABLET | ORAL | 0 refills | Status: DC | PRN

## 2018-06-15 MED ORDER — ONDANSETRON HCL 4 MG PO TABS: 4.0000 mg | ORAL_TABLET | Freq: Every day | ORAL | 0 refills | Status: DC | PRN

## 2018-06-15 MED ORDER — SODIUM CHLORIDE 0.9 % IV BOLUS
1000.0000 mL | Freq: Once | INTRAVENOUS | Status: AC
  Administered 2018-06-15: 1000 mL via INTRAVENOUS

## 2018-06-15 MED ORDER — PROMETHAZINE HCL 25 MG/ML IJ SOLN
12.5000 mg | Freq: Four times a day (QID) | INTRAMUSCULAR | Status: DC | PRN
  Administered 2018-06-15: 12.5 mg via INTRAVENOUS
  Filled 2018-06-15: qty 1

## 2018-06-15 MED ORDER — KETOROLAC TROMETHAMINE 30 MG/ML IJ SOLN
15.0000 mg | Freq: Once | INTRAMUSCULAR | Status: AC
  Administered 2018-06-15: 15 mg via INTRAVENOUS
  Filled 2018-06-15: qty 1

## 2018-06-15 MED ORDER — MORPHINE SULFATE (PF) 4 MG/ML IV SOLN
4.0000 mg | INTRAVENOUS | Status: DC | PRN
  Administered 2018-06-15 (×2): 4 mg via INTRAVENOUS
  Filled 2018-06-15 (×2): qty 1

## 2018-06-15 NOTE — ED Triage Notes (Signed)
Pt states she was seen a couple of weeks ago and was dx with kidney stone.  Pt reports that she saw granules in her urine since, but then she started having pain again around 10 am that has progressively gotten worse.  Pt is A&Ox4, appears in pain.

## 2018-06-15 NOTE — ED Provider Notes (Signed)
[facility] Emergency Department Provider Note    First MD Initiated Contact with Patient 06/15/18 1919     (approximate)  I have reviewed the triage vital signs and the nursing notes.   HISTORY  Chief Complaint Nephrolithiasis and Flank Pain    HPI Barbara Blake is a 62 y.o. female with fairly recent diagnosis of left ureteral stone followed by urology pre-presents to the ER today with acute left flank pain to moderate to severe associated with nausea.  No measured fevers.  She is not currently on any antibiotics.  Denies any dysuria.  Based on the size a stone they had hoped that it would pass spontaneously.  Does have a history of hydronephrosis secondary to this stone.  Has been on Flomax without any improvement in symptoms.  States the pain occurred suddenly while she was cooking dinner for July 4 per family.  Denies any trauma.    Past Medical History:  Diagnosis Date  . Anxiety   . Asthma   . HTN (hypertension)   . Hypothyroidism   . Kidney stone    05/29/18  . Low back pain   . Obesity   . Vitamin D deficiency    Family History  Problem Relation Age of Onset  . Alzheimer's disease Mother   . Hypertension Mother   . Cancer Mother 69       colon  . Lupus Daughter   . Heart attack Maternal Grandmother   . Breast cancer Other   . Breast cancer Maternal Aunt 57  . Bladder Cancer Neg Hx   . Kidney cancer Neg Hx    Past Surgical History:  Procedure Laterality Date  . APPENDECTOMY    . DILATION AND CURETTAGE OF UTERUS    . LIPOMA EXCISION     Patient Active Problem List   Diagnosis Date Noted  . Pulmonary nodules 06/02/2018  . Kidney stones 05/29/2018  . Fatty liver 01/27/2018  . HLD (hyperlipidemia) 12/16/2017  . Midline low back pain with bilateral sciatica 11/28/2017  . Asthma, well controlled 11/28/2017  . Gastroesophageal reflux disease 11/28/2017  . Postnasal drip 11/28/2017  . Hoarseness of voice 11/28/2017  . Asthma,  mild intermittent 06/30/2016  . Positive ANA (antinuclear antibody) 02/20/2016  . Arthralgia 02/20/2016  . Vitamin D deficiency 02/20/2016  . Cholelithiasis 04/25/2014  . Severe obesity (BMI >= 40) ([place]) 04/25/2014  . Routine general medical examination at a health care facility 04/04/2014  . Left sided sciatica 04/04/2014  . Screen for colon cancer 04/04/2014  . Hypertension 11/19/2011  . Hypothyroidism 11/19/2011  . Anxiety 11/19/2011      Prior to Admission medications   Medication Sig Start Date End Date Taking? Authorizing Provider  albuterol (PROVENTIL HFA;VENTOLIN HFA) 108 (90 Base) MCG/ACT inhaler Inhale 2 puffs into the lungs every 6 (six) hours as needed for wheezing or shortness of breath. Patient not taking: Reported on 05/30/2018 06/30/16   Jackolyn Confer, MD  ALPRAZolam Duanne Moron) 0.25 MG tablet Take 1 tablet (0.25 mg total) by mouth daily as needed for anxiety. 06/02/18   McLean-Scocuzza, Nino Glow, MD  amLODipine (NORVASC) 5 MG tablet Take 1 tablet (5 mg total) by mouth every morning. 06/02/18   McLean-Scocuzza, Nino Glow, MD  HYDROcodone-acetaminophen (NORCO) 5-325 MG tablet Take 1 tablet by mouth every 4 (four) hours as needed for moderate pain. 06/15/18   Merlyn Lot, MD  HYDROcodone-acetaminophen (NORCO/VICODIN) 5-325 MG tablet Take 1-2 tablets by mouth every 6 (six) hours as  needed. 05/29/18   Lorin Picket, PA-C  levothyroxine (SYNTHROID, LEVOTHROID) 175 MCG tablet Take 1 tablet (175 mcg total) by mouth daily before breakfast. 06/02/18   McLean-Scocuzza, Nino Glow, MD  losartan-hydrochlorothiazide (HYZAAR) 100-12.5 MG tablet Take 1 tablet by mouth daily. 06/02/18   McLean-Scocuzza, Nino Glow, MD  ondansetron (ZOFRAN) 4 MG tablet Take 1 tablet (4 mg total) by mouth daily as needed for nausea or vomiting. 06/15/18 06/15/19  Merlyn Lot, MD  tamsulosin (FLOMAX) 0.4 MG CAPS capsule Take 1 capsule (0.4 mg total) by mouth daily. 05/30/18   Zara Council A, PA-C     Allergies Patient has no known allergies.    Social History Social History   Tobacco Use  . Smoking status: Never Smoker  . Smokeless tobacco: Never Used  Substance Use Topics  . Alcohol use: No    Alcohol/week: 0.0 oz  . Drug use: No    Review of Systems Patient denies headaches, rhinorrhea, blurry vision, numbness, shortness of breath, chest pain, edema, cough, abdominal pain, nausea, vomiting, diarrhea, dysuria, fevers, rashes or hallucinations unless otherwise stated above in HPI. ____________________________________________   PHYSICAL EXAM:  VITAL SIGNS: Vitals:   06/15/18 2208 06/15/18 2325  BP:  125/68  Pulse: 75 61  Resp:    Temp:    SpO2: 96% 97%    Constitutional: Alert and oriented.  Eyes: Conjunctivae are normal.  Head: Atraumatic. Nose: No congestion/rhinnorhea. Mouth/Throat: Mucous membranes are moist.   Neck: No stridor. Painless ROM.  Cardiovascular: Normal rate, regular rhythm. Grossly normal heart sounds.  Good peripheral circulation. Respiratory: Normal respiratory effort.  No retractions. Lungs CTAB. Gastrointestinal: Soft and nontender. No distention. No abdominal bruits. + left CVA tenderness. Genitourinary: deferred Musculoskeletal: No lower extremity tenderness nor edema.  No joint effusions. Neurologic:  Normal speech and language. No gross focal neurologic deficits are appreciated. No facial droop Skin:  Skin is warm, dry and intact. No rash noted. Psychiatric: Mood and affect are normal. Speech and behavior are normal.  ____________________________________________   LABS (all labs ordered are listed, but only abnormal results are displayed)  Results for orders placed or performed during the hospital encounter of 06/15/18 (from the past 24 hour(s))  Urinalysis, Complete w Microscopic     Status: Abnormal   Collection Time: 06/15/18  7:28 PM  Result Value Ref Range   Color, Urine STRAW (A) YELLOW   APPearance CLEAR (A) CLEAR    Specific Gravity, Urine 1.006 1.005 - 1.030   pH 6.0 5.0 - 8.0   Glucose, UA NEGATIVE NEGATIVE mg/dL   Hgb urine dipstick NEGATIVE NEGATIVE   Bilirubin Urine NEGATIVE NEGATIVE   Ketones, ur NEGATIVE NEGATIVE mg/dL   Protein, ur NEGATIVE NEGATIVE mg/dL   Nitrite NEGATIVE NEGATIVE   Leukocytes, UA TRACE (A) NEGATIVE   RBC / HPF 0-5 0 - 5 RBC/hpf   WBC, UA 6-10 0 - 5 WBC/hpf   Bacteria, UA RARE (A) NONE SEEN   Squamous Epithelial / LPF 0-5 0 - 5   Mucus PRESENT   CBC with Differential/Platelet     Status: Abnormal   Collection Time: 06/15/18  7:28 PM  Result Value Ref Range   WBC 8.5 3.6 - 11.0 K/uL   RBC 4.59 3.80 - 5.20 MIL/uL   Hemoglobin 14.5 12.0 - 16.0 g/dL   HCT 41.0 35.0 - 47.0 %   MCV 89.4 80.0 - 100.0 fL   MCH 31.6 26.0 - 34.0 pg   MCHC 35.3 32.0 - 36.0 g/dL  RDW 13.8 11.5 - 14.5 %   Platelets 225 150 - 440 K/uL   Neutrophils Relative % 82 %   Neutro Abs 7.0 (H) 1.4 - 6.5 K/uL   Lymphocytes Relative 12 %   Lymphs Abs 1.0 1.0 - 3.6 K/uL   Monocytes Relative 4 %   Monocytes Absolute 0.4 0.2 - 0.9 K/uL   Eosinophils Relative 1 %   Eosinophils Absolute 0.0 0 - 0.7 K/uL   Basophils Relative 1 %   Basophils Absolute 0.1 0 - 0.1 K/uL  Basic metabolic panel     Status: Abnormal   Collection Time: 06/15/18  7:28 PM  Result Value Ref Range   Sodium 137 135 - 145 mmol/L   Potassium 3.7 3.5 - 5.1 mmol/L   Chloride 104 98 - 111 mmol/L   CO2 24 22 - 32 mmol/L   Glucose, Bld 152 (H) 70 - 99 mg/dL   BUN 21 (H) 6 - 20 mg/dL   Creatinine, Ser 1.47 (H) 0.44 - 1.00 mg/dL   Calcium 9.3 8.9 - 10.3 mg/dL   GFR calc non Af Amer 38 (L) >60 mL/min   GFR calc Af Amer 44 (L) >60 mL/min   Anion gap 9 5 - 15   ____________________________________________ ____________________________________________  RADIOLOGY  I personally reviewed all radiographic images ordered to evaluate for the above acute complaints and reviewed radiology reports and findings.  These findings were personally  discussed with the patient.  Please see medical record for radiology report.  ____________________________________________   PROCEDURES  Procedure(s) performed:  Procedures    Critical Care performed: no ____________________________________________   INITIAL IMPRESSION / ASSESSMENT AND PLAN / ED COURSE  Pertinent labs & imaging results that were available during my care of the patient were reviewed by me and considered in my medical decision making (see chart for details).   DDX: stone, pyelo, uti, msk strain, colitis  ZIYAN HILLMER is a 62 y.o. who presents to the ED with symptoms as described above.  Patient has known left renal stone and symptoms most consistent with that.  No fever.  Urinalysis does not show evidence of infection.  No white count to suggest infectious process.  Will provide IV pain control as well as IV fluids.  Patient does have mild AKI but no metabolic derangement from this.  Possibly secondary to dehydration.  Will order ultrasound to evaluate for worsening hydronephrosis or other renal parenchymal disease.  Discussed AKI renal ultrasound presentation with Dr. Gloriann Loan of urology.  Patient does not meet indication for emergent decompression will reassess to evaluate if patient requires admission for additional IV fluids and pain control or she can be discharged home for outpatient follow-up  Clinical Course as of Jun 15 2332  Thu Jun 15, 2018  2309 Patient reassessed him pain is completely resolved.   [PR]    Clinical Course User Index [PR] Merlyn Lot, MD     As part of my medical decision making, I reviewed the following data within the [facility] notes reviewed and incorporated, Labs reviewed, notes from prior ED visits.   ____________________________________________   FINAL CLINICAL IMPRESSION(S) / ED DIAGNOSES  Final diagnoses:  Left flank pain  Ureterolithiasis      NEW MEDICATIONS STARTED DURING THIS  VISIT:  New Prescriptions   HYDROCODONE-ACETAMINOPHEN (NORCO) 5-325 MG TABLET    Take 1 tablet by mouth every 4 (four) hours as needed for moderate pain.   ONDANSETRON (ZOFRAN) 4 MG TABLET  Take 1 tablet (4 mg total) by mouth daily as needed for nausea or vomiting.     Note:  This document was prepared using Dragon voice recognition software and may include unintentional dictation errors.    Merlyn Lot, MD 06/15/18 [phone]

## 2018-06-15 NOTE — ED Notes (Signed)

## 2018-06-15 NOTE — ED Notes (Signed)
Patient transported to Ultrasound 

## 2018-06-15 NOTE — ED Notes (Signed)
Patient transported to X-ray 

## 2018-06-20 ENCOUNTER — Other Ambulatory Visit: Payer: Self-pay | Admitting: Radiology

## 2018-06-20 DIAGNOSIS — N201 Calculus of ureter: Secondary | ICD-10-CM

## 2018-06-21 ENCOUNTER — Encounter: Payer: Self-pay | Admitting: *Deleted

## 2018-06-22 ENCOUNTER — Ambulatory Visit
Admission: RE | Admit: 2018-06-22 | Discharge: 2018-06-22 | Disposition: A | Discharge status: Home / Self Care | Payer: 59 | Source: Ambulatory Visit | Attending: Urology | Admitting: Urology

## 2018-06-22 ENCOUNTER — Encounter
Admission: RE | Disposition: A | Discharge status: Home / Self Care | Payer: Self-pay | Source: Ambulatory Visit | Attending: Urology

## 2018-06-22 ENCOUNTER — Encounter: Payer: Self-pay | Admitting: *Deleted

## 2018-06-22 ENCOUNTER — Ambulatory Visit: Payer: 59

## 2018-06-22 ENCOUNTER — Other Ambulatory Visit: Payer: Self-pay

## 2018-06-22 DIAGNOSIS — E039 Hypothyroidism, unspecified: Secondary | ICD-10-CM | POA: Diagnosis not present

## 2018-06-22 DIAGNOSIS — N201 Calculus of ureter: Secondary | ICD-10-CM | POA: Diagnosis not present

## 2018-06-22 DIAGNOSIS — E669 Obesity, unspecified: Secondary | ICD-10-CM | POA: Insufficient documentation

## 2018-06-22 DIAGNOSIS — J45909 Unspecified asthma, uncomplicated: Secondary | ICD-10-CM | POA: Diagnosis not present

## 2018-06-22 DIAGNOSIS — I10 Essential (primary) hypertension: Secondary | ICD-10-CM | POA: Diagnosis not present

## 2018-06-22 DIAGNOSIS — Z6841 Body Mass Index (BMI) 40.0 and over, adult: Secondary | ICD-10-CM | POA: Diagnosis not present

## 2018-06-22 HISTORY — PX: EXTRACORPOREAL SHOCK WAVE LITHOTRIPSY: SHX1557

## 2018-06-22 SURGERY — LITHOTRIPSY, ESWL
Anesthesia: Moderate Sedation | Laterality: Left

## 2018-06-22 MED ORDER — CIPROFLOXACIN HCL 500 MG PO TABS
ORAL_TABLET | ORAL | Status: AC
  Administered 2018-06-22: 500 mg via ORAL
  Filled 2018-06-22: qty 1

## 2018-06-22 MED ORDER — DIAZEPAM 5 MG PO TABS
ORAL_TABLET | ORAL | Status: AC
  Administered 2018-06-22: 10 mg via ORAL
  Filled 2018-06-22: qty 2

## 2018-06-22 MED ORDER — DIPHENHYDRAMINE HCL 25 MG PO CAPS
25.0000 mg | ORAL_CAPSULE | ORAL | Status: AC
  Administered 2018-06-22: 25 mg via ORAL

## 2018-06-22 MED ORDER — ONDANSETRON HCL 4 MG/2ML IJ SOLN
4.0000 mg | Freq: Once | INTRAMUSCULAR | Status: AC | PRN
  Administered 2018-06-22: 4 mg via INTRAVENOUS

## 2018-06-22 MED ORDER — CIPROFLOXACIN HCL 500 MG PO TABS
500.0000 mg | ORAL_TABLET | ORAL | Status: AC
  Administered 2018-06-22: 500 mg via ORAL

## 2018-06-22 MED ORDER — SODIUM CHLORIDE 0.9 % IV SOLN
INTRAVENOUS | Status: DC
  Administered 2018-06-22: 07:00:00 via INTRAVENOUS

## 2018-06-22 MED ORDER — ONDANSETRON HCL 4 MG/2ML IJ SOLN
INTRAMUSCULAR | Status: AC
  Administered 2018-06-22: 4 mg via INTRAVENOUS
  Filled 2018-06-22: qty 2

## 2018-06-22 MED ORDER — DIAZEPAM 5 MG PO TABS
10.0000 mg | ORAL_TABLET | ORAL | Status: AC
  Administered 2018-06-22: 10 mg via ORAL

## 2018-06-22 MED ORDER — DIPHENHYDRAMINE HCL 25 MG PO CAPS
ORAL_CAPSULE | ORAL | Status: AC
  Administered 2018-06-22: 25 mg via ORAL
  Filled 2018-06-22: qty 1

## 2018-06-22 NOTE — Discharge Instructions (Signed)
AMBULATORY SURGERY  DISCHARGE INSTRUCTIONS   1) The drugs that you were given will stay in your system until tomorrow so for the next 24 hours you should not:  A) Drive an automobile B) Make any legal decisions C) Drink any alcoholic beverage   2) You may resume regular meals tomorrow.  Today it is better to start with liquids and gradually work up to solid foods.  You may eat anything you prefer, but it is better to start with liquids, then soup and crackers, and gradually work up to solid foods.   3) Please notify your doctor immediately if you have any unusual bleeding, trouble breathing, redness and pain at the surgery site, drainage, fever, or pain not relieved by medication.    4) Additional Instructions:   FOLLOW [facility] PROCEDURE INSTRUCTIONS AS REVIEWED.        Please contact your physician with any problems or Same Day Surgery at [phone], Monday through Friday 6 am to 4 pm, or [place] [facility] number at [phone].

## 2018-06-23 ENCOUNTER — Encounter: Payer: Self-pay | Admitting: Urology

## 2018-07-14 ENCOUNTER — Encounter: Payer: Self-pay | Admitting: Urology

## 2018-07-14 ENCOUNTER — Ambulatory Visit (INDEPENDENT_AMBULATORY_CARE_PROVIDER_SITE_OTHER): Payer: 59 | Admitting: Urology

## 2018-07-14 VITALS — BP 123/85 | HR 67 | Ht 61.0 in | Wt 224.0 lb

## 2018-07-14 DIAGNOSIS — Z87442 Personal history of urinary calculi: Secondary | ICD-10-CM

## 2018-07-14 NOTE — Progress Notes (Signed)
07/14/2018 2:13 PM   Barbara Blake 12/06/1956 [id number]  Referring provider: McLean-Scocuzza, Nino Glow, MD [place], [place] [address]  Chief Complaint  Patient presents with  . Follow-up    KUB    HPI: 62 year old female status post shockwave lithotripsy of a 6 mm left distal ureteral calculus on 06/22/2018.  She passed several small stone fragments which she brings in today.  She had no postoperative problems and her pain has resolved.  She has not had a follow-up KUB.  This is her first stone and no nephrolithiasis on CT.   PMH: Past Medical History:  Diagnosis Date  . Anxiety   . Asthma   . HTN (hypertension)   . Hypothyroidism   . Kidney stone    05/29/18  . Low back pain   . Obesity   . Vitamin D deficiency     Surgical History: Past Surgical History:  Procedure Laterality Date  . APPENDECTOMY    . DILATION AND CURETTAGE OF UTERUS    . EXTRACORPOREAL SHOCK WAVE LITHOTRIPSY Left 06/22/2018   Procedure: EXTRACORPOREAL SHOCK WAVE LITHOTRIPSY (ESWL);  Surgeon: Abbie Sons, MD;  Location: [facility] ORS;  Service: Urology;  Laterality: Left;  . LIPOMA EXCISION      Home Medications:  Allergies as of 07/14/2018   No Known Allergies     Medication List        Accurate as of 07/14/18  2:13 PM. Always use your most recent med list.          amLODipine 5 MG tablet Commonly known as:  NORVASC Take 1 tablet (5 mg total) by mouth every morning.   HYDROcodone-acetaminophen 5-325 MG tablet Commonly known as:  NORCO Take 1 tablet by mouth every 4 (four) hours as needed for moderate pain.   levothyroxine 175 MCG tablet Commonly known as:  SYNTHROID, LEVOTHROID Take 1 tablet (175 mcg total) by mouth daily before breakfast.   losartan-hydrochlorothiazide 100-12.5 MG tablet Commonly known as:  HYZAAR Take 1 tablet by mouth daily.   tamsulosin 0.4 MG Caps capsule Commonly known as:  FLOMAX Take 1 capsule (0.4 mg total) by mouth daily.        Allergies: No Known Allergies  Family History: Family History  Problem Relation Age of Onset  . Alzheimer's disease Mother   . Hypertension Mother   . Cancer Mother 49       colon  . Lupus Daughter   . Heart attack Maternal Grandmother   . Breast cancer Other   . Breast cancer Maternal Aunt 42  . Bladder Cancer Neg Hx   . Kidney cancer Neg Hx     Social History:  reports that she has never smoked. She has never used smokeless tobacco. She reports that she does not drink alcohol or use drugs.  ROS: UROLOGY Frequent Urination?: No Hard to postpone urination?: No Burning/pain with urination?: No Get up at night to urinate?: No Leakage of urine?: No Urine stream starts and stops?: No Trouble starting stream?: No Do you have to strain to urinate?: No Blood in urine?: No Urinary tract infection?: No Sexually transmitted disease?: No Injury to kidneys or bladder?: No Painful intercourse?: No Weak stream?: No Currently pregnant?: No Vaginal bleeding?: No Last menstrual period?: n  Gastrointestinal Nausea?: No Vomiting?: No Indigestion/heartburn?: No Diarrhea?: No Constipation?: No  Constitutional Fever: No Night sweats?: No Weight loss?: No Fatigue?: No  Skin Skin rash/lesions?: No Itching?: No  Eyes Blurred vision?: No Double vision?: No  Ears/Nose/Throat Sore throat?: No Sinus problems?: No  Hematologic/Lymphatic Swollen glands?: No Easy bruising?: No  Cardiovascular Leg swelling?: No Chest pain?: No  Respiratory Cough?: No Shortness of breath?: No  Endocrine Excessive thirst?: No  Musculoskeletal Back pain?: No Joint pain?: No  Neurological Headaches?: No Dizziness?: No  Psychologic Depression?: No Anxiety?: No  Physical Exam: BP 123/85   Pulse 67   Ht 5\' 1"  (1.549 m)   Wt 224 lb (101.6 kg)   BMI 42.32 kg/m   Constitutional:  Alert and oriented, No acute distress.    Assessment & Plan:   Doing well status post  shockwave lithotripsy of a distal ureteral calculus.  She will have her KUB today or next week and will be notified with the results.  She is a first-time stone former and we discussed general stone prevention guidelines versus a metabolic evaluation.  She has elected the former.  It was recommended she increase her water intake keep urine output greater than 2 L/day.  Dietary oxalate moderation as well as a low-sodium diet and increasing dietary citrate were all discussed.  She was provided literature on stone prevention.   Return in about 6 months (around 01/14/2019) for Recheck, KUB.  Abbie Sons, [place] [address], [place], [place] [address] [phone]

## 2018-07-18 ENCOUNTER — Ambulatory Visit
Admission: RE | Admit: 2018-07-18 | Discharge: 2018-07-18 | Disposition: A | Discharge status: Home / Self Care | Payer: 59 | Source: Ambulatory Visit | Attending: Urology | Admitting: Urology

## 2018-07-18 DIAGNOSIS — N201 Calculus of ureter: Secondary | ICD-10-CM

## 2018-07-19 DIAGNOSIS — N200 Calculus of kidney: Secondary | ICD-10-CM | POA: Diagnosis not present

## 2018-07-25 ENCOUNTER — Other Ambulatory Visit: Payer: Self-pay | Admitting: Urology

## 2018-07-27 NOTE — Progress Notes (Signed)
Stone analysis was primarily calcium oxalate which is the most common type of stone.

## 2018-08-24 ENCOUNTER — Telehealth: Payer: Self-pay | Admitting: Internal Medicine

## 2018-08-24 NOTE — Telephone Encounter (Signed)
FYI

## 2018-08-24 NOTE — Telephone Encounter (Signed)
Copied from  [id number]. Topic: Quick Communication - See Telephone Encounter >> Aug 24, 2018  3:28 PM Blase Mess A wrote: CRM for notification. See Telephone encounter for: 08/24/18.  Patient wanted to let Dr. Linus Orn know that she received her Flu vaccine 07/21/18.

## 2018-10-08 IMAGING — US US RENAL
1 series · 14 of 25 positions shown · non-contrast
Comparison: 06/15/2018 radiographs and 05/29/2018 CT

CLINICAL DATA: 61-year-old female with acute LEFT flank pain.
History of recent LEFT UVJ calculus.

EXAM:
RENAL / URINARY TRACT ULTRASOUND COMPLETE

[Series 1: us renal · 0.26mm/px · 14 of 45 slices shown]
[im 1/45]
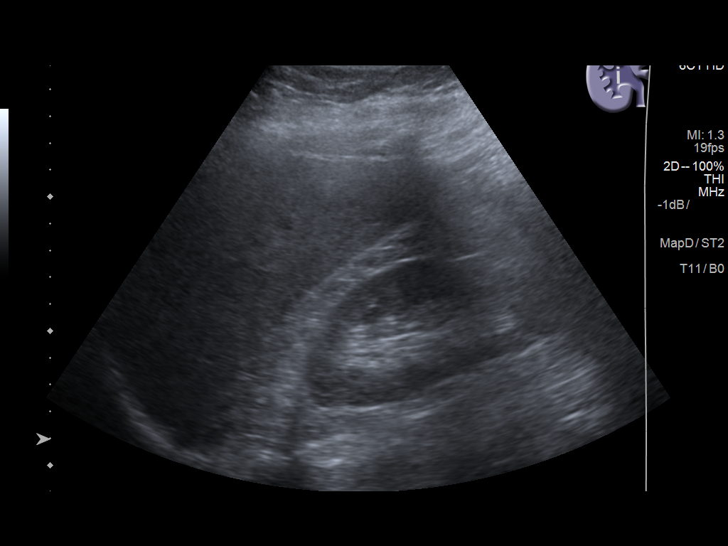
[im 4/45]
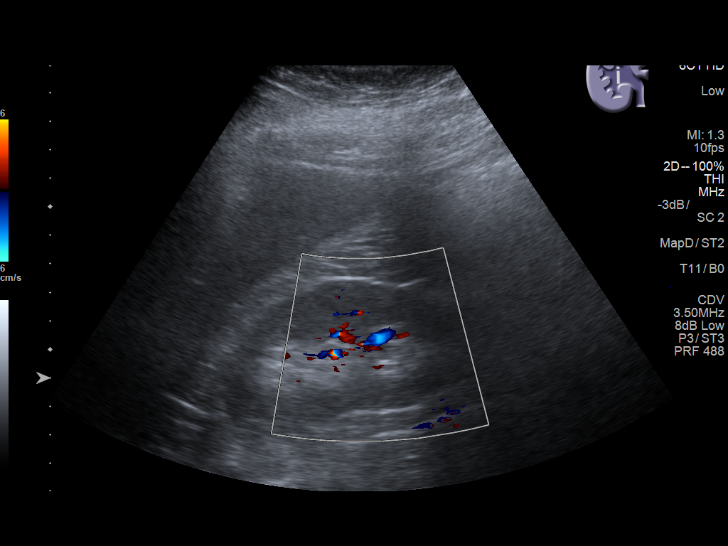
[im 8/45]
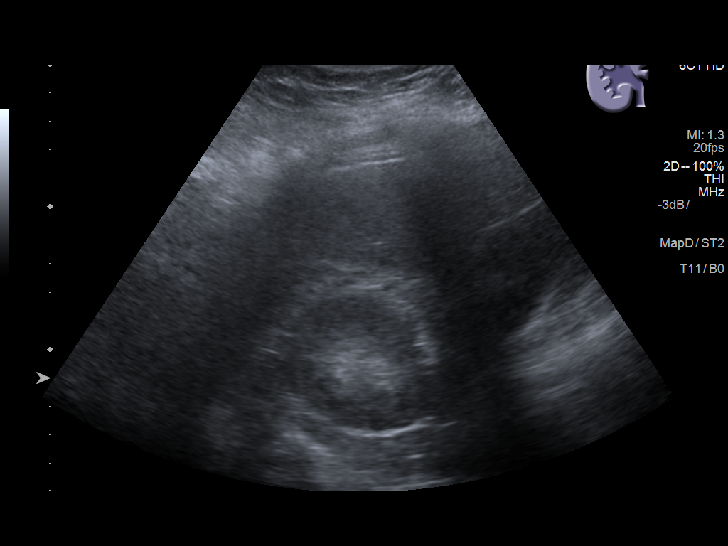
[im 12/45]
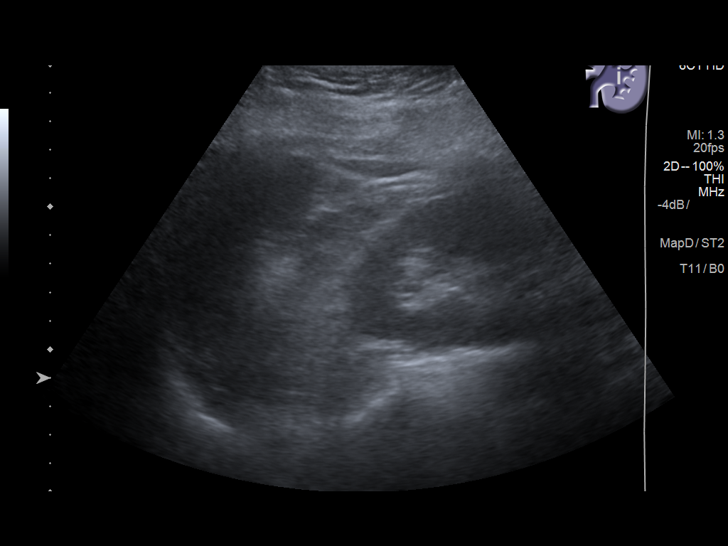
[im 15/45]
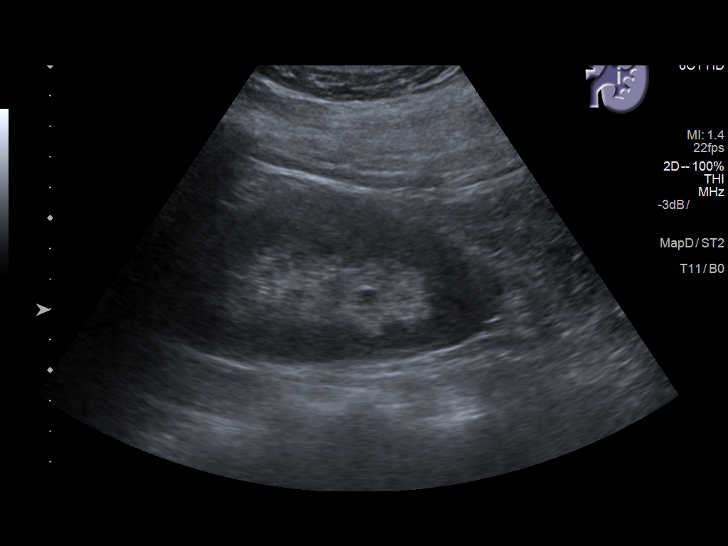
[im 17/45]
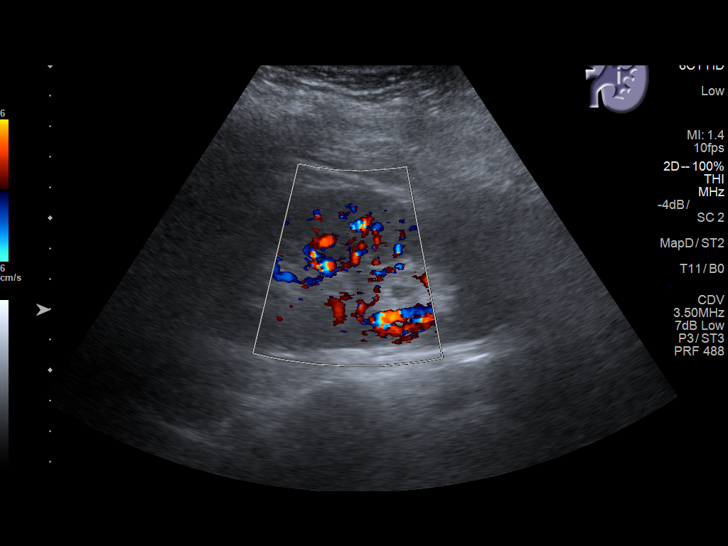
[im 21/45]
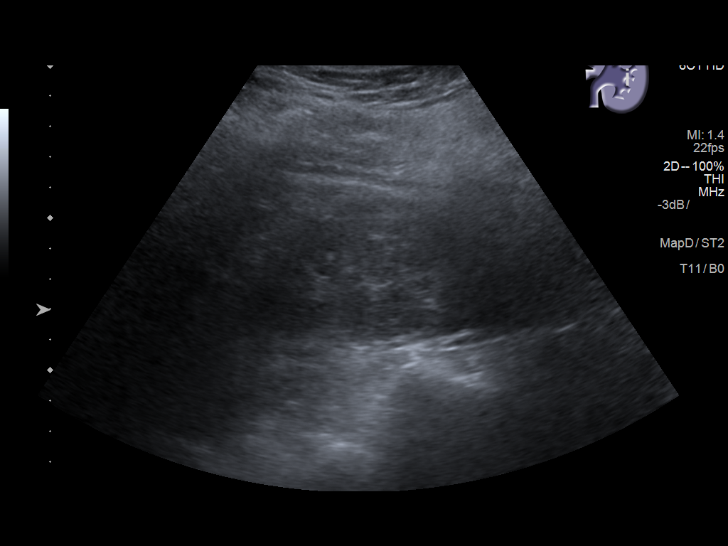
[im 24/45]
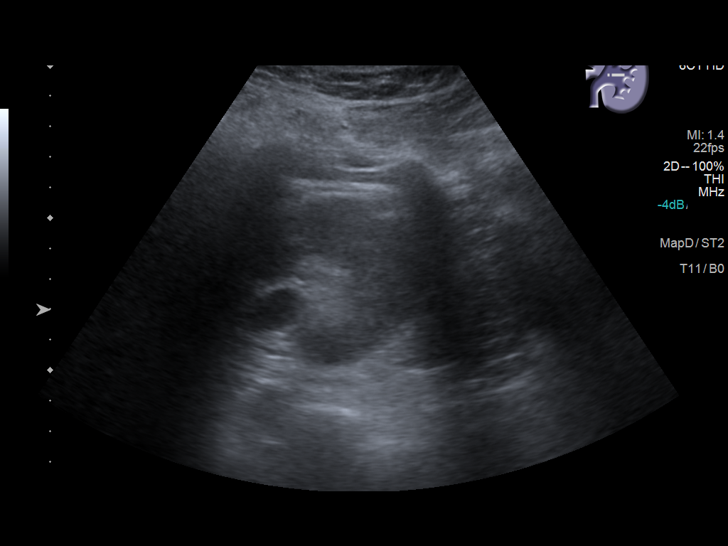
[im 28/45]
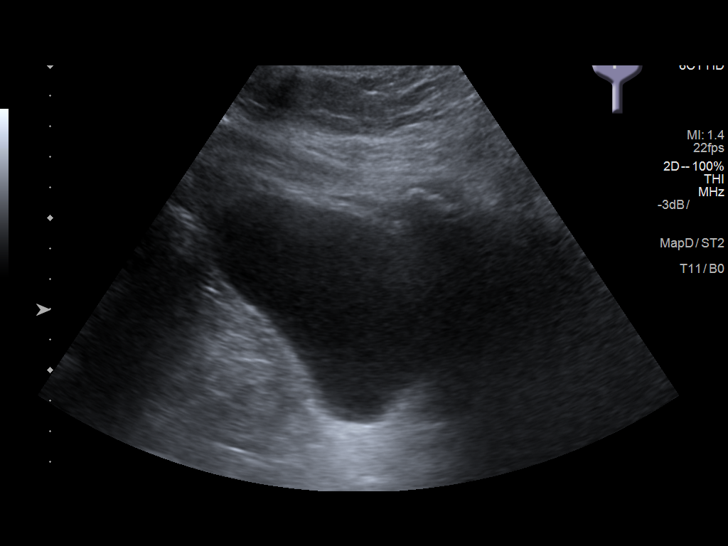
[im 30/45]
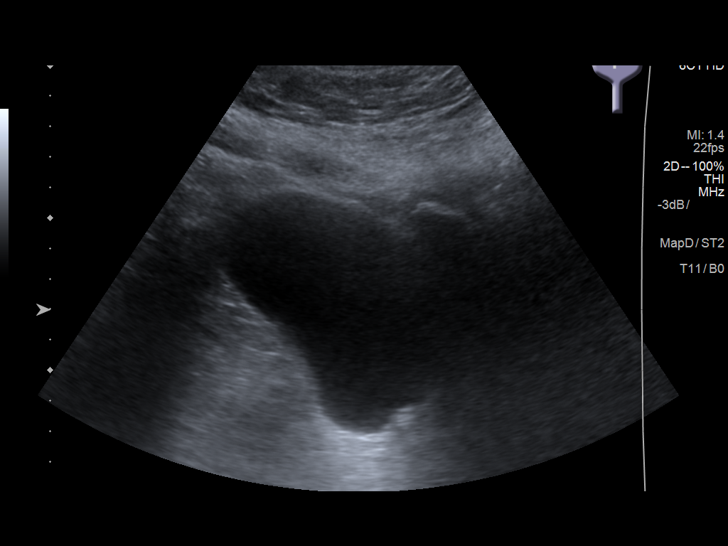
[im 34/45]
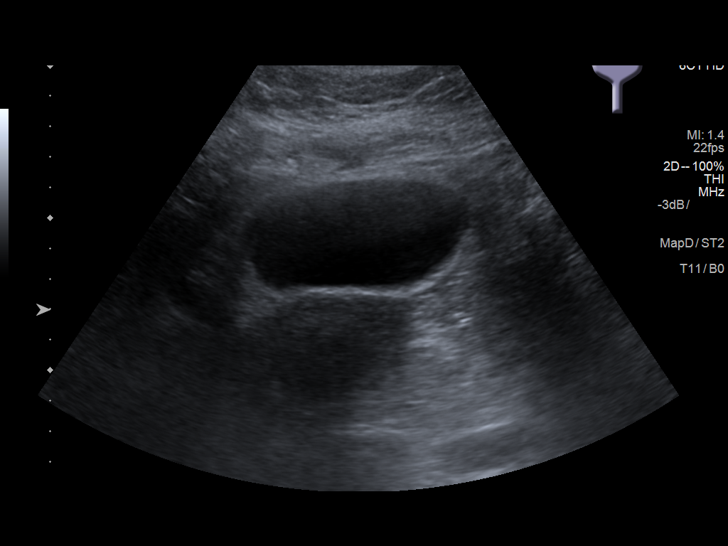
[im 37/45]
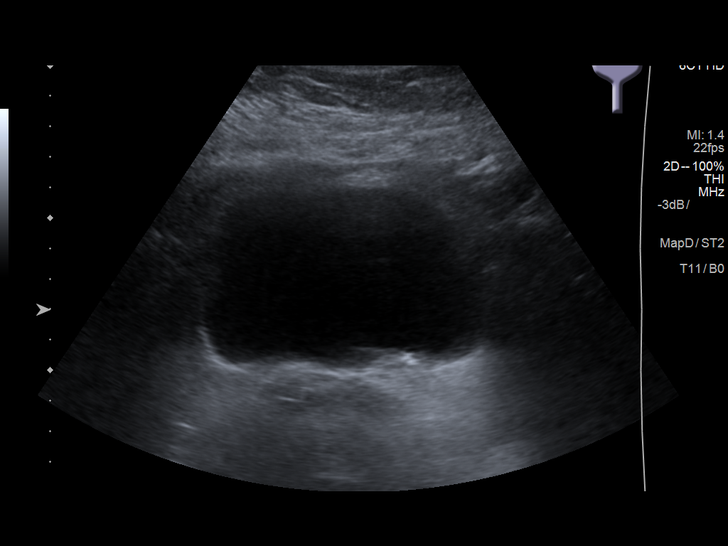
[im 41/45]
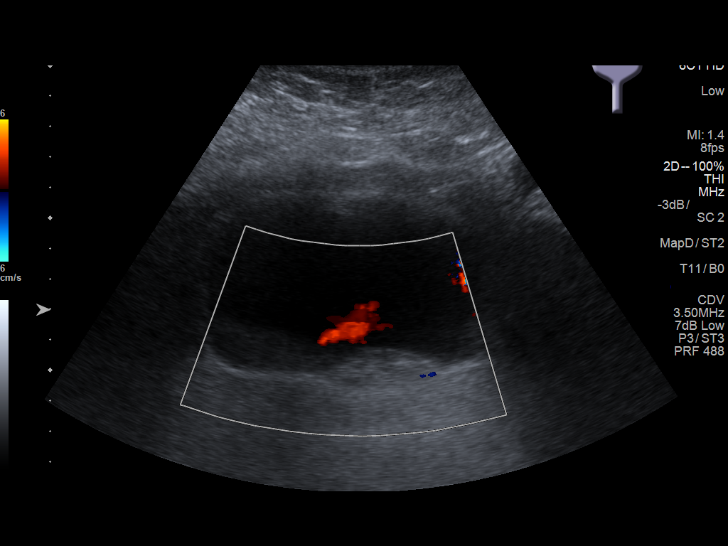
[im 45/45]
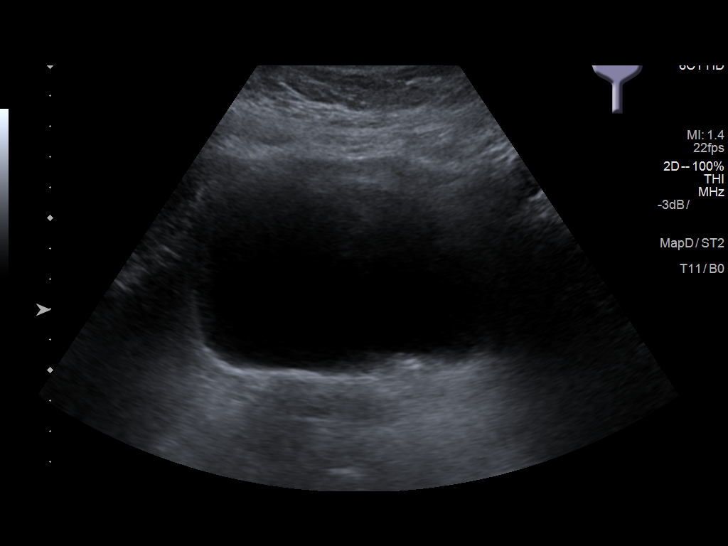

[14 of 25 positions shown; findings below may reference images not displayed]

FINDINGS: Right Kidney:

Length: 9.8 cm. Echogenicity within normal limits. No mass or
hydronephrosis visualized.

Left Kidney:

Length: 9.9 cm. Echogenicity within normal limits. Mild LEFT
hydronephrosis does not appear significantly changed from 05/29/2018
CT. No evidence of solid mass.

Bladder:

Normal LEFT ureteral jet is not identified. A probable LEFT UVJ
calculus identified. Normal RIGHT ureteral jet noted.
IMPRESSION: 1. Mild LEFT hydronephrosis and probable LEFT UVJ calculus noted.
Mild LEFT hydronephrosis does not appear significantly changed from
05/29/2018 CT.
2. Unremarkable RIGHT kidney.

## 2018-11-17 ENCOUNTER — Ambulatory Visit (INDEPENDENT_AMBULATORY_CARE_PROVIDER_SITE_OTHER): Payer: 59 | Admitting: Internal Medicine

## 2018-11-17 ENCOUNTER — Encounter: Payer: Self-pay | Admitting: Internal Medicine

## 2018-11-17 VITALS — BP 136/68 | HR 67 | Temp 98.1°F | Ht 61.0 in | Wt 230.0 lb

## 2018-11-17 DIAGNOSIS — E559 Vitamin D deficiency, unspecified: Secondary | ICD-10-CM

## 2018-11-17 DIAGNOSIS — Z1231 Encounter for screening mammogram for malignant neoplasm of breast: Secondary | ICD-10-CM | POA: Diagnosis not present

## 2018-11-17 DIAGNOSIS — E039 Hypothyroidism, unspecified: Secondary | ICD-10-CM | POA: Diagnosis not present

## 2018-11-17 DIAGNOSIS — R918 Other nonspecific abnormal finding of lung field: Secondary | ICD-10-CM

## 2018-11-17 DIAGNOSIS — K760 Fatty (change of) liver, not elsewhere classified: Secondary | ICD-10-CM | POA: Diagnosis not present

## 2018-11-17 DIAGNOSIS — R739 Hyperglycemia, unspecified: Secondary | ICD-10-CM

## 2018-11-17 DIAGNOSIS — K635 Polyp of colon: Secondary | ICD-10-CM | POA: Insufficient documentation

## 2018-11-17 DIAGNOSIS — F419 Anxiety disorder, unspecified: Secondary | ICD-10-CM

## 2018-11-17 DIAGNOSIS — Z0000 Encounter for general adult medical examination without abnormal findings: Secondary | ICD-10-CM | POA: Diagnosis not present

## 2018-11-17 DIAGNOSIS — N200 Calculus of kidney: Secondary | ICD-10-CM | POA: Diagnosis not present

## 2018-11-17 DIAGNOSIS — Z111 Encounter for screening for respiratory tuberculosis: Secondary | ICD-10-CM | POA: Diagnosis not present

## 2018-11-17 DIAGNOSIS — K8020 Calculus of gallbladder without cholecystitis without obstruction: Secondary | ICD-10-CM

## 2018-11-17 DIAGNOSIS — I10 Essential (primary) hypertension: Secondary | ICD-10-CM | POA: Diagnosis not present

## 2018-11-17 MED ORDER — AMLODIPINE BESYLATE 5 MG PO TABS: 5.0000 mg | ORAL_TABLET | ORAL | 3 refills | Status: DC

## 2018-11-17 MED ORDER — ALPRAZOLAM 0.25 MG PO TABS: 0.1250 mg | ORAL_TABLET | Freq: Every day | ORAL | 5 refills | Status: DC | PRN

## 2018-11-17 NOTE — Progress Notes (Signed)
Chief Complaint  Patient presents with  . Annual Exam   Annual with husband  1. Resolved left kidney stone s/p lithotripsy and Xray 07/18/18 negative stone  2. Anxiety having to take xanax 0.25 1/2 tablet prn stressors at times include work  3. HTN controlled on norvasc 5 mg qd and hyzaar 100-12.5 mg qd  4. Reviewed [place] ab 2014 ? Gallbladder polyp, fatty liver colonoscopy due 05/2019 will have pt f/u with Dr. Allen Norris  5. Lung nodules noted on CT ab/pelvis 05/2018 pt wants to think about CT chest declines for now wants to check with insurance agreeable to ppd    Review of Systems  Constitutional: Negative for weight loss.  HENT: Negative for hearing loss.   Eyes: Negative for blurred vision.  Respiratory: Negative for shortness of breath.   Cardiovascular: Negative for chest pain.  Gastrointestinal: Negative for abdominal pain.  Musculoskeletal: Negative for back pain.  Skin: Negative for rash.  Neurological: Negative for headaches.  Psychiatric/Behavioral: Negative for depression. The patient is nervous/anxious.    Past Medical History:  Diagnosis Date  . Anxiety   . Asthma   . HTN (hypertension)   . Hypothyroidism   . Kidney stone    05/29/18  . Low back pain   . Obesity   . Vitamin D deficiency    Past Surgical History:  Procedure Laterality Date  . APPENDECTOMY    . DILATION AND CURETTAGE OF UTERUS    . EXTRACORPOREAL SHOCK WAVE LITHOTRIPSY Left 06/22/2018   Procedure: EXTRACORPOREAL SHOCK WAVE LITHOTRIPSY (ESWL);  Surgeon: Abbie Sons, MD;  Location: [facility] ORS;  Service: Urology;  Laterality: Left;  . LIPOMA EXCISION     Family History  Problem Relation Age of Onset  . Alzheimer's disease Mother   . Hypertension Mother   . Cancer Mother 54       colon  . Lupus Daughter   . Heart attack Maternal Grandmother   . Breast cancer Other   . Breast cancer Maternal Aunt 32  . Bladder Cancer Neg Hx   . Kidney cancer Neg Hx    Social History   Socioeconomic History  .  Marital status: Married    Spouse name: Not on file  . Number of children: 2  . Years of education: Not on file  . Highest education level: Not on file  Occupational History  . 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 Financial resource strain: Not on file  . Food insecurity:    Worry: Not on file    Inability: Not on file  . Transportation needs:    Medical: Not on file    Non-medical: Not on file  Tobacco Use  . Smoking status: Never Smoker  . Smokeless tobacco: Never Used  Substance and Sexual Activity  . Alcohol use: No    Alcohol/week: 0.0 standard drinks  . Drug use: No  . Sexual activity: Yes    Birth control/protection: Post-menopausal  Lifestyle  . Physical activity:    Days per week: Not on file    Minutes per session: Not on file  . Stress: Not on file  Relationships  . Social connections:    Talks on phone: Not on file    Gets together: Not on file    Attends religious service: Not on file    Active member of club or organization: Not on file    Attends meetings of clubs or organizations: Not  on file    Relationship status: Not on file  . Intimate partner violence:    Fear of current or ex partner: Not on file    Emotionally abused: Not on file    Physically abused: Not on file    Forced sexual activity: Not on file  Other Topics Concern  . Not on file  Social History Narrative   Lives in [place].   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    From [facility]   As of 11/2017 married 36 years    2 kids       Regular Exercise -  Not at this time   Daily Caffeine Use:  1 cup hot tea daily         Current Meds  Medication Sig  . ALPRAZolam (XANAX) 0.25 MG tablet TAKE 1 TABLET BY MOUTH TWICE DAILY AS NEEDED  . amLODipine (NORVASC) 5 MG tablet Take 1 tablet (5 mg total) by mouth every morning.  Marland Kitchen levothyroxine (SYNTHROID, LEVOTHROID) 175 MCG tablet Take 1 tablet (175 mcg total) by mouth daily before breakfast.  .  losartan-hydrochlorothiazide (HYZAAR) 100-12.5 MG tablet Take 1 tablet by mouth daily.   No Known Allergies No results found for this or any previous visit (from the past 2160 hour(s)). Objective  Body mass index is 43.46 kg/m. Wt Readings from Last 3 Encounters:  11/17/18 230 lb (104.3 kg)  07/14/18 224 lb (101.6 kg)  06/22/18 228 lb (103.4 kg)   Temp Readings from Last 3 Encounters:  11/17/18 98.1 F (36.7 C) (Oral)  06/22/18 (!) 97.5 F (36.4 C) (Temporal)  06/15/18 98.4 F (36.9 C) (Oral)   BP Readings from Last 3 Encounters:  11/17/18 136/68  07/14/18 123/85  06/22/18 (!) 176/95   Pulse Readings from Last 3 Encounters:  11/17/18 67  07/14/18 67  06/22/18 (!) 50    Physical Exam  Constitutional: She is oriented to person, place, and time. Vital signs are normal. She appears well-developed and well-nourished. She is cooperative.  HENT:  Head: Normocephalic and atraumatic.  Mouth/Throat: Oropharynx is clear and moist and mucous membranes are normal.  Eyes: Pupils are equal, round, and reactive to light. Conjunctivae are normal.  Cardiovascular: Normal rate, regular rhythm and normal heart sounds.  Pulmonary/Chest: Effort normal and breath sounds normal.  Neurological: She is alert and oriented to person, place, and time. Gait normal.  Skin: Skin is warm, dry and intact.  Psychiatric: She has a normal mood and affect. Her speech is normal and behavior is normal. Judgment and thought content normal. Cognition and memory are normal.  Nursing note and vitals reviewed.   Assessment   1. Annual  2. Resolved kidney stones  3. Anxiety  4. HTN  5. Fatty liver,  h/o ?gallbladder polyp, h/o colon polyps  6. Lung nodules noted on CT ab/pelvis 05/2018  Plan   1.  sch fasting labs   Had flu 07/21/18 Tdap had 12/13/08 will be due 2020  pna 23 had 11/25/17  Hep B immuneconsider hep A vaccine see above shingrix 1/2 doses   mammo neg 12/14/17  Pap neg 11/25/17 neg  HPV colonoscopy had 06/07/14 Dr. Allen Norris polpys hyperplastic and tubularf/u as schin 06/07/2019  rec healthy diet choices and exercise  2. ntd  3. Refilled xanax  4. Cont meds refilled norvasc  5. Disc with Dr. Allen Norris if repeat US abdomen needed will be due colonoscopy 06/07/2019  6. Disc with pt consider CT chest she will think about this and get  back with me  Ppd placed today to r/o TB  Provider: Dr. Olivia Mackie McLean-Scocuzza-Internal Medicine

## 2018-11-17 NOTE — Patient Instructions (Addendum)
Consider CT chest  Call to schedule mammogram  Call back 2 months before end 05/2019 Dr. Allen Norris colonoscopy and f/u ? Fatty liver and gallbladder polyp    Fatty Liver Fatty liver, also called hepatic steatosis or steatohepatitis, is a condition in which too much fat has built up in your liver cells. The liver removes harmful substances from your bloodstream. It produces fluids your body needs. It also helps your body use and store energy from the food you eat. In many cases, fatty liver does not cause symptoms or problems. It is often diagnosed when tests are being done for other reasons. However, over time, fatty liver can cause inflammation that may lead to more serious liver problems, such as scarring of the liver (cirrhosis). What are the causes? Causes of fatty liver may include:  Drinking too much alcohol.  Poor nutrition.  Obesity.  Cushing syndrome.  Diabetes.  Hyperlipidemia.  Pregnancy.  Certain drugs.  Poisons.  Some viral infections.  What increases the risk? You may be more likely to develop fatty liver if you:  Abuse alcohol.  Are pregnant.  Are overweight.  Have diabetes.  Have hepatitis.  Have a high triglyceride level.  What are the signs or symptoms? Fatty liver often does not cause any symptoms. In cases where symptoms develop, they can include:  Fatigue.  Weakness.  Weight loss.  Confusion.  Abdominal pain.  Yellowing of your skin and the white parts of your eyes (jaundice).  Nausea and vomiting.  How is this diagnosed? Fatty liver may be diagnosed by:  Physical exam and medical history.  Blood tests.  Imaging tests, such as an ultrasound, CT scan, or MRI.  Liver biopsy. A small sample of liver tissue is removed using a needle. The sample is then looked at under a microscope.  How is this treated? Fatty liver is often caused by other health conditions. Treatment for fatty liver may involve medicines and lifestyle changes to  manage conditions such as:  Alcoholism.  High cholesterol.  Diabetes.  Being overweight or obese.  Follow these instructions at home:  Eat a healthy diet as directed by your health care provider.  Exercise regularly. This can help you lose weight and control your cholesterol and diabetes. Talk to your health care provider about an exercise plan and which activities are best for you.  Do not drink alcohol.  Take medicines only as directed by your health care provider. Contact a health care provider if: You have difficulty controlling your:  Blood sugar.  Cholesterol.  Alcohol consumption.  Get help right away if:  You have abdominal pain.  You have jaundice.  You have nausea and vomiting. This information is not intended to replace advice given to you by your health care provider. Make sure you discuss any questions you have with your health care provider. Document Released: 01/14/2006 Document Revised: 05/06/2016 Document Reviewed: 04/10/2014 Elsevier Interactive Patient Education  2018 [place].  Nonalcoholic Fatty Liver Disease Diet Nonalcoholic fatty liver disease is a condition that causes fat to accumulate in and around the liver. The disease makes it harder for the liver to work the way that it should. Following a healthy diet can help to keep nonalcoholic fatty liver disease under control. It can also help to prevent or improve conditions that are associated with the disease, such as heart disease, diabetes, high blood pressure, and abnormal cholesterol levels. Along with regular exercise, this diet:  Promotes weight loss.  Helps to control blood sugar levels.  Helps to improve the way that the body uses insulin.  What do I need to know about this diet?  Use the glycemic index (GI) to plan your meals. The index tells you how quickly a food will raise your blood sugar. Choose low-GI foods. These foods take a longer time to raise blood sugar.  Keep track  of how many calories you take in. Eating the right amount of calories will help you to achieve a healthy weight.  You may want to follow a Mediterranean diet. This diet includes a lot of vegetables, lean meats or fish, whole grains, fruits, and healthy oils and fats. What foods can I eat? Grains Whole grains, such as whole-wheat or whole-grain breads, crackers, tortillas, cereals, and pasta. Stone-ground whole wheat. Pumpernickel bread. Unsweetened oatmeal. Bulgur. Barley. Quinoa. Brown or wild rice. Corn or whole-wheat flour tortillas. Vegetables Lettuce. Spinach. Peas. Beets. Cauliflower. Cabbage. Broccoli. Carrots. Tomatoes. Squash. Eggplant. Herbs. Peppers. Onions. Cucumbers. Brussels sprouts. Yams and sweet potatoes. Beans. Lentils. Fruits Bananas. Apples. Oranges. Grapes. Papaya. Mango. Pomegranate. Kiwi. Grapefruit. Cherries. Meats and Other Protein Sources Seafood and shellfish. Lean meats. Poultry. Tofu. Dairy Low-fat or fat-free dairy products, such as yogurt, cottage cheese, and cheese. Beverages Water. Sugar-free drinks. Tea. Coffee. Low-fat or skim milk. Milk alternatives, such as soy or almond milk. Real fruit juice. Condiments Mustard. Relish. Low-fat, low-sugar ketchup and barbecue sauce. Low-fat or fat-free mayonnaise. Sweets and Desserts Sugar-free sweets. Fats and Oils Avocado. Canola or olive oil. Nuts and nut butters. Seeds. The items listed above may not be a complete list of recommended foods or beverages. Contact your dietitian for more options. What foods are not recommended? Palm oil and coconut oil. Processed foods. Fried foods. Sweetened drinks, such as sweet tea, milkshakes, snow cones, iced sweet drinks, and sodas. Alcohol. Sweets. Foods that contain a lot of salt or sodium. The items listed above may not be a complete list of foods and beverages to avoid. Contact your dietitian for more information. This information is not intended to replace advice given to  you by your health care provider. Make sure you discuss any questions you have with your health care provider. Document Released: 04/15/2015 Document Revised: 05/06/2016 Document Reviewed: 12/24/2014 Elsevier Interactive Patient Education  2018 [employer].   Pulmonary Nodule A pulmonary nodule is a small, round growth of tissue in the lung. Pulmonary nodules can range in size from less than 1/5 inch (4 mm) to a little bigger than an inch (25 mm). Most pulmonary nodules are detected when imaging tests of the lung are being performed for a different problem. Pulmonary nodules are usually not cancerous (benign). However, some pulmonary nodules are cancerous (malignant). Follow-up treatment or testing is based on the size of the pulmonary nodule and your risk of getting lung cancer. What are the causes? Benign pulmonary nodules can be caused by various things. Some of the causes include:  Bacterial, fungal, or viral infections. This is usually an old infection that is no longer active, but it can sometimes be a current, active infection.  A benign mass of tissue.  Inflammation from conditions such as rheumatoid arthritis.  Abnormal blood vessels in the lungs.  Malignant pulmonary nodules can result from lung cancer or from cancers that spread to the lung from other places in the body. What are the signs or symptoms? Pulmonary nodules usually do not cause symptoms. How is this diagnosed? Most often, pulmonary nodules are found incidentally when an X-ray or CT scan is performed to  look for some other problem in the lung area. To help determine whether a pulmonary nodule is benign or malignant, your health care provider will take a medical history and order a variety of tests. Tests done may include:  Blood tests.  A skin test called a tuberculin test. This test is used to determine if you have been exposed to the germ that causes tuberculosis.  Chest X-rays. If possible, a new X-ray may be  compared with X-rays you have had in the past.  CT scan. This test shows smaller pulmonary nodules more clearly than an X-ray.  Positron emission tomography (PET) scan. In this test, a safe amount of a radioactive substance is injected into the bloodstream. Then, the scan takes a picture of the pulmonary nodule. The radioactive substance is eliminated from your body in your urine.  Biopsy. A tiny piece of the pulmonary nodule is removed so it can be checked under a microscope.  How is this treated? Pulmonary nodules that are benign normally do not require any treatment because they usually do not cause symptoms or breathing problems. Your health care provider may want to monitor the pulmonary nodule through follow-up CT scans. The frequency of these CT scans will vary based on the size of the nodule and the risk factors for lung cancer. For example, CT scans will need to be done more frequently if the pulmonary nodule is larger and if you have a history of smoking and a family history of cancer. Further testing or biopsies may be done if any follow-up CT scan shows that the size of the pulmonary nodule has increased. Follow these instructions at home:  Only take over-the-counter or prescription medicines as directed by your health care provider.  Keep all follow-up appointments with your health care provider. Contact a health care provider if:  You have trouble breathing when you are active.  You feel sick or unusually tired.  You do not feel like eating.  You lose weight without trying to.  You develop chills or night sweats. Get help right away if:  You cannot catch your breath, or you begin wheezing.  You cannot stop coughing.  You cough up blood.  You become dizzy or feel like you are going to pass out.  You have sudden chest pain.  You have a fever or persistent symptoms for more than 2-3 days.  You have a fever and your symptoms suddenly get worse. This information is  not intended to replace advice given to you by your health care provider. Make sure you discuss any questions you have with your health care provider. Document Released: 09/26/2009 Document Revised: 05/06/2016 Document Reviewed: 05/21/2013 Elsevier Interactive Patient Education  2017 [employer].

## 2018-11-17 NOTE — Progress Notes (Signed)
Pre visit review using our clinic review tool, if applicable. No additional management support is needed unless otherwise documented below in the visit note. 

## 2018-11-20 ENCOUNTER — Ambulatory Visit: Payer: 59 | Admitting: *Deleted

## 2018-11-20 DIAGNOSIS — Z1159 Encounter for screening for other viral diseases: Secondary | ICD-10-CM

## 2018-11-20 LAB — TB SKIN TEST
Induration: 0 mm
TB Skin Test: NEGATIVE

## 2018-11-20 NOTE — Progress Notes (Signed)
Patient has TB screening read.

## 2018-12-01 ENCOUNTER — Other Ambulatory Visit (INDEPENDENT_AMBULATORY_CARE_PROVIDER_SITE_OTHER): Payer: 59

## 2018-12-01 DIAGNOSIS — E559 Vitamin D deficiency, unspecified: Secondary | ICD-10-CM | POA: Diagnosis not present

## 2018-12-01 DIAGNOSIS — K760 Fatty (change of) liver, not elsewhere classified: Secondary | ICD-10-CM | POA: Diagnosis not present

## 2018-12-01 DIAGNOSIS — I10 Essential (primary) hypertension: Secondary | ICD-10-CM | POA: Diagnosis not present

## 2018-12-01 DIAGNOSIS — R739 Hyperglycemia, unspecified: Secondary | ICD-10-CM | POA: Diagnosis not present

## 2018-12-01 DIAGNOSIS — E039 Hypothyroidism, unspecified: Secondary | ICD-10-CM

## 2018-12-01 DIAGNOSIS — Z0184 Encounter for antibody response examination: Secondary | ICD-10-CM | POA: Diagnosis not present

## 2018-12-01 DIAGNOSIS — Z1159 Encounter for screening for other viral diseases: Secondary | ICD-10-CM | POA: Diagnosis not present

## 2018-12-01 LAB — CBC WITH DIFFERENTIAL/PLATELET
Basophils Absolute: 0.1 10*3/uL (ref 0.0–0.1)
Basophils Relative: 1.2 % (ref 0.0–3.0)
Eosinophils Absolute: 0.1 10*3/uL (ref 0.0–0.7)
Eosinophils Relative: 1.4 % (ref 0.0–5.0)
HCT: 43.1 % (ref 36.0–46.0)
Hemoglobin: 14.5 g/dL (ref 12.0–15.0)
Lymphocytes Relative: 31.3 % (ref 12.0–46.0)
Lymphs Abs: 1.9 10*3/uL (ref 0.7–4.0)
MCHC: 33.5 g/dL (ref 30.0–36.0)
MCV: 92.5 fl (ref 78.0–100.0)
Monocytes Absolute: 0.3 10*3/uL (ref 0.1–1.0)
Monocytes Relative: 5.2 % (ref 3.0–12.0)
Neutro Abs: 3.7 10*3/uL (ref 1.4–7.7)
Neutrophils Relative %: 60.9 % (ref 43.0–77.0)
Platelets: 238 10*3/uL (ref 150.0–400.0)
RBC: 4.67 Mil/uL (ref 3.87–5.11)
RDW: 14.2 % (ref 11.5–15.5)
WBC: 6.1 10*3/uL (ref 4.0–10.5)

## 2018-12-01 LAB — COMPREHENSIVE METABOLIC PANEL
ALT: 22 U/L (ref 0–35)
AST: 19 U/L (ref 0–37)
Albumin: 3.9 g/dL (ref 3.5–5.2)
Alkaline Phosphatase: 74 U/L (ref 39–117)
BUN: 19 mg/dL (ref 6–23)
CHLORIDE: 103 meq/L (ref 96–112)
CO2: 30 mEq/L (ref 19–32)
Calcium: 9.4 mg/dL (ref 8.4–10.5)
Creatinine, Ser: 0.78 mg/dL (ref 0.40–1.20)
GFR: 79.72 mL/min (ref >60.00)
Glucose, Bld: 102 mg/dL — ABNORMAL HIGH (ref 70–99)
POTASSIUM: 3.9 meq/L (ref 3.5–5.1)
Sodium: 140 mEq/L (ref 135–145)
Total Bilirubin: 1.5 mg/dL — ABNORMAL HIGH (ref 0.2–1.2)
Total Protein: 7.2 g/dL (ref 6.0–8.3)

## 2018-12-01 LAB — VITAMIN D 25 HYDROXY (VIT D DEFICIENCY, FRACTURES): VITD: 26.16 ng/mL — AB (ref 30.00–100.00)

## 2018-12-01 LAB — LIPID PANEL
Cholesterol: 175 mg/dL (ref 0–200)
HDL: 53.9 mg/dL (ref >39.00)
LDL Cholesterol: 103 mg/dL — ABNORMAL HIGH (ref 0–99)
NonHDL: 121.38
Total CHOL/HDL Ratio: 3
Triglycerides: 94 mg/dL (ref 0.0–149.0)
VLDL: 18.8 mg/dL (ref 0.0–40.0)

## 2018-12-01 LAB — HEMOGLOBIN A1C: HEMOGLOBIN A1C: 5.5 % (ref 4.6–6.5)

## 2018-12-01 LAB — TSH: TSH: 1.87 u[IU]/mL (ref 0.35–4.50)

## 2018-12-04 ENCOUNTER — Other Ambulatory Visit: Payer: Self-pay | Admitting: Internal Medicine

## 2018-12-04 ENCOUNTER — Ambulatory Visit: Payer: Self-pay | Admitting: Internal Medicine

## 2018-12-04 DIAGNOSIS — Z0184 Encounter for antibody response examination: Secondary | ICD-10-CM

## 2018-12-04 DIAGNOSIS — R911 Solitary pulmonary nodule: Secondary | ICD-10-CM

## 2018-12-04 DIAGNOSIS — R918 Other nonspecific abnormal finding of lung field: Secondary | ICD-10-CM

## 2018-12-04 DIAGNOSIS — Z1159 Encounter for screening for other viral diseases: Secondary | ICD-10-CM

## 2018-12-05 ENCOUNTER — Telehealth: Payer: Self-pay

## 2018-12-05 LAB — TEST AUTHORIZATION

## 2018-12-05 LAB — MEASLES/MUMPS/RUBELLA IMMUNITY
Mumps IgG: 76.1 AU/mL
Rubella: 20.1 index
Rubeola IgG: >300 AU/mL

## 2018-12-05 LAB — HEPATITIS A ANTIBODY, TOTAL: HEPATITIS A AB,TOTAL: NONREACTIVE

## 2018-12-05 NOTE — Telephone Encounter (Signed)
Copied from [place] [id number]. Topic: Referral - Question >> Dec 05, 2018  8:57 AM Reyne Dumas L wrote: Reason for CRM:   Pt states she is returning a call to [facility] regarding a CT.  States she was scheduled for CT today, then it was changed to Friday.  Pt states that she just remembered that her husband is having surgery on Friday and she wants to know if the CT can be rescheduled to Thursday. Pt can be reached at [phone]

## 2018-12-07 ENCOUNTER — Telehealth: Payer: Self-pay

## 2018-12-07 NOTE — Telephone Encounter (Signed)
Unsure who to send this too. We do not have a chris here at the practice Copied from [place] [id number]. Topic: General - Other >> Dec 07, 2018 11:46 AM Yvette Rack wrote: Reason for CRM: pt  is calling to speak with Gerald Stabs about a CT scan

## 2018-12-08 ENCOUNTER — Ambulatory Visit: Payer: Self-pay | Admitting: Internal Medicine

## 2018-12-08 ENCOUNTER — Ambulatory Visit: Payer: 59

## 2019-01-01 ENCOUNTER — Encounter: Payer: Self-pay | Admitting: Internal Medicine

## 2019-01-23 ENCOUNTER — Telehealth: Payer: Self-pay

## 2019-01-23 NOTE — Telephone Encounter (Signed)
Copied from [place] [id number]. Topic: Referral - Question >> Jan 23, 2019  4:19 PM Reyne Dumas L wrote: Reason for CRM:  Pt states she thought she was supposed to have bone density scan the same day she had her mammogram.  However, [facility] only has an order for a mammogram.  Pt would like to know if she is supposed to be having both. Pt can be reached at [phone]

## 2019-01-23 NOTE — Telephone Encounter (Signed)
I wanted her to have CT of her chest for lung nodules  Does she want me to schedule this?  Bone density is typically every 3-5 years with h/o thin bones or more frequently with osteoporosis history -does she have either?   Bone density typical age is 47 for screening unless she h/o fracture   How would she like to proceed ?   [facility]

## 2019-01-26 ENCOUNTER — Other Ambulatory Visit: Payer: Self-pay | Admitting: Internal Medicine

## 2019-01-26 DIAGNOSIS — E2839 Other primary ovarian failure: Secondary | ICD-10-CM

## 2019-01-26 NOTE — Telephone Encounter (Signed)
Pt will need to call to schedule mammogram and dexa orders are in   [place]

## 2019-01-26 NOTE — Telephone Encounter (Signed)
Patient want to wait and discuss at follow up about the ct.  As for the bone density she is ok with that order that and mammo.

## 2019-03-29 ENCOUNTER — Other Ambulatory Visit: Payer: Self-pay

## 2019-04-20 ENCOUNTER — Ambulatory Visit: Payer: Self-pay | Admitting: Internal Medicine

## 2019-05-30 ENCOUNTER — Other Ambulatory Visit: Payer: Self-pay

## 2019-05-30 ENCOUNTER — Ambulatory Visit
Admission: RE | Admit: 2019-05-30 | Discharge: 2019-05-30 | Disposition: A | Discharge status: Home / Self Care | Payer: No Typology Code available for payment source | Source: Ambulatory Visit | Attending: Internal Medicine | Admitting: Internal Medicine

## 2019-05-30 DIAGNOSIS — Z1231 Encounter for screening mammogram for malignant neoplasm of breast: Secondary | ICD-10-CM | POA: Diagnosis present

## 2019-05-30 DIAGNOSIS — E2839 Other primary ovarian failure: Secondary | ICD-10-CM | POA: Insufficient documentation

## 2019-06-05 ENCOUNTER — Other Ambulatory Visit: Payer: Self-pay | Admitting: Internal Medicine

## 2019-06-05 ENCOUNTER — Encounter: Payer: Self-pay | Admitting: Internal Medicine

## 2019-06-05 DIAGNOSIS — I10 Essential (primary) hypertension: Secondary | ICD-10-CM

## 2019-06-05 DIAGNOSIS — E039 Hypothyroidism, unspecified: Secondary | ICD-10-CM

## 2019-06-05 MED ORDER — LEVOTHYROXINE SODIUM 175 MCG PO TABS: 175.0000 ug | ORAL_TABLET | Freq: Every day | ORAL | 3 refills | Status: DC

## 2019-06-05 MED ORDER — LOSARTAN POTASSIUM-HCTZ 100-12.5 MG PO TABS: 1.0000 | ORAL_TABLET | Freq: Every day | ORAL | 3 refills | Status: DC

## 2019-06-26 ENCOUNTER — Other Ambulatory Visit: Payer: Self-pay | Admitting: Internal Medicine

## 2019-06-26 DIAGNOSIS — F419 Anxiety disorder, unspecified: Secondary | ICD-10-CM

## 2019-06-26 MED ORDER — ALPRAZOLAM 0.25 MG PO TABS: 0.1250 mg | ORAL_TABLET | Freq: Every day | ORAL | 5 refills | Status: DC | PRN

## 2019-11-23 ENCOUNTER — Encounter: Payer: Self-pay | Admitting: Internal Medicine

## 2019-11-23 ENCOUNTER — Ambulatory Visit (INDEPENDENT_AMBULATORY_CARE_PROVIDER_SITE_OTHER): Payer: No Typology Code available for payment source | Admitting: Internal Medicine

## 2019-11-23 VITALS — Ht 61.0 in | Wt 235.0 lb

## 2019-11-23 DIAGNOSIS — I10 Essential (primary) hypertension: Secondary | ICD-10-CM | POA: Diagnosis not present

## 2019-11-23 DIAGNOSIS — R739 Hyperglycemia, unspecified: Secondary | ICD-10-CM

## 2019-11-23 DIAGNOSIS — L989 Disorder of the skin and subcutaneous tissue, unspecified: Secondary | ICD-10-CM

## 2019-11-23 DIAGNOSIS — R0982 Postnasal drip: Secondary | ICD-10-CM

## 2019-11-23 DIAGNOSIS — Z1231 Encounter for screening mammogram for malignant neoplasm of breast: Secondary | ICD-10-CM

## 2019-11-23 DIAGNOSIS — Z1329 Encounter for screening for other suspected endocrine disorder: Secondary | ICD-10-CM

## 2019-11-23 DIAGNOSIS — Z0000 Encounter for general adult medical examination without abnormal findings: Secondary | ICD-10-CM | POA: Diagnosis not present

## 2019-11-23 DIAGNOSIS — J4520 Mild intermittent asthma, uncomplicated: Secondary | ICD-10-CM

## 2019-11-23 DIAGNOSIS — F419 Anxiety disorder, unspecified: Secondary | ICD-10-CM | POA: Diagnosis not present

## 2019-11-23 DIAGNOSIS — J309 Allergic rhinitis, unspecified: Secondary | ICD-10-CM

## 2019-11-23 DIAGNOSIS — E039 Hypothyroidism, unspecified: Secondary | ICD-10-CM

## 2019-11-23 DIAGNOSIS — E559 Vitamin D deficiency, unspecified: Secondary | ICD-10-CM

## 2019-11-23 DIAGNOSIS — Z1389 Encounter for screening for other disorder: Secondary | ICD-10-CM

## 2019-11-23 DIAGNOSIS — R17 Unspecified jaundice: Secondary | ICD-10-CM

## 2019-11-23 MED ORDER — BUDESONIDE-FORMOTEROL FUMARATE 80-4.5 MCG/ACT IN AERO: 2.0000 | INHALATION_SPRAY | Freq: Two times a day (BID) | RESPIRATORY_TRACT | 11 refills | Status: DC

## 2019-11-23 MED ORDER — SALINE SPRAY 0.65 % NA SOLN: 1.0000 | NASAL | 12 refills | Status: DC | PRN

## 2019-11-23 MED ORDER — AMLODIPINE BESYLATE 5 MG PO TABS: 5.0000 mg | ORAL_TABLET | ORAL | 3 refills | Status: DC

## 2019-11-23 MED ORDER — IPRATROPIUM BROMIDE 0.06 % NA SOLN: 2.0000 | Freq: Three times a day (TID) | NASAL | 12 refills | Status: DC

## 2019-11-23 MED ORDER — ALPRAZOLAM 0.25 MG PO TABS: 0.1250 mg | ORAL_TABLET | Freq: Every day | ORAL | 5 refills | Status: DC | PRN

## 2019-11-23 MED ORDER — ALBUTEROL SULFATE HFA 108 (90 BASE) MCG/ACT IN AERS: 1.0000 | INHALATION_SPRAY | Freq: Four times a day (QID) | RESPIRATORY_TRACT | 11 refills | Status: DC | PRN

## 2019-11-23 MED ORDER — MONTELUKAST SODIUM 10 MG PO TABS: 10.0000 mg | ORAL_TABLET | Freq: Every day | ORAL | 3 refills | Status: DC

## 2019-11-23 NOTE — Progress Notes (Signed)
Virtual Visit via Video Note I connected wit Barbara Blake  on 11/23/19 at  9:00 AM EST by a video enabled telemedicine application and verified that I am speaking with the correct person using two identifiers.  Location patient: home Location provider:work or home office Persons participating in the virtual visit: patient, provider, pts husband   I discussed the limitations of evaluation and management by telemedicine and the availability of in person appointments. The patient expressed understanding and agreed to proceed.   HPI: 1. Annual  2. Allergies to pollen, dust, allergies and c/o cough intermittently on albuterol inhaler. She is taking otc allergy pill  C/o post nasal drip causing cough as well  3. HTN BP 132/76 on norvasc 5 mg qd, hyzaar-hct 100-12.5 mg qd  She is exercising on treadmill 20-25 min.  4. Hypothyroidism sythyroidism 175 mcg   ROS: See pertinent positives and negatives per HPI. General: no wt change  HEENT: normal hearing CV: denies chest pain  Lungs: denies sob  GI: no ab pain or blood in stool  GU: h/o kidney stones no current issues  MSK: no jt pain  Skin: new brown raised lesion to right jawline/cheek on side of face where sun hits her face Psych: anxiety controlled  Neuro: no h/a   Past Medical History:  Diagnosis Date  . Anxiety   . Asthma   . HTN (hypertension)   . Hypothyroidism   . Kidney stone    05/29/18  . Low back pain   . Lung nodule   . Obesity   . Vitamin D deficiency     Past Surgical History:  Procedure Laterality Date  . APPENDECTOMY    . DILATION AND CURETTAGE OF UTERUS    . EXTRACORPOREAL SHOCK WAVE LITHOTRIPSY Left 06/22/2018   Procedure: EXTRACORPOREAL SHOCK WAVE LITHOTRIPSY (ESWL);  Surgeon: Abbie Sons, MD;  Location: [facility] ORS;  Service: Urology;  Laterality: Left;  . LIPOMA EXCISION      Family History  Problem Relation Age of Onset  . Alzheimer's disease Mother   . Hypertension Mother   . Cancer Mother 55       colon  . Lupus Daughter   . Heart attack Maternal Grandmother   . Breast cancer Other   . Breast cancer Maternal Aunt 85  . Bladder Cancer Neg Hx   . Kidney cancer Neg Hx     SOCIAL HX:  Lives in [place]. 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  From [facility] As of 11/2019 married 2 years  2 kids   Regular Exercise -  Not at this time Daily Caffeine Use:  1 cup hot tea daily   Current Outpatient Medications:  .  ALPRAZolam (XANAX) 0.25 MG tablet, Take 0.5 tablets (0.125 mg total) by mouth daily as needed., Disp: 30 tablet, Rfl: 5 .  amLODipine (NORVASC) 5 MG tablet, Take 1 tablet (5 mg total) by mouth every morning., Disp: 90 tablet, Rfl: 3 .  levothyroxine (SYNTHROID) 175 MCG tablet, Take 1 tablet (175 mcg total) by mouth daily before breakfast., Disp: 90 tablet, Rfl: 3 .  losartan-hydrochlorothiazide (HYZAAR) 100-12.5 MG tablet, Take 1 tablet by mouth daily., Disp: 90 tablet, Rfl: 3 .  albuterol (PROAIR HFA) 108 (90 Base) MCG/ACT inhaler, Inhale 1-2 puffs into the lungs every 6 (six) hours as needed for wheezing or shortness of breath., Disp: 18 g, Rfl: 11 .  budesonide-formoterol (SYMBICORT) 80-4.5 MCG/ACT inhaler, Inhale 2 puffs into the lungs 2 (two) times daily. Rinse mouth, Disp: 1 Inhaler,  Rfl: 11 .  ipratropium (ATROVENT) 0.06 % nasal spray, Place 2 sprays into both nostrils 3 (three) times daily., Disp: 15 mL, Rfl: 12 .  montelukast (SINGULAIR) 10 MG tablet, Take 1 tablet (10 mg total) by mouth at bedtime., Disp: 90 tablet, Rfl: 3 .  sodium chloride (OCEAN) 0.65 % SOLN nasal spray, Place 1 spray into both nostrils as needed for congestion. Before atrovent, Disp: 30 mL, Rfl: 12  EXAM:  VITALS per patient if applicable:  GENERAL: alert, oriented, appears well and in no acute distress  HEENT: atraumatic, conjunttiva clear, no obvious abnormalities on inspection of external nose and ears  NECK: normal movements of the head and neck  LUNGS: on inspection no signs of  respiratory distress, breathing rate appears normal, no obvious gross SOB, gasping or wheezing  CV: no obvious cyanosis  MS: moves all visible extremities without noticeable abnormality  PSYCH/NEURO: pleasant and cooperative, no obvious depression or anxiety, speech and thought processing grossly intact  ASSESSMENT AND PLAN:  Discussed the following assessment and plan:  Annual physical exam sch fasting labs 12/2019   Had flu utd Tdap had 12/13/08 will be due 2020 will need to sch with labs Tdap  pna 23 had 11/25/17  Hep B immuneconsider hep A vaccine h/o fatty liver shingrix 2/2 doses  MMR immune Hep C negative   mammo neg 05/30/19 negative referred another mammo Pap neg 11/25/17 neg HPV pap due in 2021 colonoscopy had 06/07/14 Dr. Allen Norris polpys hyperplastic and tubular -pt wants to hold as of 11/23/2019 and will call Dr. Allen Norris when ready it is currently due  rec healthy diet choices and exercise currently doing 20-25 min on treadmill  CT chest recommended to f/u CT ab/pelvis 05/2018 noted lung nodules  -as of 11/23/19 pt to call back with facility she wants to go to [facility] costs to much  -consider dermatology in future hold for now as of 11/23/19 will let me know when wants referral    Essential hypertension - Plan: amLODipine (NORVASC) 5 MG tablet Cont other meds  Monitor BP   Anxiety - Plan: ALPRAZolam (XANAX) 0.25 MG tablet qd prn   Allergic rhinitis, unspecified seasonality, unspecified trigger - Plan: albuterol (PROAIR HFA) 108 (90 Base) MCG/ACT inhaler, ipratropium (ATROVENT) 0.06 % nasal spray, montelukast (SINGULAIR) 10 MG tablet, budesonide-formoterol (SYMBICORT) 80-4.5 MCG/ACT inhaler 2 puff bid rinse mouth, sodium chloride (OCEAN) 0.65 % SOLN nasal spray  Mild intermittent asthma, unspecified whether complicated - Plan: albuterol (PROAIR HFA) 108 (90 Base) MCG/ACT inhaler, ipratropium (ATROVENT) 0.06 % nasal spray, montelukast (SINGULAIR) 10 MG tablet,  budesonide-formoterol (SYMBICORT) 80-4.5 MCG/ACT inhaler, sodium chloride (OCEAN) 0.65 % SOLN nasal spray  PND (post-nasal drip) - Plan: ipratropium (ATROVENT) 0.06 % nasal spray, montelukast (SINGULAIR) 10 MG tablet   Skin lesion of face likely SK right lower jaw/cheek  -we discussed possible serious and likely etiologies, options for evaluation and workup, limitations of telemedicine visit vs in person visit, treatment, treatment risks and precautions. Pt prefers to treat via telemedicine empirically rather then risking or undertaking an in person visit at this moment. Patient agrees to seek prompt in person care if worsening, new symptoms arise, or if is not improving with treatment.   I discussed the assessment and treatment plan with the patient. The patient was provided an opportunity to ask questions and all were answered. The patient agreed with the plan and demonstrated an understanding of the instructions.   The patient was advised to call back or seek an  in-person evaluation if the symptoms worsen or if the condition fails to improve as anticipated.  Time spent 20 minutes  Delorise Jackson, MD

## 2019-11-23 NOTE — Patient Instructions (Addendum)
Please do not forget about CT chest   Pulmonary Nodule A pulmonary nodule is a small, round growth of tissue in the lung. It is sometimes referred to as a "shadow" or "spot on the lung." Nodules range in size from less than 1/5 of an inch (4 mm) to a little bigger than an inch (30 mm). Pulmonary nodules can be either noncancerous (benign) or cancerous (malignant). Most are noncancerous. Smaller nodules in people who do not smoke and do not have any other risk factors for lung cancer are more likely to be noncancerous. Larger, irregular nodules in people who smoke or who have a strong family history of lung cancer are more likely to be cancerous. What are the causes? This condition may be caused by:  A bacterial, fungal, or viral infection, such as tuberculosis. The infection is usually an old and inactive one.  A noncancerous mass of tissue.  Inflammation from conditions such as rheumatoid arthritis.  Abnormal blood vessels in the lungs.  Cancerous tissue, such as lung cancer or a cancer in another part of the body that has spread to the lung. What are the signs or symptoms? This condition usually does not cause symptoms. If symptoms appear, they are usually related to the underlying cause. For example, if the condition is caused by an infection, you may have a cough or fever. How is this diagnosed? This condition is usually diagnosed with an X-ray or CT scan. To help determine whether a pulmonary nodule is benign or malignant, your health care provider will:  Take your medical history.  Perform a physical exam.  Order tests, including: ? Blood tests. ? A skin test called a tuberculin test. This test is done to check if you have been exposed to the germ that causes tuberculosis. ? Chest X-rays. ? A CT scan. This test shows smaller pulmonary nodules more clearly and with more detail than an X-ray. ? A positron emission tomography (PET) scan. This test is done to check if the nodule is  cancerous. During the test, a safe amount of a radioactive substance is injected into the bloodstream. Then a picture is taken. ? Biopsy. In this test, a tiny piece of the pulmonary nodule is removed and then examined under a microscope. How is this treated? Treatment for this condition depends on whether the pulmonary nodule is malignant or benign as well as your risk of getting cancer.  Noncancerous nodules usually do not need to be treated, but they may need to be monitored with CT scans. If a CT scan shows that the pulmonary nodule got bigger, more tests may be done.  Some nodules need to be removed. If this is the case, you may have a procedure called a thoractomy. During the procedure, your health care provider will make an incision in your chest and remove the part of the lung where the nodule is located. Follow these instructions at home:   Take over-the-counter and prescription medicines only as told by your health care provider.  Do not use any products that contain nicotine or tobacco, such as cigarettes and e-cigarettes. If you need help quitting, ask your health care provider.  Keep all follow-up visits as told by your health care provider. This is important. Contact a health care provider if:  You have trouble breathing when you are active.  You feel sick or unusually tired.  You do not feel like eating.  You lose weight without trying.  You develop chills or night sweats. Get help  right away if:  You cannot catch your breath.  You begin wheezing.  You cannot stop coughing.  You cough up blood.  You become dizzy or feel like you are going to faint.  You have sudden chest pain.  You have a fever or persistent symptoms for more than 2-3 days.  You have a fever and your symptoms suddenly get worse. Summary  A pulmonary nodule is a small, round growth of tissue in the lung. Most pulmonary nodules are noncancerous.  This condition is usually diagnosed with an  X-ray or CT scan.  Common causes of pulmonary nodules include infection, inflammation, and noncancerous growths.  Though less common, if a nodule is found to be cancerous, you will need specific diagnostic tests and treatment options as directed by your medical provider.  Treatment for this condition depends on whether the pulmonary nodule is benign or malignant as well as your risk of getting cancer. This information is not intended to replace advice given to you by your health care provider. Make sure you discuss any questions you have with your health care provider. Document Released: 09/26/2009 Document Revised: 12/23/2017 Document Reviewed: 12/28/2016 Elsevier Patient Education  2020 [place].  Preventing Type 2 Diabetes Mellitus Type 2 diabetes (type 2 diabetes mellitus) is a long-term (chronic) disease that affects blood sugar (glucose) levels. Normally, a hormone called insulin allows glucose to enter cells in the body. The cells use glucose for energy. In type 2 diabetes, one or both of these problems may be present:  The body does not make enough insulin.  The body does not respond properly to insulin that it makes (insulin resistance). Insulin resistance or lack of insulin causes excess glucose to build up in the blood instead of going into cells. As a result, high blood glucose (hyperglycemia) develops, which can cause many complications. Being overweight or obese and having an inactive (sedentary) lifestyle can increase your risk for diabetes. Type 2 diabetes can be delayed or prevented by making certain nutrition and lifestyle changes. What nutrition changes can be made?   Eat healthy meals and snacks regularly. Keep a healthy snack with you for when you get hungry between meals, such as fruit or a handful of nuts.  Eat lean meats and proteins that are low in saturated fats, such as chicken, fish, egg whites, and beans. Avoid processed meats.  Eat plenty of fruits and  vegetables and plenty of grains that have not been processed (whole grains). It is recommended that you eat: ? 1?2 cups of fruit every day. ? 2?3 cups of vegetables every day. ? 6?8 oz of whole grains every day, such as oats, whole wheat, bulgur, brown rice, quinoa, and millet.  Eat low-fat dairy products, such as milk, yogurt, and cheese.  Eat foods that contain healthy fats, such as nuts, avocado, olive oil, and canola oil.  Drink water throughout the day. Avoid drinks that contain added sugar, such as soda or sweet tea.  Follow instructions from your health care provider about specific eating or drinking restrictions.  Control how much food you eat at a time (portion size). ? Check food labels to find out the serving sizes of foods. ? Use a kitchen scale to weigh amounts of foods.  Saute or steam food instead of frying it. Cook with water or broth instead of oils or butter.  Limit your intake of: ? Salt (sodium). Have no more than 1 tsp (2,400 mg) of sodium a day. If you have heart disease  or high blood pressure, have less than ? tsp (1,500 mg) of sodium a day. ? Saturated fat. This is fat that is solid at room temperature, such as butter or fat on meat. What lifestyle changes can be made? Activity   Do moderate-intensity physical activity for at least 30 minutes on at least 5 days of the week, or as much as told by your health care provider.  Ask your health care provider what activities are safe for you. A mix of physical activities may be best, such as walking, swimming, cycling, and strength training.  Try to add physical activity into your day. For example: ? Park in spots that are farther away than usual, so that you walk more. For example, park in a far corner of the parking lot when you go to the office or the grocery store. ? Take a walk during your lunch break. ? Use stairs instead of elevators or escalators. Weight Loss  Lose weight as directed. Your health care  provider can determine how much weight loss is best for you and can help you lose weight safely.  If you are overweight or obese, you may be instructed to lose at least 5?7 % of your body weight. Alcohol and Tobacco   Limit alcohol intake to no more than 1 drink a day for nonpregnant women and 2 drinks a day for men. One drink equals 12 oz of beer, 5 oz of wine, or 1 oz of hard liquor.  Do not use any tobacco products, such as cigarettes, chewing tobacco, and e-cigarettes. If you need help quitting, ask your health care provider. Work With [place] Provider  Have your blood glucose tested regularly, as told by your health care provider.  Discuss your risk factors and how you can reduce your risk for diabetes.  Get screening tests as told by your health care provider. You may have screening tests regularly, especially if you have certain risk factors for type 2 diabetes.  Make an appointment with a diet and nutrition specialist (registered dietitian). A registered dietitian can help you make a healthy eating plan and can help you understand portion sizes and food labels. Why are these changes important?  It is possible to prevent or delay type 2 diabetes and related health problems by making lifestyle and nutrition changes.  It can be difficult to recognize signs of type 2 diabetes. The best way to avoid possible damage to your body is to take actions to prevent the disease before you develop symptoms. What can happen if changes are not made?  Your blood glucose levels may keep increasing. Having high blood glucose for a long time is dangerous. Too much glucose in your blood can damage your blood vessels, heart, kidneys, nerves, and eyes.  You may develop prediabetes or type 2 diabetes. Type 2 diabetes can lead to many chronic health problems and complications, such as: ? Heart disease. ? Stroke. ? Blindness. ? Kidney disease. ? Depression. ? Poor circulation in the feet and  legs, which could lead to surgical removal (amputation) in severe cases. Where to find support  Ask your health care provider to recommend a registered dietitian, diabetes educator, or weight loss program.  Look for local or online weight loss groups.  Join a gym, fitness club, or outdoor activity group, such as a walking club. Where to find more information To learn more about diabetes and diabetes prevention, visit:  American Diabetes Association (ADA): www.diabetes.[employer] of Diabetes and  Digestive and Kidney Diseases: FindSpin.nl To learn more about healthy eating, visit:  The U.S. Department of Agriculture Scientist, research (physical sciences)), Choose My Plate: http://wiley-williams.com/  Office of Disease Prevention and Health Promotion (ODPHP), Dietary Guidelines: SurferLive.at Summary  You can reduce your risk for type 2 diabetes by increasing your physical activity, eating healthy foods, and losing weight as directed.  Talk with your health care provider about your risk for type 2 diabetes. Ask about any blood tests or screening tests that you need to have. This information is not intended to replace advice given to you by your health care provider. Make sure you discuss any questions you have with your health care provider. Document Released: 03/22/2016 Document Revised: 03/23/2019 Document Reviewed: 01/20/2016 Elsevier Patient Education  [place].  Budget-Friendly Healthy Eating There are many ways to save money at the grocery store and continue to eat healthy. You can be successful if you:  Plan meals according to your budget.  Make a grocery list and only purchase food according to your grocery list.  Prepare food yourself. What are tips for following this plan?  Reading food labels  Compare food labels between brand name foods and the store brand. Often the nutritional value is the same, but the store brand  is lower cost.  Look for products that do not have added sugar, fat, or salt (sodium). These often cost the same but are healthier for you. Products may be labeled as: ? Sugar-free. ? Nonfat. ? Low-fat. ? Sodium-free. ? Low-sodium.  Look for lean ground beef labeled as at least 92% lean and 8% fat. Shopping  Buy only the items on your grocery list and go only to the areas of the store that have the items on your list.  Use coupons only for foods and brands you normally buy. Avoid buying items you wouldn't normally buy simply because they are on sale.  Check online and in newspapers for weekly deals.  Buy healthy items from the bulk bins when available, such as herbs, spices, flour, pasta, nuts, and dried fruit.  Buy fruits and vegetables that are in season. Prices are usually lower on in-season produce.  Look at the unit price on the price tag. Use it to compare different brands and sizes to find out which item is the best deal.  Choose healthy items that are often low-cost, such as carrots, potatoes, apples, bananas, and oranges. Dried or canned beans are a low-cost protein source.  Buy in bulk and freeze extra food. Items you can buy in bulk include meats, fish, poultry, frozen fruits, and frozen vegetables.  Avoid buying "ready-to-eat" foods, such as pre-cut fruits and vegetables and pre-made salads.  If possible, shop around to discover where you can find the best prices. Consider other retailers such as dollar stores, larger [employer], local fruit and vegetable stands, and farmers markets.  Do not shop when you are hungry. If you shop while hungry, it may be hard to stick to your list and budget.  Resist impulse buying. Use your grocery list as your official plan for the week.  Buy a variety of vegetables and fruits by purchasing fresh, frozen, and canned items.  Look at the top and bottom shelves for deals. Foods at eye level (eye level of an adult or child) are  usually more expensive.  Be efficient with your time when shopping. The more time you spend at the store, the more money you are likely to spend.  To save money when choosing  more expensive foods like meats and dairy: ? Choose cheaper cuts of meat, such as bone-in chicken thighs and drumsticks instead of skinless and boneless chicken. When you are ready to prepare the chicken, you can remove the skin yourself to make it healthier. ? Choose lean meats like chicken or [place] instead of beef. ? Choose canned seafood, such as tuna, salmon, or sardines. ? Buy eggs as a low-cost source of protein. ? Buy dried beans and peas, such as lentils, split peas, or kidney beans instead of meats. Dried beans and peas are a good alternative source of protein. ? Buy the larger tubs of yogurt instead of individual-sized containers.  Choose water instead of sodas and other sweetened beverages.  Avoid buying chips, cookies, and other "junk food." These items are usually expensive and not healthy. Cooking  Make extra food and freeze the extras in meal-sized containers or in individual portions for fast meals and snacks.  Pre-cook on days when you have extra time to prepare meals in advance. You can keep these meals in the fridge or freezer and reheat for a quick meal.  When you come home from the grocery store, wash, peel, and cut fruits and vegetables so they are ready to use and eat. This will help reduce food waste. Meal planning  Do not eat out or get fast food. Prepare food at home.  Make a grocery list and make sure to bring it with you to the store. If you have a smart phone, you could use your phone to create your shopping list.  Plan meals and snacks according to a grocery list and budget you create.  Use leftovers in your meal plan for the week.  Look for recipes where you can cook once and make enough food for two meals.  Include budget-friendly meals like stews, casseroles, and stir-fry  dishes.  Try some meatless meals or try "no cook" meals like salads.  Make sure that half your plate is filled with fruits or vegetables. Choose from fresh, frozen, or canned fruits and vegetables. If eating canned, remember to rinse them before eating. This will remove any excess salt added for packaging. Summary  Eating healthy on a budget is possible if you plan your meals according to your budget, purchase according to your budget and grocery list, and prepare food yourself.  Tips for buying more food on a limited budget include buying generic brands, using coupons only for foods you normally buy, and buying healthy items from the bulk bins when available.  Tips for buying cheaper food to replace expensive food include choosing cheaper, lean cuts of meat, and buying dried beans and peas. This information is not intended to replace advice given to you by your health care provider. Make sure you discuss any questions you have with your health care provider. Document Released: 08/02/2014 Document Revised: 11/30/2017 Document Reviewed: 11/30/2017 Elsevier Patient Education  2020 [place] Following a healthy eating pattern may help you to achieve and maintain a healthy body weight, reduce the risk of chronic disease, and live a long and productive life. It is important to follow a healthy eating pattern at an appropriate calorie level for your body. Your nutritional needs should be met primarily through food by choosing a variety of nutrient-rich foods. What are tips for following this plan? Reading food labels  Read labels and choose the following: ? Reduced or low sodium. ? Juices with 100% fruit juice. ? Foods with low saturated fats  and high polyunsaturated and monounsaturated fats. ? Foods with whole grains, such as whole wheat, cracked wheat, brown rice, and wild rice. ? Whole grains that are fortified with folic acid. This is recommended for women who are  pregnant or who want to become pregnant.  Read labels and avoid the following: ? Foods with a lot of added sugars. These include foods that contain brown sugar, corn sweetener, corn syrup, dextrose, fructose, glucose, high-fructose corn syrup, honey, invert sugar, lactose, malt syrup, maltose, molasses, raw sugar, sucrose, trehalose, or turbinado sugar.  Do not eat more than the following amounts of added sugar per day:  6 teaspoons (25 g) for women.  9 teaspoons (38 g) for men. ? Foods that contain processed or refined starches and grains. ? Refined grain products, such as white flour, degermed cornmeal, white bread, and white rice. Shopping  Choose nutrient-rich snacks, such as vegetables, whole fruits, and nuts. Avoid high-calorie and high-sugar snacks, such as potato chips, fruit snacks, and candy.  Use oil-based dressings and spreads on foods instead of solid fats such as butter, stick margarine, or cream cheese.  Limit pre-made sauces, mixes, and "instant" products such as flavored rice, instant noodles, and ready-made pasta.  Try more plant-protein sources, such as tofu, tempeh, black beans, edamame, lentils, nuts, and seeds.  Explore eating plans such as the Mediterranean diet or vegetarian diet. Cooking  Use oil to saut or stir-fry foods instead of solid fats such as butter, stick margarine, or lard.  Try baking, boiling, grilling, or broiling instead of frying.  Remove the fatty part of meats before cooking.  Steam vegetables in water or broth. Meal planning   At meals, imagine dividing your plate into fourths: ? One-half of your plate is fruits and vegetables. ? One-fourth of your plate is whole grains. ? One-fourth of your plate is protein, especially lean meats, poultry, eggs, tofu, beans, or nuts.  Include low-fat dairy as part of your daily diet. Lifestyle  Choose healthy options in all settings, including home, work, school, restaurants, or  stores.  Prepare your food safely: ? Wash your hands after handling raw meats. ? Keep food preparation surfaces clean by regularly washing with hot, soapy water. ? Keep raw meats separate from ready-to-eat foods, such as fruits and vegetables. ? Cook seafood, meat, poultry, and eggs to the recommended internal temperature. ? Store foods at safe temperatures. In general:  Keep cold foods at 68F (4.4C) or below.  Keep hot foods at 168F (60C) or above.  Keep your freezer at [facility] (-17.8C) or below.  Foods are no longer safe to eat when they have been between the temperatures of 40-168F (4.4-60C) for more than 2 hours. What foods should I eat? Fruits Aim to eat 2 cup-equivalents of fresh, canned (in natural juice), or frozen fruits each day. Examples of 1 cup-equivalent of fruit include 1 small apple, 8 large strawberries, 1 cup canned fruit,  cup dried fruit, or 1 cup 100% juice. Vegetables Aim to eat 2-3 cup-equivalents of fresh and frozen vegetables each day, including different varieties and colors. Examples of 1 cup-equivalent of vegetables include 2 medium carrots, 2 cups raw, leafy greens, 1 cup chopped vegetable (raw or cooked), or 1 medium baked potato. Grains Aim to eat 6 ounce-equivalents of whole grains each day. Examples of 1 ounce-equivalent of grains include 1 slice of bread, 1 cup ready-to-eat cereal, 3 cups popcorn, or  cup cooked rice, pasta, or cereal. Meats and other proteins Aim to eat 5-6  ounce-equivalents of protein each day. Examples of 1 ounce-equivalent of protein include 1 egg, 1/2 cup nuts or seeds, or 1 tablespoon (16 g) peanut butter. A cut of meat or fish that is the size of a deck of cards is about 3-4 ounce-equivalents.  Of the protein you eat each week, try to have at least 8 ounces come from seafood. This includes salmon, trout, herring, and anchovies. Dairy Aim to eat 3 cup-equivalents of fat-free or low-fat dairy each day. Examples of 1  cup-equivalent of dairy include 1 cup (240 mL) milk, 8 ounces (250 g) yogurt, 1 ounces (44 g) natural cheese, or 1 cup (240 mL) fortified soy milk. Fats and oils  Aim for about 5 teaspoons (21 g) per day. Choose monounsaturated fats, such as canola and olive oils, avocados, peanut butter, and most nuts, or polyunsaturated fats, such as sunflower, corn, and soybean oils, walnuts, pine nuts, sesame seeds, sunflower seeds, and flaxseed. Beverages  Aim for six 8-oz glasses of water per day. Limit coffee to three to five 8-oz cups per day.  Limit caffeinated beverages that have added calories, such as soda and energy drinks.  Limit alcohol intake to no more than 1 drink a day for nonpregnant women and 2 drinks a day for men. One drink equals 12 oz of beer (355 mL), 5 oz of wine (148 mL), or 1 oz of hard liquor (44 mL). Seasoning and other foods  Avoid adding excess amounts of salt to your foods. Try flavoring foods with herbs and spices instead of salt.  Avoid adding sugar to foods.  Try using oil-based dressings, sauces, and spreads instead of solid fats. This information is based on general U.[place] nutrition guidelines. For more information, visit BuildDNA.es. Exact amounts may vary based on your nutrition needs. Summary  A healthy eating plan may help you to maintain a healthy weight, reduce the risk of chronic diseases, and stay active throughout your life.  Plan your meals. Make sure you eat the right portions of a variety of nutrient-rich foods.  Try baking, boiling, grilling, or broiling instead of frying.  Choose healthy options in all settings, including home, work, school, restaurants, or stores. This information is not intended to replace advice given to you by your health care provider. Make sure you discuss any questions you have with your health care provider. Document Released: 03/13/2018 Document Revised: 03/13/2018 Document Reviewed: 03/13/2018 Elsevier Patient  Education  [place].  Seborrheic Keratosis A seborrheic keratosis is a common, noncancerous (benign) skin growth. These growths are velvety, waxy, rough, tan, brown, or black spots that appear on the skin. These skin growths can be flat or raised, and scaly. What are the causes? The cause of this condition is not known. What increases the risk? You are more likely to develop this condition if you:  Have a family history of seborrheic keratosis.  Are 50 or older.  Are pregnant.  Have had estrogen replacement therapy. What are the signs or symptoms? Symptoms of this condition include growths on the face, chest, shoulders, back, or other areas. These growths:  Are usually painless, but may become irritated and itchy.  Can be yellow, brown, black, or other colors.  Are slightly raised or have a flat surface.  Are sometimes rough or wart-like in texture.  Are often velvety or waxy on the surface.  Are round or oval-shaped.  Often occur in groups, but may occur as a single growth. How is this diagnosed? This condition is diagnosed  with a medical history and physical exam.  A sample of the growth may be tested (skin biopsy).  You may need to see a skin specialist (dermatologist). How is this treated? Treatment is not usually needed for this condition, unless the growths are irritated or bleed often.  You may also choose to have the growths removed if you do not like their appearance. ? Most commonly, these growths are treated with a procedure in which liquid nitrogen is applied to "freeze" off the growth (cryosurgery). ? They may also be burned off with electricity (electrocautery) or removed by scraping (curettage). Follow these instructions at home:  Watch your growth for any changes.  Keep all follow-up visits as told by your health care provider. This is important.  Do not scratch or pick at the growth or growths. This can cause them to become irritated or  infected. Contact a health care provider if:  You suddenly have many new growths.  Your growth bleeds, itches, or hurts.  Your growth suddenly becomes larger or changes color. Summary  A seborrheic keratosis is a common, noncancerous (benign) skin growth.  Treatment is not usually needed for this condition, unless the growths are irritated or bleed often.  Watch your growth for any changes.  Contact a health care provider if you suddenly have many new growths or your growth suddenly becomes larger or changes color.  Keep all follow-up visits as told by your health care provider. This is important. This information is not intended to replace advice given to you by your health care provider. Make sure you discuss any questions you have with your health care provider. Document Released: 01/01/2011 Document Revised: 04/13/2018 Document Reviewed: 04/13/2018 Elsevier Patient Education  2020 [employer].

## 2019-11-26 ENCOUNTER — Other Ambulatory Visit: Payer: Self-pay | Admitting: Internal Medicine

## 2019-11-26 ENCOUNTER — Telehealth: Payer: Self-pay | Admitting: Internal Medicine

## 2019-11-26 DIAGNOSIS — R918 Other nonspecific abnormal finding of lung field: Secondary | ICD-10-CM

## 2019-11-26 NOTE — Telephone Encounter (Signed)
Ct chest ordered please schedule [facility]   Thanks [place]

## 2019-11-26 NOTE — Telephone Encounter (Signed)
Pt would like to get the CT scan, not allergic to contrast @ [facility]. Please advise and Thank you!  Call pt @ [phone].

## 2019-12-12 ENCOUNTER — Ambulatory Visit
Admission: RE | Admit: 2019-12-12 | Discharge: 2019-12-12 | Disposition: A | Discharge status: Home / Self Care | Payer: No Typology Code available for payment source | Source: Ambulatory Visit | Attending: Internal Medicine | Admitting: Internal Medicine

## 2019-12-12 ENCOUNTER — Other Ambulatory Visit: Payer: Self-pay

## 2019-12-12 ENCOUNTER — Encounter: Payer: Self-pay | Admitting: Internal Medicine

## 2019-12-12 DIAGNOSIS — R918 Other nonspecific abnormal finding of lung field: Secondary | ICD-10-CM | POA: Diagnosis not present

## 2019-12-13 ENCOUNTER — Encounter: Payer: Self-pay | Admitting: Internal Medicine

## 2020-01-11 ENCOUNTER — Other Ambulatory Visit: Payer: Self-pay | Admitting: Internal Medicine

## 2020-01-11 ENCOUNTER — Ambulatory Visit (INDEPENDENT_AMBULATORY_CARE_PROVIDER_SITE_OTHER): Payer: No Typology Code available for payment source

## 2020-01-11 ENCOUNTER — Other Ambulatory Visit: Payer: Self-pay

## 2020-01-11 ENCOUNTER — Encounter: Payer: Self-pay | Admitting: Internal Medicine

## 2020-01-11 ENCOUNTER — Other Ambulatory Visit (INDEPENDENT_AMBULATORY_CARE_PROVIDER_SITE_OTHER): Payer: No Typology Code available for payment source

## 2020-01-11 DIAGNOSIS — Z23 Encounter for immunization: Secondary | ICD-10-CM | POA: Diagnosis not present

## 2020-01-11 DIAGNOSIS — Z1329 Encounter for screening for other suspected endocrine disorder: Secondary | ICD-10-CM

## 2020-01-11 DIAGNOSIS — B3731 Acute candidiasis of vulva and vagina: Secondary | ICD-10-CM

## 2020-01-11 DIAGNOSIS — E559 Vitamin D deficiency, unspecified: Secondary | ICD-10-CM

## 2020-01-11 DIAGNOSIS — E039 Hypothyroidism, unspecified: Secondary | ICD-10-CM | POA: Diagnosis not present

## 2020-01-11 DIAGNOSIS — R17 Unspecified jaundice: Secondary | ICD-10-CM

## 2020-01-11 DIAGNOSIS — B373 Candidiasis of vulva and vagina: Secondary | ICD-10-CM

## 2020-01-11 DIAGNOSIS — I10 Essential (primary) hypertension: Secondary | ICD-10-CM

## 2020-01-11 DIAGNOSIS — R7303 Prediabetes: Secondary | ICD-10-CM | POA: Insufficient documentation

## 2020-01-11 DIAGNOSIS — Z1389 Encounter for screening for other disorder: Secondary | ICD-10-CM

## 2020-01-11 DIAGNOSIS — N3000 Acute cystitis without hematuria: Secondary | ICD-10-CM

## 2020-01-11 DIAGNOSIS — R739 Hyperglycemia, unspecified: Secondary | ICD-10-CM

## 2020-01-11 LAB — CBC WITH DIFFERENTIAL/PLATELET
Basophils Absolute: 0.1 10*3/uL (ref 0.0–0.1)
Basophils Relative: 1.3 % (ref 0.0–3.0)
Eosinophils Absolute: 0.2 10*3/uL (ref 0.0–0.7)
Eosinophils Relative: 2.2 % (ref 0.0–5.0)
HCT: 41.6 % (ref 36.0–46.0)
Hemoglobin: 13.9 g/dL (ref 12.0–15.0)
Lymphocytes Relative: 29.1 % (ref 12.0–46.0)
Lymphs Abs: 2 10*3/uL (ref 0.7–4.0)
MCHC: 33.5 g/dL (ref 30.0–36.0)
MCV: 91.7 fl (ref 78.0–100.0)
Monocytes Absolute: 0.4 10*3/uL (ref 0.1–1.0)
Monocytes Relative: 6.1 % (ref 3.0–12.0)
Neutro Abs: 4.3 10*3/uL (ref 1.4–7.7)
Neutrophils Relative %: 61.3 % (ref 43.0–77.0)
Platelets: 262 10*3/uL (ref 150.0–400.0)
RBC: 4.53 Mil/uL (ref 3.87–5.11)
RDW: 14 % (ref 11.5–15.5)
WBC: 7 10*3/uL (ref 4.0–10.5)

## 2020-01-11 LAB — COMPREHENSIVE METABOLIC PANEL
ALT: 29 U/L (ref 0–35)
AST: 18 U/L (ref 0–37)
Albumin: 3.7 g/dL (ref 3.5–5.2)
Alkaline Phosphatase: 71 U/L (ref 39–117)
BUN: 21 mg/dL (ref 6–23)
CO2: 28 mEq/L (ref 19–32)
Calcium: 9.1 mg/dL (ref 8.4–10.5)
Chloride: 106 mEq/L (ref 96–112)
Creatinine, Ser: 0.84 mg/dL (ref 0.40–1.20)
GFR: 68.61 mL/min (ref >60.00)
Glucose, Bld: 101 mg/dL — ABNORMAL HIGH (ref 70–99)
Potassium: 3.9 mEq/L (ref 3.5–5.1)
Sodium: 139 mEq/L (ref 135–145)
Total Bilirubin: 0.8 mg/dL (ref 0.2–1.2)
Total Protein: 6.9 g/dL (ref 6.0–8.3)

## 2020-01-11 LAB — URINALYSIS, ROUTINE W REFLEX MICROSCOPIC
Bilirubin Urine: NEGATIVE
Glucose, UA: NEGATIVE
Hgb urine dipstick: NEGATIVE
Hyaline Cast: NONE SEEN /LPF
Nitrite: NEGATIVE
Protein, ur: NEGATIVE
Specific Gravity, Urine: 1.027 (ref 1.001–1.03)
pH: <=5 (ref 5.0–8.0)

## 2020-01-11 LAB — TSH: TSH: 0.98 u[IU]/mL (ref 0.35–4.50)

## 2020-01-11 LAB — BILIRUBIN, DIRECT: Bilirubin, Direct: 0.2 mg/dL (ref 0.0–0.3)

## 2020-01-11 LAB — LIPID PANEL
Cholesterol: 167 mg/dL (ref 0–200)
HDL: 50.5 mg/dL (ref >39.00)
LDL Cholesterol: 98 mg/dL (ref 0–99)
NonHDL: 116.28
Total CHOL/HDL Ratio: 3
Triglycerides: 92 mg/dL (ref 0.0–149.0)
VLDL: 18.4 mg/dL (ref 0.0–40.0)

## 2020-01-11 LAB — HEMOGLOBIN A1C: Hgb A1c MFr Bld: 5.7 % (ref 4.6–6.5)

## 2020-01-11 LAB — VITAMIN D 25 HYDROXY (VIT D DEFICIENCY, FRACTURES): VITD: 19.16 ng/mL — ABNORMAL LOW (ref 30.00–100.00)

## 2020-01-11 MED ORDER — CHOLECALCIFEROL 1.25 MG (50000 UT) PO CAPS: 50000.0000 [IU] | ORAL_CAPSULE | ORAL | 1 refills | Status: DC

## 2020-01-11 MED ORDER — FLUCONAZOLE 150 MG PO TABS: 150.0000 mg | ORAL_TABLET | Freq: Once | ORAL | 0 refills | Status: AC

## 2020-01-11 NOTE — Progress Notes (Signed)
Agree   TMS 

## 2020-01-13 ENCOUNTER — Other Ambulatory Visit: Payer: Self-pay | Admitting: Internal Medicine

## 2020-01-13 DIAGNOSIS — B3731 Acute candidiasis of vulva and vagina: Secondary | ICD-10-CM

## 2020-01-13 DIAGNOSIS — N760 Acute vaginitis: Secondary | ICD-10-CM

## 2020-01-13 DIAGNOSIS — B373 Candidiasis of vulva and vagina: Secondary | ICD-10-CM

## 2020-01-14 ENCOUNTER — Encounter: Payer: Self-pay | Admitting: *Deleted

## 2020-01-18 ENCOUNTER — Other Ambulatory Visit: Payer: Self-pay

## 2020-01-18 ENCOUNTER — Encounter: Payer: Self-pay | Admitting: Internal Medicine

## 2020-01-18 ENCOUNTER — Other Ambulatory Visit: Payer: No Typology Code available for payment source

## 2020-04-28 ENCOUNTER — Other Ambulatory Visit: Payer: Self-pay

## 2020-04-30 ENCOUNTER — Other Ambulatory Visit: Payer: Self-pay

## 2020-04-30 ENCOUNTER — Encounter: Payer: Self-pay | Admitting: Internal Medicine

## 2020-04-30 ENCOUNTER — Ambulatory Visit (INDEPENDENT_AMBULATORY_CARE_PROVIDER_SITE_OTHER): Payer: No Typology Code available for payment source | Admitting: Internal Medicine

## 2020-04-30 ENCOUNTER — Other Ambulatory Visit: Payer: Self-pay | Admitting: Internal Medicine

## 2020-04-30 ENCOUNTER — Ambulatory Visit (INDEPENDENT_AMBULATORY_CARE_PROVIDER_SITE_OTHER): Payer: No Typology Code available for payment source

## 2020-04-30 VITALS — BP 134/86 | HR 74 | Temp 97.5°F | Ht 61.0 in | Wt 237.8 lb

## 2020-04-30 DIAGNOSIS — N3281 Overactive bladder: Secondary | ICD-10-CM

## 2020-04-30 DIAGNOSIS — Z1283 Encounter for screening for malignant neoplasm of skin: Secondary | ICD-10-CM | POA: Diagnosis not present

## 2020-04-30 DIAGNOSIS — M25561 Pain in right knee: Secondary | ICD-10-CM | POA: Diagnosis not present

## 2020-04-30 DIAGNOSIS — L989 Disorder of the skin and subcutaneous tissue, unspecified: Secondary | ICD-10-CM | POA: Diagnosis not present

## 2020-04-30 DIAGNOSIS — G8929 Other chronic pain: Secondary | ICD-10-CM

## 2020-04-30 DIAGNOSIS — E559 Vitamin D deficiency, unspecified: Secondary | ICD-10-CM

## 2020-04-30 DIAGNOSIS — I10 Essential (primary) hypertension: Secondary | ICD-10-CM

## 2020-04-30 DIAGNOSIS — Z1211 Encounter for screening for malignant neoplasm of colon: Secondary | ICD-10-CM

## 2020-04-30 DIAGNOSIS — M25571 Pain in right ankle and joints of right foot: Secondary | ICD-10-CM

## 2020-04-30 DIAGNOSIS — N3000 Acute cystitis without hematuria: Secondary | ICD-10-CM

## 2020-04-30 DIAGNOSIS — Z6841 Body Mass Index (BMI) 40.0 and over, adult: Secondary | ICD-10-CM

## 2020-04-30 DIAGNOSIS — M79671 Pain in right foot: Secondary | ICD-10-CM

## 2020-04-30 MED ORDER — HYDROCHLOROTHIAZIDE 25 MG PO TABS: 25.0000 mg | ORAL_TABLET | Freq: Every day | ORAL | 3 refills | Status: DC

## 2020-04-30 NOTE — Patient Instructions (Addendum)
Tylenol as needed  Voltaren gel 4x per day over the counter   Colonoscopy due Dr. Allen Norris  Referred dermatology and Dr. Roland Rack Increase hctz to 25 mg daily  Mammogram due 05/29/20 call to schedule  Pap due 11/25/20 let me know if you want to see urology soon    Journal for Nurse Practitioners, 15(4), [phone]. Retrieved September 18, 2018 from http://clinicalkey.com/nursing">  Knee Exercises Ask your health care provider which exercises are safe for you. Do exercises exactly as told by your health care provider and adjust them as directed. It is normal to feel mild stretching, pulling, tightness, or discomfort as you do these exercises. Stop right away if you feel sudden pain or your pain gets worse. Do not begin these exercises until told by your health care provider. Stretching and range-of-motion exercises These exercises warm up your muscles and joints and improve the movement and flexibility of your knee. These exercises also help to relieve pain and swelling. Knee extension, prone 1. Lie on your abdomen (prone position) on a bed. 2. Place your left / right knee just beyond the edge of the surface so your knee is not on the bed. You can put a towel under your left / right thigh just above your kneecap for comfort. 3. Relax your leg muscles and allow gravity to straighten your knee (extension). You should feel a stretch behind your left / right knee. 4. Hold this position for __________ seconds. 5. Scoot up so your knee is supported between repetitions. Repeat __________ times. Complete this exercise __________ times a day. Knee flexion, active  1. Lie on your back with both legs straight. If this causes back discomfort, bend your left / right knee so your foot is flat on the floor. 2. Slowly slide your left / right heel back toward your buttocks. Stop when you feel a gentle stretch in the front of your knee or thigh (flexion). 3. Hold this position for __________ seconds. 4. Slowly slide your  left / right heel back to the starting position. Repeat __________ times. Complete this exercise __________ times a day. Quadriceps stretch, prone  1. Lie on your abdomen on a firm surface, such as a bed or padded floor. 2. Bend your left / right knee and hold your ankle. If you cannot reach your ankle or pant leg, loop a belt around your foot and grab the belt instead. 3. Gently pull your heel toward your buttocks. Your knee should not slide out to the side. You should feel a stretch in the front of your thigh and knee (quadriceps). 4. Hold this position for __________ seconds. Repeat __________ times. Complete this exercise __________ times a day. Hamstring, supine 1. Lie on your back (supine position). 2. Loop a belt or towel over the ball of your left / right foot. The ball of your foot is on the walking surface, right under your toes. 3. Straighten your left / right knee and slowly pull on the belt to raise your leg until you feel a gentle stretch behind your knee (hamstring). ? Do not let your knee bend while you do this. ? Keep your other leg flat on the floor. 4. Hold this position for __________ seconds. Repeat __________ times. Complete this exercise __________ times a day. Strengthening exercises These exercises build strength and endurance in your knee. Endurance is the ability to use your muscles for a long time, even after they get tired. Quadriceps, isometric This exercise stretches the muscles in front of your  thigh (quadriceps) without moving your knee joint (isometric). 1. Lie on your back with your left / right leg extended and your other knee bent. Put a rolled towel or small pillow under your knee if told by your health care provider. 2. Slowly tense the muscles in the front of your left / right thigh. You should see your kneecap slide up toward your hip or see increased dimpling just above the knee. This motion will push the back of the knee toward the floor. 3. For  __________ seconds, hold the muscle as tight as you can without increasing your pain. 4. Relax the muscles slowly and completely. Repeat __________ times. Complete this exercise __________ times a day. Straight leg raises This exercise stretches the muscles in front of your thigh (quadriceps) and the muscles that move your hips (hip flexors). 1. Lie on your back with your left / right leg extended and your other knee bent. 2. Tense the muscles in the front of your left / right thigh. You should see your kneecap slide up or see increased dimpling just above the knee. Your thigh may even shake a bit. 3. Keep these muscles tight as you raise your leg 4-6 inches (10-15 cm) off the floor. Do not let your knee bend. 4. Hold this position for __________ seconds. 5. Keep these muscles tense as you lower your leg. 6. Relax your muscles slowly and completely after each repetition. Repeat __________ times. Complete this exercise __________ times a day. Hamstring, isometric 1. Lie on your back on a firm surface. 2. Bend your left / right knee about __________ degrees. 3. Dig your left / right heel into the surface as if you are trying to pull it toward your buttocks. Tighten the muscles in the back of your thighs (hamstring) to "dig" as hard as you can without increasing any pain. 4. Hold this position for __________ seconds. 5. Release the tension gradually and allow your muscles to relax completely for __________ seconds after each repetition. Repeat __________ times. Complete this exercise __________ times a day. Hamstring curls If told by your health care provider, do this exercise while wearing ankle weights. Begin with __________ lb weights. Then increase the weight by 1 lb (0.5 kg) increments. Do not wear ankle weights that are more than __________ lb. 1. Lie on your abdomen with your legs straight. 2. Bend your left / right knee as far as you can without feeling pain. Keep your hips flat against  the floor. 3. Hold this position for __________ seconds. 4. Slowly lower your leg to the starting position. Repeat __________ times. Complete this exercise __________ times a day. Squats This exercise strengthens the muscles in front of your thigh and knee (quadriceps). 1. Stand in front of a table, with your feet and knees pointing straight ahead. You may rest your hands on the table for balance but not for support. 2. Slowly bend your knees and lower your hips like you are going to sit in a chair. ? Keep your weight over your heels, not over your toes. ? Keep your lower legs upright so they are parallel with the table legs. ? Do not let your hips go lower than your knees. ? Do not bend lower than told by your health care provider. ? If your knee pain increases, do not bend as low. 3. Hold the squat position for __________ seconds. 4. Slowly push with your legs to return to standing. Do not use your hands to pull yourself to  standing. Repeat __________ times. Complete this exercise __________ times a day. Wall slides This exercise strengthens the muscles in front of your thigh and knee (quadriceps). 1. Lean your back against a smooth wall or door, and walk your feet out 18-24 inches (46-61 cm) from it. 2. Place your feet hip-width apart. 3. Slowly slide down the wall or door until your knees bend __________ degrees. Keep your knees over your heels, not over your toes. Keep your knees in line with your hips. 4. Hold this position for __________ seconds. Repeat __________ times. Complete this exercise __________ times a day. Straight leg raises This exercise strengthens the muscles that rotate the leg at the hip and move it away from your body (hip abductors). 1. Lie on your side with your left / right leg in the top position. Lie so your head, shoulder, knee, and hip line up. You may bend your bottom knee to help you keep your balance. 2. Roll your hips slightly forward so your hips are  stacked directly over each other and your left / right knee is facing forward. 3. Leading with your heel, lift your top leg 4-6 inches (10-15 cm). You should feel the muscles in your outer hip lifting. ? Do not let your foot drift forward. ? Do not let your knee roll toward the ceiling. 4. Hold this position for __________ seconds. 5. Slowly return your leg to the starting position. 6. Let your muscles relax completely after each repetition. Repeat __________ times. Complete this exercise __________ times a day. Straight leg raises This exercise stretches the muscles that move your hips away from the front of the pelvis (hip extensors). 1. Lie on your abdomen on a firm surface. You can put a pillow under your hips if that is more comfortable. 2. Tense the muscles in your buttocks and lift your left / right leg about 4-6 inches (10-15 cm). Keep your knee straight as you lift your leg. 3. Hold this position for __________ seconds. 4. Slowly lower your leg to the starting position. 5. Let your leg relax completely after each repetition. Repeat __________ times. Complete this exercise __________ times a day. This information is not intended to replace advice given to you by your health care provider. Make sure you discuss any questions you have with your health care provider. Document Revised: 09/19/2018 Document Reviewed: 09/19/2018 Elsevier Patient Education  [place].   Overactive Bladder, Adult  Overactive bladder refers to a condition in which a person has a sudden need to pass urine. The person may leak urine if he or she cannot get to the bathroom fast enough (urinary incontinence). A person with this condition may also wake up several times in the night to go to the bathroom. Overactive bladder is associated with poor nerve signals between your bladder and your brain. Your bladder may get the signal to empty before it is full. You may also have very sensitive muscles that make  your bladder squeeze too soon. These symptoms might interfere with daily work or social activities. What are the causes? This condition may be associated with or caused by:  Urinary tract infection.  Infection of nearby tissues, such as the prostate.  Prostate enlargement.  Surgery on the uterus or urethra.  Bladder stones, inflammation, or tumors.  Drinking too much caffeine or alcohol.  Certain medicines, especially medicines that get rid of extra fluid in the body (diuretics).  Muscle or nerve weakness, especially from: ? A spinal cord injury. ?  Stroke. ? Multiple sclerosis. ? Parkinson's disease.  Diabetes.  Constipation. What increases the risk? You may be at greater risk for overactive bladder if you:  Are an older adult.  Smoke.  Are going through menopause.  Have prostate problems.  Have a neurological disease, such as stroke, dementia, Parkinson's disease, or multiple sclerosis (MS).  Eat or drink things that irritate the bladder. These include alcohol, spicy food, and caffeine.  Are overweight or obese. What are the signs or symptoms? Symptoms of this condition include:  Sudden, strong urge to urinate.  Leaking urine.  Urinating 8 or more times a day.  Waking up to urinate 2 or more times a night. How is this diagnosed? Your health care provider may suspect overactive bladder based on your symptoms. He or she will diagnose this condition by:  A physical exam and medical history.  Blood or urine tests. You might need bladder or urine tests to help determine what is causing your overactive bladder. You might also need to see a health care provider who specializes in urinary tract problems (urologist). How is this treated? Treatment for overactive bladder depends on the cause of your condition and whether it is mild or severe. You can also make lifestyle changes at home. Options include:  Bladder training. This may include: ? Learning to control  the urge to urinate by following a schedule that directs you to urinate at regular intervals (timed voiding). ? Doing Kegel exercises to strengthen your pelvic floor muscles, which support your bladder. Toning these muscles can help you control urination, even if your bladder muscles are overactive.  Special devices. This may include: ? Biofeedback, which uses sensors to help you become aware of your body's signals. ? Electrical stimulation, which uses electrodes placed inside the body (implanted) or outside the body. These electrodes send gentle pulses of electricity to strengthen the nerves or muscles that control the bladder. ? Women may use a plastic device that fits into the vagina and supports the bladder (pessary).  Medicines. ? Antibiotics to treat bladder infection. ? Antispasmodics to stop the bladder from releasing urine at the wrong time. ? Tricyclic antidepressants to relax bladder muscles. ? Injections of botulinum toxin type A directly into the bladder tissue to relax bladder muscles.  Lifestyle changes. This may include: ? Weight loss. Talk to your health care provider about weight loss methods that would work best for you. ? Diet changes. This may include reducing how much alcohol and caffeine you consume, or drinking fluids at different times of the day. ? Not smoking. Do not use any products that contain nicotine or tobacco, such as cigarettes and e-cigarettes. If you need help quitting, ask your health care provider.  Surgery. ? A device may be implanted to help manage the nerve signals that control urination. ? An electrode may be implanted to stimulate electrical signals in the bladder. ? A procedure may be done to change the shape of the bladder. This is done only in very severe cases. Follow these instructions at home: Lifestyle  Make any diet or lifestyle changes that are recommended by your health care provider. These may include: ? Drinking less fluid or drinking  fluids at different times of the day. ? Cutting down on caffeine or alcohol. ? Doing Kegel exercises. ? Losing weight if needed. ? Eating a healthy and balanced diet to prevent constipation. This may include:  Eating foods that are high in fiber, such as fresh fruits and vegetables, whole grains, and  beans.  Limiting foods that are high in fat and processed sugars, such as fried and sweet foods. General instructions  Take over-the-counter and prescription medicines only as told by your health care provider.  If you were prescribed an antibiotic medicine, take it as told by your health care provider. Do not stop taking the antibiotic even if you start to feel better.  Use any implants or pessary as told by your health care provider.  If needed, wear pads to absorb urine leakage.  Keep a journal or log to track how much and when you drink and when you feel the need to urinate. This will help your health care provider monitor your condition.  Keep all follow-up visits as told by your health care provider. This is important. Contact a health care provider if:  You have a fever.  Your symptoms do not get better with treatment.  Your pain and discomfort get worse.  You have more frequent urges to urinate. Get help right away if:  You are not able to control your bladder. Summary  Overactive bladder refers to a condition in which a person has a sudden need to pass urine.  Several conditions may lead to an overactive bladder.  Treatment for overactive bladder depends on the cause and severity of your condition.  Follow your health care provider's instructions about lifestyle changes, doing Kegel exercises, keeping a journal, and taking medicines. This information is not intended to replace advice given to you by your health care provider. Make sure you discuss any questions you have with your health care provider. Document Revised: 03/22/2019 Document Reviewed:  12/15/2017 Elsevier Patient Education  [place].  Seborrheic Keratosis A seborrheic keratosis is a common, noncancerous (benign) skin growth. These growths are velvety, waxy, rough, tan, brown, or black spots that appear on the skin. These skin growths can be flat or raised, and scaly. What are the causes? The cause of this condition is not known. What increases the risk? You are more likely to develop this condition if you:  Have a family history of seborrheic keratosis.  Are 50 or older.  Are pregnant.  Have had estrogen replacement therapy. What are the signs or symptoms? Symptoms of this condition include growths on the face, chest, shoulders, back, or other areas. These growths:  Are usually painless, but may become irritated and itchy.  Can be yellow, brown, black, or other colors.  Are slightly raised or have a flat surface.  Are sometimes rough or wart-like in texture.  Are often velvety or waxy on the surface.  Are round or oval-shaped.  Often occur in groups, but may occur as a single growth. How is this diagnosed? This condition is diagnosed with a medical history and physical exam.  A sample of the growth may be tested (skin biopsy).  You may need to see a skin specialist (dermatologist). How is this treated? Treatment is not usually needed for this condition, unless the growths are irritated or bleed often.  You may also choose to have the growths removed if you do not like their appearance. ? Most commonly, these growths are treated with a procedure in which liquid nitrogen is applied to "freeze" off the growth (cryosurgery). ? They may also be burned off with electricity (electrocautery) or removed by scraping (curettage). Follow these instructions at home:  Watch your growth for any changes.  Keep all follow-up visits as told by your health care provider. This is important.  Do not scratch or  pick at the growth or growths. This can cause  them to become irritated or infected. Contact a health care provider if:  You suddenly have many new growths.  Your growth bleeds, itches, or hurts.  Your growth suddenly becomes larger or changes color. Summary  A seborrheic keratosis is a common, noncancerous (benign) skin growth.  Treatment is not usually needed for this condition, unless the growths are irritated or bleed often.  Watch your growth for any changes.  Contact a health care provider if you suddenly have many new growths or your growth suddenly becomes larger or changes color.  Keep all follow-up visits as told by your health care provider. This is important. This information is not intended to replace advice given to you by your health care provider. Make sure you discuss any questions you have with your health care provider. Document Revised: 04/13/2018 Document Reviewed: 04/13/2018 Elsevier Patient Education  [place].   Kegel Exercises  Kegel exercises can help strengthen your pelvic floor muscles. The pelvic floor is a group of muscles that support your rectum, small intestine, and bladder. In females, pelvic floor muscles also help support the womb (uterus). These muscles help you control the flow of urine and stool. Kegel exercises are painless and simple, and they do not require any equipment. Your provider may suggest Kegel exercises to:  Improve bladder and bowel control.  Improve sexual response.  Improve weak pelvic floor muscles after surgery to remove the uterus (hysterectomy) or pregnancy (females).  Improve weak pelvic floor muscles after prostate gland removal or surgery (males). Kegel exercises involve squeezing your pelvic floor muscles, which are the same muscles you squeeze when you try to stop the flow of urine or keep from passing gas. The exercises can be done while sitting, standing, or lying down, but it is best to vary your position. Exercises How to do Kegel  exercises: 1. Squeeze your pelvic floor muscles tight. You should feel a tight lift in your rectal area. If you are a female, you should also feel a tightness in your vaginal area. Keep your stomach, buttocks, and legs relaxed. 2. Hold the muscles tight for up to 10 seconds. 3. Breathe normally. 4. Relax your muscles. 5. Repeat as told by your health care provider. Repeat this exercise daily as told by your health care provider. Continue to do this exercise for at least 4-6 weeks, or for as long as told by your health care provider. You may be referred to a physical therapist who can help you learn more about how to do Kegel exercises. Depending on your condition, your health care provider may recommend:  Varying how long you squeeze your muscles.  Doing several sets of exercises every day.  Doing exercises for several weeks.  Making Kegel exercises a part of your regular exercise routine. This information is not intended to replace advice given to you by your health care provider. Make sure you discuss any questions you have with your health care provider. Document Revised: 07/19/2018 Document Reviewed: 07/19/2018 Elsevier Patient Education  [place].

## 2020-04-30 NOTE — Progress Notes (Signed)
Chief Complaint  Patient presents with  . Skin Problem    Patient having two growing lumpy spots on the lower right side of her face. Mole like apperance.    F/u  1. HTN elevated today on norvasc 5 and hctz 12.5 mg qd, losartan 100 mg qd  2. Right face skin lesions becoming more raised, increased in size wants to see Dr. Elmer Ramp in [place] 3. C/o right knee pain chronic mild to moderate and wants referral to Dr. Roland Rack  4. H/o urinary freq and UTI and at times trouble emptying bladder and urgency consider Dr. Alinda Money in future  Review of Systems  Constitutional: Negative for weight loss.  HENT: Negative for hearing loss.   Eyes: Negative for blurred vision.  Respiratory: Negative for shortness of breath.   Cardiovascular: Negative for chest pain.  Gastrointestinal: Negative for abdominal pain.  Genitourinary: Positive for frequency.  Musculoskeletal: Positive for joint pain. Negative for falls.  Skin: Negative for rash.  Neurological: Negative for headaches.  Psychiatric/Behavioral: Negative for depression.   Past Medical History:  Diagnosis Date  . Anxiety   . Asthma   . Gallstones   . HTN (hypertension)   . Hypothyroidism   . Kidney stone    05/29/18  . Low back pain   . Lung nodule   . Obesity   . Vitamin D deficiency    Past Surgical History:  Procedure Laterality Date  . APPENDECTOMY    . DILATION AND CURETTAGE OF UTERUS    . EXTRACORPOREAL SHOCK WAVE LITHOTRIPSY Left 06/22/2018   Procedure: EXTRACORPOREAL SHOCK WAVE LITHOTRIPSY (ESWL);  Surgeon: Abbie Sons, MD;  Location: [facility] ORS;  Service: Urology;  Laterality: Left;  . LIPOMA EXCISION     Family History  Problem Relation Age of Onset  . Alzheimer's disease Mother   . Hypertension Mother   . Cancer Mother 5       colon  . Lupus Daughter   . Heart attack Maternal Grandmother   . Breast cancer Other   . Breast cancer Maternal Aunt 7  . Bladder Cancer Neg Hx   . Kidney cancer Neg Hx    Social  History   Socioeconomic History  . Marital status: Married    Spouse name: Not on file  . Number of children: 2  . Years of education: Not on file  . Highest education level: Not on file  Occupational History  . 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 Smoking status: Never Smoker  . Smokeless tobacco: Never Used  Substance and Sexual Activity  . Alcohol use: No    Alcohol/week: 0.0 standard drinks  . Drug use: No  . Sexual activity: Yes    Birth control/protection: Post-menopausal  Other Topics Concern  . Not on file  Social History Narrative   Lives in [place].   Works at [employer] with GI.    From [facility]   As of 11/2019 married 66 years    2 kids       Regular Exercise -  Not at this time   Daily Caffeine Use:  1 cup hot tea daily         Social Determinants of Health   Financial Resource Strain:   . Difficulty of Paying Living Expenses:   Food Insecurity:   . Worried About Charity fundraiser in the Last Year:   . [place] in the  Last Year:   Transportation Needs:   . Film/video editor (Medical):   Marland Kitchen Lack of Transportation (Non-Medical):   Physical Activity:   . Days of Exercise per Week:   . Minutes of Exercise per Session:   Stress:   . Feeling of Stress :   Social Connections:   . Frequency of Communication with Friends and Family:   . Frequency of Social Gatherings with Friends and Family:   . Attends Religious Services:   . Active Member of Clubs or Organizations:   . Attends Archivist Meetings:   Marland Kitchen Marital Status:   Intimate Partner Violence:   . Fear of Current or Ex-Partner:   . Emotionally Abused:   Marland Kitchen Physically Abused:   . Sexually Abused:    Current Meds  Medication Sig  . albuterol (PROAIR HFA) 108 (90 Base) MCG/ACT inhaler Inhale 1-2 puffs into the lungs every 6 (six) hours as needed for wheezing or shortness of breath.  . ALPRAZolam (XANAX) 0.25 MG tablet Take 0.5  tablets (0.125 mg total) by mouth daily as needed.  Marland Kitchen amLODipine (NORVASC) 5 MG tablet Take 1 tablet (5 mg total) by mouth every morning.  . budesonide-formoterol (SYMBICORT) 80-4.5 MCG/ACT inhaler Inhale 2 puffs into the lungs 2 (two) times daily. Rinse mouth  . Cholecalciferol (VITAMIN D3) 25 MCG (1000 UT) CAPS Take by mouth daily.  Marland Kitchen ipratropium (ATROVENT) 0.06 % nasal spray Place 2 sprays into both nostrils 3 (three) times daily.  Marland Kitchen levothyroxine (SYNTHROID) 175 MCG tablet Take 1 tablet (175 mcg total) by mouth daily before breakfast.  . losartan (COZAAR) 100 MG tablet Take 100 mg by mouth daily.  . montelukast (SINGULAIR) 10 MG tablet Take 1 tablet (10 mg total) by mouth at bedtime.  . sodium chloride (OCEAN) 0.65 % SOLN nasal spray Place 1 spray into both nostrils as needed for congestion. Before atrovent  . [DISCONTINUED] hydrochlorothiazide (MICROZIDE) 12.5 MG capsule Take 12.5 mg by mouth daily.  . [DISCONTINUED] losartan-hydrochlorothiazide (HYZAAR) 100-12.5 MG tablet Take 1 tablet by mouth daily.   No Known Allergies Recent Results (from the past 2160 hour(s))  Urinalysis, Routine w reflex microscopic     Status: Abnormal   Collection Time: 04/30/20  3:43 PM  Result Value Ref Range   Color, Urine YELLOW YELLOW   APPearance CLEAR CLEAR   Specific Gravity, Urine 1.021 1.001 - 1.03   pH < OR = 5.0 5.0 - 8.0   Glucose, UA NEGATIVE NEGATIVE   Bilirubin Urine NEGATIVE NEGATIVE   Ketones, ur NEGATIVE NEGATIVE   Hgb urine dipstick NEGATIVE NEGATIVE   Protein, ur NEGATIVE NEGATIVE   Nitrite NEGATIVE NEGATIVE   Leukocytes,Ua 1+ (A) NEGATIVE   WBC, UA 10-20 (A) 0 - 5 /HPF   RBC / HPF NONE SEEN 0 - 2 /HPF   Squamous Epithelial / LPF 0-5 < OR = 5 /HPF   Bacteria, UA NONE SEEN NONE SEEN /HPF   Hyaline Cast NONE SEEN NONE SEEN /LPF   Objective  Body mass index is 44.93 kg/m. Wt Readings from Last 3 Encounters:  04/30/20 237 lb 12.8 oz (107.9 kg)  11/23/19 235 lb (106.6 kg)   11/17/18 230 lb (104.3 kg)   Temp Readings from Last 3 Encounters:  04/30/20 (!) 97.5 F (36.4 C) (Temporal)  11/17/18 98.1 F (36.7 C) (Oral)  06/22/18 (!) 97.5 F (36.4 C) (Temporal)   BP Readings from Last 3 Encounters:  04/30/20 134/86  11/17/18 136/68  07/14/18 123/85   Pulse  Readings from Last 3 Encounters:  04/30/20 74  11/17/18 67  07/14/18 67    Physical Exam Vitals and nursing note reviewed.  Constitutional:      Appearance: Normal appearance. She is well-developed and well-groomed. She is morbidly obese.  HENT:     Head: Normocephalic and atraumatic.  Eyes:     Conjunctiva/sclera: Conjunctivae normal.     Pupils: Pupils are equal, round, and reactive to light.  Cardiovascular:     Rate and Rhythm: Normal rate and regular rhythm.     Heart sounds: Normal heart sounds. No murmur.  Pulmonary:     Effort: Pulmonary effort is normal.     Breath sounds: Normal breath sounds.  Skin:    General: Skin is warm and dry.  Neurological:     General: No focal deficit present.     Mental Status: She is alert and oriented to person, place, and time. Mental status is at baseline.     Gait: Gait normal.  Psychiatric:        Attention and Perception: Attention and perception normal.        Mood and Affect: Mood and affect normal.        Speech: Speech normal.        Behavior: Behavior normal. Behavior is cooperative.        Thought Content: Thought content normal.        Cognition and Memory: Cognition and memory normal.        Judgment: Judgment normal.     Assessment  Plan  Essential hypertension - Plan: hydrochlorothiazide (HYDRODIURIL) 25 MG tablet Cont other meds   Skin cancer screening - Plan: Ambulatory referral to Dermatology Skin lesion of face - Plan: Ambulatory referral to Dermatology  Overactive bladder - Plan: Urinalysis, Routine w reflex microscopic, Urine Culture Acute cystitis without hematuria - Plan: Urinalysis, Routine w reflex microscopic,  Urine Culture Consider Dr. Alinda Money   Chronic pain of right knee - Plan: DG Knee Complete 4 Views Right, Ambulatory referral to Orthopedic Surgery Dr. Roland Rack  Encounter for screening colonoscopy - Plan: Ambulatory referral to Gastroenterology Dr. Allen Norris   Morbid obesity with BMI of 40.0-44.9, adult ([place])  rec healthy diet and exercise   HM Had fluutd Tdap had 01/11/20  pna 23 had 11/25/17  covid 2/2 will bring in cards as of 04/30/20 Hep B immuneconsider hep A vaccine h/o fatty liver shingrix2/2 doses MMR immune Hep C negative   mammo neg 05/30/19 negative referred another mammo Pap neg 11/25/17 neg HPV pap due in 2021 colonoscopy had 06/07/14 Dr. Allen Norris polpys hyperplastic and tubular -pt wants to hold as of 11/23/2019 and will call Dr. Allen Norris when ready it is currently due  rec healthy diet choices and exercise currently doing 20-25 min on treadmill  CT chest recommended to f/u CT ab/pelvis 05/2018 noted lung nodules  -as of 11/23/19 pt to call back with facility she wants to go to [place] costs to much  12/12/2019 IMPRESSION: Scattered tiny pulmonary nodules bilaterally. Some of these are calcified and likely related to postinflammatory change. The visualized nodules in the lower lobes are stable from the prior CT from 2019 consistent with a prior infectious etiology. No sizable nodules are seen. Given the low risk history, no further follow-up is recommended.  Cholelithiasis without complicating factors.  Aortic Atherosclerosis (ICD10-I70.0).  -consider dermatology in future hold for now as of 11/23/19 will let me know when wants referral Referred today   GI referred today  Provider: Dr. Olivia Mackie McLean-Scocuzza-Internal Medicine

## 2020-05-01 LAB — URINALYSIS, ROUTINE W REFLEX MICROSCOPIC
Bacteria, UA: NONE SEEN /HPF
Bilirubin Urine: NEGATIVE
Glucose, UA: NEGATIVE
Hgb urine dipstick: NEGATIVE
Hyaline Cast: NONE SEEN /LPF
Ketones, ur: NEGATIVE
Nitrite: NEGATIVE
Protein, ur: NEGATIVE
RBC / HPF: NONE SEEN /HPF (ref 0–2)
Specific Gravity, Urine: 1.021 (ref 1.001–1.03)
pH: <=5 (ref 5.0–8.0)

## 2020-05-01 LAB — URINE CULTURE
MICRO NUMBER:: 10496232
SPECIMEN QUALITY:: ADEQUATE

## 2020-06-06 ENCOUNTER — Other Ambulatory Visit: Payer: Self-pay | Admitting: Surgery

## 2020-06-06 DIAGNOSIS — D2121 Benign neoplasm of connective and other soft tissue of right lower limb, including hip: Secondary | ICD-10-CM

## 2020-06-09 ENCOUNTER — Telehealth: Payer: Self-pay | Admitting: Internal Medicine

## 2020-06-09 NOTE — Telephone Encounter (Signed)
Patient called about her referral to Orthopedic, she called the office and was told that they did not have a referral.

## 2020-06-10 ENCOUNTER — Ambulatory Visit
Admission: RE | Admit: 2020-06-10 | Discharge: 2020-06-10 | Disposition: A | Discharge status: Home / Self Care | Payer: No Typology Code available for payment source | Source: Ambulatory Visit | Attending: Surgery | Admitting: Surgery

## 2020-06-10 ENCOUNTER — Other Ambulatory Visit: Payer: Self-pay

## 2020-06-10 DIAGNOSIS — D2121 Benign neoplasm of connective and other soft tissue of right lower limb, including hip: Secondary | ICD-10-CM | POA: Diagnosis present

## 2020-06-11 ENCOUNTER — Telehealth: Payer: Self-pay | Admitting: Internal Medicine

## 2020-06-11 NOTE — Telephone Encounter (Signed)
Rejection Reason - Patient Declined - Pt called and canceled the appointment. SS " [facility] said 4 minutes ago

## 2020-07-15 ENCOUNTER — Other Ambulatory Visit: Payer: Self-pay

## 2020-07-15 ENCOUNTER — Encounter
Admission: RE | Admit: 2020-07-15 | Discharge: 2020-07-15 | Disposition: A | Discharge status: Home / Self Care | Payer: No Typology Code available for payment source | Source: Ambulatory Visit | Attending: Surgery | Admitting: Surgery

## 2020-07-15 ENCOUNTER — Other Ambulatory Visit: Payer: Self-pay | Admitting: Surgery

## 2020-07-15 ENCOUNTER — Other Ambulatory Visit
Admission: RE | Admit: 2020-07-15 | Discharge: 2020-07-15 | Disposition: A | Discharge status: Home / Self Care | Payer: No Typology Code available for payment source | Source: Ambulatory Visit | Attending: Surgery | Admitting: Surgery

## 2020-07-15 DIAGNOSIS — Z20822 Contact with and (suspected) exposure to covid-19: Secondary | ICD-10-CM | POA: Insufficient documentation

## 2020-07-15 DIAGNOSIS — Z01812 Encounter for preprocedural laboratory examination: Secondary | ICD-10-CM | POA: Insufficient documentation

## 2020-07-15 DIAGNOSIS — I10 Essential (primary) hypertension: Secondary | ICD-10-CM | POA: Insufficient documentation

## 2020-07-15 DIAGNOSIS — Z01818 Encounter for other preprocedural examination: Secondary | ICD-10-CM | POA: Insufficient documentation

## 2020-07-15 HISTORY — DX: Personal history of urinary calculi: Z87.442

## 2020-07-15 LAB — SARS CORONAVIRUS 2 (TAT 6-24 HRS): SARS Coronavirus 2: NEGATIVE

## 2020-07-15 LAB — POTASSIUM: Potassium: 3.5 mmol/L (ref 3.5–5.1)

## 2020-07-15 NOTE — Patient Instructions (Addendum)
Your procedure is scheduled on: tomorrow Report to Day Surgery.as instructed .  Remember: Instructions that are not followed completely may result in serious medical risk,  up to and including death, or upon the discretion of your surgeon and anesthesiologist your  surgery may need to be rescheduled.     _X__ 1. Do not eat food after midnight the night before your procedure.                 No gum chewing or hard candies. You may drink clear liquids up to 2 hours                 before you are scheduled to arrive for your surgery- DO not drink clear                 liquids within 2 hours of the start of your surgery.                 Clear Liquids include:  water, apple juice without pulp, clear Gatorade, G2 or                  Gatorade Zero (avoid Red/Purple/Blue), Black Coffee or Tea (Do not add                 anything to coffee or tea). __x___2.   Complete the carbohydrate drink provided to you, 2 hours before arrival.  __X__2.  On the morning of surgery brush your teeth with toothpaste and water, you                may rinse your mouth with mouthwash if you wish.  Do not swallow any toothpaste of mouthwash.     ___ 3.  No Alcohol for 24 hours before or after surgery.   ___ 4.  Do Not Smoke or use e-cigarettes For 24 Hours Prior to Your Surgery.                 Do not use any chewable tobacco products for at least 6 hours prior to                 Surgery.  ___  5.  Do not use any recreational drugs (marijuana, cocaine, heroin, ecstacy, MDMA or other)                For at least one week prior to your surgery.  Combination of these drugs with anesthesia                May have life threatening results.  ____  6.  Bring all medications with you on the day of surgery if instructed.   _x___  7.  Notify your doctor if there is any change in your medical condition      (cold, fever, infections).     Do not wear jewelry, make-up, hairpins, clips or nail  polish. Do not wear lotions, powders, or perfumes. You may wear deodorant. Do not shave 48 hours prior to surgery.  Do not bring valuables to the hospital.    [facility] is not responsible for any belongings or valuables.  Contacts, dentures or bridgework may not be worn into surgery. Leave your suitcase in the car. After surgery it may be brought to your room. For patients admitted to the hospital, discharge time is determined by your treatment team.   Patients discharged the day of surgery will not be allowed to drive home.   Make arrangements for  someone to be with you for the first 24 hours of your Same Day Discharge.    Please read over the following fact sheets that you were given:    __x__ Take these medicines the morning of surgery with A SIP OF WATER:    1. ALPRAZolam (XANAX) 0.25 MG tablet if needed   2.amLODipine (NORVASC) 5 MG tablet   3. levothyroxine (SYNTHROID) 175 MCG tablet  4.  5.  6.  ____ Fleet Enema (as directed)   __x__ shower with normal products tonight and morning of surgery  ____ Use Benzoyl Peroxide Gel as instructed x ____ Use inhalers on the day of surgery albuterol (PROAIR HFA) 108 (90 Base) MCG/ACT inhaler and bring with you,budesonide-formoterol (SYMBICORT) 80-4.5 MCG/ACT inhale   ____ Stop metformin 2 days prior to surgery    ____ Take 1/2 of usual insulin dose the night before surgery. No insulin the morning          of surgery.   ____ Stop Coumadin/Plavix/aspirin on   _x___ Stop Anti-inflammatories on ibuprofen (ADVIL) 200 MG tablet,naproxen sodium (ALEVE) 220 MG tablet aspirin products.  May take tylenol    ____ Stop supplements until after surgery.    ____ Bring C-Pap to the hospital.

## 2020-07-16 ENCOUNTER — Ambulatory Visit
Admission: RE | Admit: 2020-07-16 | Discharge: 2020-07-16 | Disposition: A | Discharge status: Home / Self Care | Payer: No Typology Code available for payment source | Attending: Surgery | Admitting: Surgery

## 2020-07-16 ENCOUNTER — Encounter: Payer: Self-pay | Admitting: Surgery

## 2020-07-16 ENCOUNTER — Ambulatory Visit: Payer: No Typology Code available for payment source | Admitting: Anesthesiology

## 2020-07-16 ENCOUNTER — Other Ambulatory Visit: Payer: Self-pay

## 2020-07-16 ENCOUNTER — Ambulatory Visit: Payer: No Typology Code available for payment source | Admitting: Urgent Care

## 2020-07-16 ENCOUNTER — Encounter
Admission: RE | Disposition: A | Discharge status: Home / Self Care | Payer: Self-pay | Source: Home / Self Care | Attending: Surgery

## 2020-07-16 DIAGNOSIS — Z6841 Body Mass Index (BMI) 40.0 and over, adult: Secondary | ICD-10-CM | POA: Insufficient documentation

## 2020-07-16 DIAGNOSIS — E039 Hypothyroidism, unspecified: Secondary | ICD-10-CM | POA: Insufficient documentation

## 2020-07-16 DIAGNOSIS — M1711 Unilateral primary osteoarthritis, right knee: Secondary | ICD-10-CM | POA: Insufficient documentation

## 2020-07-16 DIAGNOSIS — Z79890 Hormone replacement therapy (postmenopausal): Secondary | ICD-10-CM | POA: Diagnosis not present

## 2020-07-16 DIAGNOSIS — R2241 Localized swelling, mass and lump, right lower limb: Secondary | ICD-10-CM | POA: Diagnosis present

## 2020-07-16 DIAGNOSIS — Z79899 Other long term (current) drug therapy: Secondary | ICD-10-CM | POA: Diagnosis not present

## 2020-07-16 DIAGNOSIS — I10 Essential (primary) hypertension: Secondary | ICD-10-CM | POA: Diagnosis not present

## 2020-07-16 DIAGNOSIS — F419 Anxiety disorder, unspecified: Secondary | ICD-10-CM | POA: Diagnosis not present

## 2020-07-16 DIAGNOSIS — D1723 Benign lipomatous neoplasm of skin and subcutaneous tissue of right leg: Secondary | ICD-10-CM | POA: Insufficient documentation

## 2020-07-16 HISTORY — PX: MASS EXCISION: SHX2000

## 2020-07-16 SURGERY — MINOR EXCISION OF MASS
Anesthesia: General | Site: Knee | Laterality: Right

## 2020-07-16 MED ORDER — ROCURONIUM BROMIDE 100 MG/10ML IV SOLN
INTRAVENOUS | Status: DC | PRN
  Administered 2020-07-16: 5 mg via INTRAVENOUS

## 2020-07-16 MED ORDER — NEOMYCIN-POLYMYXIN B GU 40-200000 IR SOLN
Status: AC
  Filled 2020-07-16: qty 2

## 2020-07-16 MED ORDER — EPHEDRINE 5 MG/ML INJ
INTRAVENOUS | Status: AC
  Filled 2020-07-16: qty 10

## 2020-07-16 MED ORDER — BUPIVACAINE HCL (PF) 0.5 % IJ SOLN
INTRAMUSCULAR | Status: DC | PRN
  Administered 2020-07-16: 10 mL

## 2020-07-16 MED ORDER — MIDAZOLAM HCL 2 MG/2ML IJ SOLN
INTRAMUSCULAR | Status: DC | PRN
  Administered 2020-07-16: 2 mg via INTRAVENOUS

## 2020-07-16 MED ORDER — MIDAZOLAM HCL 2 MG/2ML IJ SOLN
INTRAMUSCULAR | Status: AC
  Filled 2020-07-16: qty 2

## 2020-07-16 MED ORDER — GLYCOPYRROLATE 0.2 MG/ML IJ SOLN
INTRAMUSCULAR | Status: DC | PRN
  Administered 2020-07-16: .2 mg via INTRAVENOUS

## 2020-07-16 MED ORDER — DEXMEDETOMIDINE HCL IN NACL 200 MCG/50ML IV SOLN
INTRAVENOUS | Status: DC | PRN
Start: 2020-07-16 — End: 2020-07-16
  Administered 2020-07-16: 12 ug via INTRAVENOUS

## 2020-07-16 MED ORDER — NEOMYCIN-POLYMYXIN B GU 40-200000 IR SOLN
Status: DC | PRN
  Administered 2020-07-16: 2 mL

## 2020-07-16 MED ORDER — CEFAZOLIN SODIUM-DEXTROSE 2-4 GM/100ML-% IV SOLN
2.0000 g | INTRAVENOUS | Status: AC
  Administered 2020-07-16: 2 g via INTRAVENOUS

## 2020-07-16 MED ORDER — CHLORHEXIDINE GLUCONATE 0.12 % MT SOLN: 15.0000 mL | Freq: Once | OROMUCOSAL | Status: AC

## 2020-07-16 MED ORDER — ONDANSETRON HCL 4 MG/2ML IJ SOLN
INTRAMUSCULAR | Status: DC | PRN
  Administered 2020-07-16: 4 mg via INTRAVENOUS

## 2020-07-16 MED ORDER — ONDANSETRON HCL 4 MG/2ML IJ SOLN: 4.0000 mg | Freq: Four times a day (QID) | INTRAMUSCULAR | Status: DC | PRN

## 2020-07-16 MED ORDER — FENTANYL CITRATE (PF) 100 MCG/2ML IJ SOLN
INTRAMUSCULAR | Status: AC
  Filled 2020-07-16: qty 2

## 2020-07-16 MED ORDER — PROPOFOL 10 MG/ML IV BOLUS
INTRAVENOUS | Status: AC
  Filled 2020-07-16: qty 20

## 2020-07-16 MED ORDER — SUCCINYLCHOLINE CHLORIDE 200 MG/10ML IV SOSY
PREFILLED_SYRINGE | INTRAVENOUS | Status: AC
  Filled 2020-07-16: qty 10

## 2020-07-16 MED ORDER — LACTATED RINGERS IV SOLN: INTRAVENOUS | Status: DC

## 2020-07-16 MED ORDER — METOCLOPRAMIDE HCL 10 MG PO TABS: 5.0000 mg | ORAL_TABLET | Freq: Three times a day (TID) | ORAL | Status: DC | PRN

## 2020-07-16 MED ORDER — ORAL CARE MOUTH RINSE: 15.0000 mL | Freq: Once | OROMUCOSAL | Status: AC

## 2020-07-16 MED ORDER — SUCCINYLCHOLINE CHLORIDE 20 MG/ML IJ SOLN
INTRAMUSCULAR | Status: DC | PRN
  Administered 2020-07-16: 100 mg via INTRAVENOUS

## 2020-07-16 MED ORDER — FENTANYL CITRATE (PF) 100 MCG/2ML IJ SOLN
INTRAMUSCULAR | Status: DC | PRN
  Administered 2020-07-16 (×4): 50 ug via INTRAVENOUS

## 2020-07-16 MED ORDER — PROPOFOL 10 MG/ML IV BOLUS
INTRAVENOUS | Status: DC | PRN
  Administered 2020-07-16: 130 mg via INTRAVENOUS
  Administered 2020-07-16: 50 mg via INTRAVENOUS

## 2020-07-16 MED ORDER — FAMOTIDINE 20 MG PO TABS: 20.0000 mg | ORAL_TABLET | Freq: Once | ORAL | Status: AC

## 2020-07-16 MED ORDER — ONDANSETRON HCL 4 MG/2ML IJ SOLN
INTRAMUSCULAR | Status: AC
  Filled 2020-07-16: qty 2

## 2020-07-16 MED ORDER — DEXAMETHASONE SODIUM PHOSPHATE 10 MG/ML IJ SOLN
INTRAMUSCULAR | Status: DC | PRN
  Administered 2020-07-16: 10 mg via INTRAVENOUS

## 2020-07-16 MED ORDER — METOCLOPRAMIDE HCL 5 MG/ML IJ SOLN: 5.0000 mg | Freq: Three times a day (TID) | INTRAMUSCULAR | Status: DC | PRN

## 2020-07-16 MED ORDER — ROCURONIUM BROMIDE 10 MG/ML (PF) SYRINGE
PREFILLED_SYRINGE | INTRAVENOUS | Status: AC
  Filled 2020-07-16: qty 10

## 2020-07-16 MED ORDER — DEXAMETHASONE SODIUM PHOSPHATE 10 MG/ML IJ SOLN
INTRAMUSCULAR | Status: AC
  Filled 2020-07-16: qty 1

## 2020-07-16 MED ORDER — CEFAZOLIN SODIUM-DEXTROSE 2-4 GM/100ML-% IV SOLN
INTRAVENOUS | Status: AC
  Filled 2020-07-16: qty 100

## 2020-07-16 MED ORDER — FENTANYL CITRATE (PF) 100 MCG/2ML IJ SOLN: 25.0000 ug | INTRAMUSCULAR | Status: DC | PRN

## 2020-07-16 MED ORDER — FAMOTIDINE 20 MG PO TABS
ORAL_TABLET | ORAL | Status: AC
  Administered 2020-07-16: 20 mg via ORAL
  Filled 2020-07-16: qty 1

## 2020-07-16 MED ORDER — EPHEDRINE SULFATE 50 MG/ML IJ SOLN
INTRAMUSCULAR | Status: DC | PRN
  Administered 2020-07-16: 10 mg via INTRAVENOUS

## 2020-07-16 MED ORDER — BUPIVACAINE HCL (PF) 0.5 % IJ SOLN
INTRAMUSCULAR | Status: AC
  Filled 2020-07-16: qty 30

## 2020-07-16 MED ORDER — LIDOCAINE HCL (CARDIAC) PF 100 MG/5ML IV SOSY
PREFILLED_SYRINGE | INTRAVENOUS | Status: DC | PRN
  Administered 2020-07-16: 50 mg via INTRAVENOUS

## 2020-07-16 MED ORDER — ONDANSETRON HCL 4 MG/2ML IJ SOLN: 4.0000 mg | Freq: Once | INTRAMUSCULAR | Status: DC | PRN

## 2020-07-16 MED ORDER — GLYCOPYRROLATE 0.2 MG/ML IJ SOLN
INTRAMUSCULAR | Status: AC
  Filled 2020-07-16: qty 1

## 2020-07-16 MED ORDER — LIDOCAINE HCL (PF) 2 % IJ SOLN
INTRAMUSCULAR | Status: AC
  Filled 2020-07-16: qty 5

## 2020-07-16 MED ORDER — ONDANSETRON HCL 4 MG PO TABS: 4.0000 mg | ORAL_TABLET | Freq: Four times a day (QID) | ORAL | Status: DC | PRN

## 2020-07-16 MED ORDER — CHLORHEXIDINE GLUCONATE 0.12 % MT SOLN
OROMUCOSAL | Status: AC
  Administered 2020-07-16: 15 mL via OROMUCOSAL
  Filled 2020-07-16: qty 15

## 2020-07-16 SURGICAL SUPPLY — 40 items
APL PRP STRL LF DISP 70% ISPRP (MISCELLANEOUS) ×1
BNDG COHESIVE 4X5 TAN STRL (GAUZE/BANDAGES/DRESSINGS) ×2 IMPLANT
BNDG ELASTIC 3X5.8 VLCR STR LF (GAUZE/BANDAGES/DRESSINGS) IMPLANT
BNDG ELASTIC 4X5.8 VLCR STR LF (GAUZE/BANDAGES/DRESSINGS) IMPLANT
BNDG ELASTIC 6X5.8 VLCR STR LF (GAUZE/BANDAGES/DRESSINGS) ×2 IMPLANT
BNDG ESMARK 4X12 TAN STRL LF (GAUZE/BANDAGES/DRESSINGS) ×2 IMPLANT
BNDG ESMARK 6X12 TAN STRL LF (GAUZE/BANDAGES/DRESSINGS) ×2 IMPLANT
CANISTER SUCT 1200ML W/VALVE (MISCELLANEOUS) ×2 IMPLANT
CHLORAPREP W/TINT 26 (MISCELLANEOUS) ×2 IMPLANT
CORD BIP STRL DISP 12FT (MISCELLANEOUS) ×2 IMPLANT
COVER WAND RF STERILE (DRAPES) ×2 IMPLANT
CUFF TOURN SGL QUICK 18X4 (TOURNIQUET CUFF) IMPLANT
CUFF TOURN SGL QUICK 30 (TOURNIQUET CUFF) ×2
CUFF TRNQT CYL 30X4X21-28X (TOURNIQUET CUFF) ×1 IMPLANT
DRSG OPSITE POSTOP 3X4 (GAUZE/BANDAGES/DRESSINGS) ×2 IMPLANT
ELECT REM PT RETURN 9FT ADLT (ELECTROSURGICAL) ×2
ELECTRODE REM PT RTRN 9FT ADLT (ELECTROSURGICAL) ×1 IMPLANT
FORCEPS JEWEL BIP 4-3/4 STR (INSTRUMENTS) ×2 IMPLANT
GAUZE SPONGE 4X4 12PLY STRL (GAUZE/BANDAGES/DRESSINGS) IMPLANT
GAUZE XEROFORM 1X8 LF (GAUZE/BANDAGES/DRESSINGS) IMPLANT
GLOVE BIO SURGEON STRL SZ8 (GLOVE) ×4 IMPLANT
GLOVE INDICATOR 8.0 STRL GRN (GLOVE) ×2 IMPLANT
GOWN STRL REUS W/ TWL LRG LVL3 (GOWN DISPOSABLE) ×1 IMPLANT
GOWN STRL REUS W/ TWL XL LVL3 (GOWN DISPOSABLE) ×1 IMPLANT
GOWN STRL REUS W/TWL LRG LVL3 (GOWN DISPOSABLE) ×2
GOWN STRL REUS W/TWL XL LVL3 (GOWN DISPOSABLE) ×2
NEEDLE FILTER BLUNT 18X 1/2SAF (NEEDLE) ×1
NEEDLE FILTER BLUNT 18X1 1/2 (NEEDLE) ×1 IMPLANT
NEEDLE HYPO 21X1.5 SAFETY (NEEDLE) ×2 IMPLANT
NS IRRIG 500ML POUR BTL (IV SOLUTION) ×2 IMPLANT
PACK EXTREMITY (MISCELLANEOUS) ×2 IMPLANT
PAD ABD DERMACEA PRESS 5X9 (GAUZE/BANDAGES/DRESSINGS) ×2 IMPLANT
STOCKINETTE IMPERV 14X48 (MISCELLANEOUS) ×2 IMPLANT
STRIP CLOSURE SKIN 1/4X4 (GAUZE/BANDAGES/DRESSINGS) ×2 IMPLANT
SUT PROLENE 4 0 PS 2 18 (SUTURE) ×2 IMPLANT
SUT VIC AB 3-0 SH 27 (SUTURE) ×2
SUT VIC AB 3-0 SH 27X BRD (SUTURE) ×1 IMPLANT
SWABSTK COMLB BENZOIN TINCTURE (MISCELLANEOUS) ×2 IMPLANT
SYR 20ML LL LF (SYRINGE) ×2 IMPLANT
SYR 5ML LL (SYRINGE) ×2 IMPLANT

## 2020-07-16 NOTE — Op Note (Signed)
07/16/2020  9:14 AM  Patient:   Barbara Blake  Pre-Op Diagnosis:   Benign soft tissue mass, right knee.  Post-Op Diagnosis:   Same  Procedure:   Excision of soft tissue mass right knee.  Surgeon:   Pascal Lux, MD  Anesthesia:   GET  Findings:   As above.  Complications:   None  Fluids:   600 cc crystalloid  EBL:   10 cc  TT:   18 minutes at 300 mmHg  Drains:   None  Closure:   3-0 Vicryl subcuticular sutures  Brief Clinical Note:   The patient is a 64 year old female with a several month history of a painful subcutaneous soft tissue mass over the anterolateral aspect of her right knee.  The symptoms have persisted despite activity modification, icing, etc.  An MRI scan was inconclusive for definitive identification of the mass.  Therefore, she presents at this time for formal excisional biopsy of the subcutaneous right knee mass.  Procedure:   The patient was brought into the operating room and lain in the supine position.  After adequate general endotracheal intubation and anesthesia were obtained, the patient's right lower extremity was prepped with ChloraPrep solution before being draped sterilely.  Preoperative antibiotics were administered.  A timeout was performed to verify the appropriate surgical site before an approximately 2 cm transverse incision was made directly over the mass.  The incision was carried down through the dermis to enter the subcutaneous tissues.  The mass was identified and carefully excised circumferentially, removing some adjacent joining fatty tissue with it to be sure that it was removed entirely.  This mass was then sent to Pathology for definitive identification.  The wound was copiously irrigated with sterile saline solution before the wound was reapproximated using 3-0 Vicryl subcuticular sutures.  Benzoin and Steri-Strips were applied to the skin before a total of 10 cc of 0.5% plain Sensorcaine was injected in and around the incision to  help with postoperative analgesia.  A sterile occlusive dressing was applied over the wound before a compressive wrap was placed around the knee.  The patient was then awakened, extubated, and returned to the recovery room in satisfactory condition after tolerating the procedure well.

## 2020-07-16 NOTE — H&P (Signed)
History of Present Illness:  Barbara Blake is a 64 y.o. female who presents for follow-up of her right knee pain secondary to a small painful soft tissue mass over the anterolateral aspect of the knee. The patient notes little change in her symptoms since she was last seen 5 weeks ago. She continues to have pain with any pressure on the area or if she accidentally bumps it against something. She denies any reinjury to the area, and denies any numbness or paresthesias down her leg to her foot. Since her last visit, she has undergone an MRI scan and presents today to review these results.  Current Outpatient Medications: . ALPRAZolam (XANAX) 0.25 MG tablet TAKE 1 TABLET BY MOUTH TWICE DAILY AS NEEDED  . ergocalciferol, vitamin D2, 50,000 unit capsule Take one capsule once a week for 12 weeks 12 capsule 0  . hydroCHLOROthiazide (HYDRODIURIL) 25 MG tablet Take 1 tablet by mouth once daily  . levothyroxine (SYNTHROID, LEVOTHROID) 175 MCG tablet Take 175 mcg by mouth once daily  . losartan (COZAAR) 100 MG tablet Take 100 mg by mouth once daily   No current Epic-ordered facility-administered medications on file.   Allergies: No Known Allergies  Past Medical History:  . Anxiety  . Asthma without status asthmaticus, unspecified  . HTN (hypertension)  . Hypothyroidism  . Obesity   Past Surgical History:  . APPENDECTOMY  . DILATION AND CURETTAGE, DIAGNOSTIC / THERAPEUTIC  . LIPOMA EXCISION   Family History  . Colon cancer Mother  . Alzheimer's disease Mother  . High blood pressure (Hypertension) Mother  . Cancer Mother  . Cancer Maternal Aunt  . Heart disease Maternal Grandmother  . Lupus Daughter   Social History   Socioeconomic History  . Marital status: Married  Spouse name: Not on file  . Number of children: Not on file  . Years of education: Not on file  . Highest education level: Not on file  Occupational History  . Not on file  Tobacco Use  . Smoking status: Never Smoker   . Smokeless tobacco: Never Used  Substance and Sexual Activity  . Alcohol use: No  Alcohol/week: 0.0 standard drinks  . Drug use: No  . Sexual activity: Defer  Other Topics Concern  . Not on file  Social History Narrative  . Not on file   Social Determinants of Health   Financial Resource Strain:  . Difficulty of Paying Living Expenses:  Food Insecurity:  . Worried About Charity fundraiser in the Last Year:  . Arboriculturist in the Last Year:  Transportation Needs:  . Film/video editor (Medical):  Marland Kitchen Lack of Transportation (Non-Medical):   Review of Systems:  A comprehensive 14 point ROS was performed, reviewed, and the pertinent orthopaedic findings are documented in the HPI.  Physical Exam: Vitals:  07/11/20 1419  BP: 132/80  Weight: (!) 107.5 kg (237 lb)  Height: 157.5 cm (5\' 2" )  PainSc: 0-No pain  PainLoc: Knee   General/Constitutional: Pleasant significantly overweight middle-aged female in no acute distress. Neuro/Psych: Normal mood and affect, oriented to person, place and time.  Right knee exam: GAIT: Mild limp, but uses no assistive devices. ALIGNMENT: Normal SKIN: Approximately 1.5 x 2 cm soft tissue mass in the anterolateral aspect of the knee adjacent to the lateral margin of the patella involving the subcutaneous tissues. SWELLING: Absent EFFUSION: Absent WARMTH: None TENDERNESS: Significant focal tenderness palpation over the mass, otherwise no medial or lateral joint line tenderness. ROM: Full without  pain McMURRAY'S: Negative PATELLOFEMORAL: Normal tracking with no peri-patellar tenderness and negative apprehension sign CREPITUS: Mild patellofemoral crepitus LACHMAN'S: Negative PIVOT SHIFT: Negative ANTERIOR DRAWER: Negative POSTERIOR DRAWER: Negative VARUS/VALGUS: Stable  Right ankle exam: Skin inspection around the right ankle is unremarkable. No swelling, erythema, ecchymosis, abrasions, or other skin abnormalities are identified.  She has moderate focal tenderness palpation over the posterior aspect of the calcaneus at the insertion site of the Achilles tendon. No other areas of tenderness are noted around the foot or ankle. She exhibits essentially full range of motion of the ankle without any pain or catching. Her posterior heel pain is mildly reproduced with maximal dorsiflexion of the ankle. She is neurovascularly intact to the right lower extremity and foot.  X-rays/MRI/Lab data:  A recent MRI scan of the right knee is available for review. By report, the scan demonstrates evidence of moderate degenerative changes involving the lateral patellar facet, as well as mild degenerative changes of the medial compartment. There is no evidence for ligamentous or meniscal pathology. No obvious soft tissue mass is noted in the subcutaneous tissues in the anterior aspect the knee, although the radiologist notes that the study was inadequate to fully assess the soft tissues in this area. Both the films and report were reviewed by myself and discussed with the patient and her husband.  Assessment: . Benign neoplasm of soft tissue of knee . Primary osteoarthritis of right knee   Plan: The treatment options were discussed with the patient and her husband. In addition, patient educational materials were provided regarding the diagnosis and treatment options. Regarding her right knee symptoms, the patient is quite frustrated by the persistence of the painful soft tissue mass and would like to consider alternative treatment options. Therefore, I have recommended a surgical procedure, specifically an excision of the soft tissue mass. The procedure was discussed with the patient, as were the potential risks (including bleeding, infection, nerve and/or blood vessel injury, persistent or recurrent pain, recurrence of the mass, need for further surgery, blood clots, strokes, heart attacks and/or arhythmias, pneumonia, etc.) and benefits. The patient  states his/her understanding and wishes to proceed. All of the patient's questions and concerns were answered. She can call any time with further concerns. She will follow up post-surgery, routine.    H&P reviewed and patient re-examined. No changes.

## 2020-07-16 NOTE — Progress Notes (Addendum)
   07/16/20 0745  Clinical Encounter Type  Visited With Patient  Visit Type Initial  Referral From Chaplain  Consult/Referral To Chaplain  While rounding in [facility] chaplain commented on patient hair. While standing near the room someone else commented on patient's hair. Chaplain went the room and briefly visited with patient. Chaplain wished patient well and she said thank you for the prayer.

## 2020-07-16 NOTE — Anesthesia Postprocedure Evaluation (Signed)
Anesthesia Post Note  Patient: Barbara Blake  Procedure(s) Performed: EXCISION OF A SUBCUTANEOUS SOFT TISSUE MASS IN THE ANTERIOR ASPECT OF RIGHT KNEE. (Right Knee)  Patient location during evaluation: PACU Anesthesia Type: General Level of consciousness: awake and alert and oriented Pain management: pain level controlled Vital Signs Assessment: post-procedure vital signs reviewed and stable Respiratory status: spontaneous breathing Cardiovascular status: blood pressure returned to baseline Anesthetic complications: no   No complications documented.   Last Vitals:  Vitals:   07/16/20 1002 07/16/20 1008  BP: 115/73 (!) 114/58  Pulse: 62 66  Resp: 18 16  Temp: (!) 36.3 C 36.6 C  SpO2: 95% 100%    Last Pain:  Vitals:   07/16/20 1008  TempSrc: Temporal  PainSc: 0-No pain                 Vihana Kydd

## 2020-07-16 NOTE — Anesthesia Preprocedure Evaluation (Signed)
Anesthesia Evaluation  Patient identified by MRN, date of birth, ID band Patient awake    Reviewed: Allergy & Precautions, NPO status , Patient's Chart, lab work & pertinent test results  Airway Mallampati: III       Dental   Pulmonary asthma ,    Pulmonary exam normal        Cardiovascular hypertension, Normal cardiovascular exam     Neuro/Psych Anxiety  Neuromuscular disease    GI/Hepatic GERD  ,  Endo/Other  Hypothyroidism Morbid obesity  Renal/GU      Musculoskeletal negative musculoskeletal ROS (+)   Abdominal (+) + obese,   Peds negative pediatric ROS (+)  Hematology negative hematology ROS (+)   Anesthesia Other Findings   Reproductive/Obstetrics                             Anesthesia Physical Anesthesia Plan  ASA: II  Anesthesia Plan: General   Post-op Pain Management:    Induction: Intravenous  PONV Risk Score and Plan:   Airway Management Planned: Oral ETT  Additional Equipment:   Intra-op Plan:   Post-operative Plan: Extubation in OR  Informed Consent: I have reviewed the patients History and Physical, chart, labs and discussed the procedure including the risks, benefits and alternatives for the proposed anesthesia with the patient or authorized representative who has indicated his/her understanding and acceptance.     Dental advisory given  Plan Discussed with: CRNA and Surgeon  Anesthesia Plan Comments:         Anesthesia Quick Evaluation

## 2020-07-16 NOTE — Transfer of Care (Signed)
Immediate Anesthesia Transfer of Care Note  Patient: Barbara Blake  Procedure(s) Performed: EXCISION OF A SUBCUTANEOUS SOFT TISSUE MASS IN THE ANTERIOR ASPECT OF RIGHT KNEE. (Right Knee)  Patient Location: PACU  Anesthesia Type:General  Level of Consciousness: awake  Airway & Oxygen Therapy: Patient Spontanous Breathing and Patient connected to face mask oxygen  Post-op Assessment: Report given to RN and Post -op Vital signs reviewed and stable  Post vital signs: Reviewed  Last Vitals:  Vitals Value Taken Time  BP 132/81 07/16/20 0917  Temp    Pulse 87 07/16/20 0918  Resp 22 07/16/20 0918  SpO2 100 % 07/16/20 0918  Vitals shown include unvalidated device data.  Last Pain:  Vitals:   07/16/20 0725  TempSrc: Tympanic  PainSc: 0-No pain      Patients Stated Pain Goal: 0 (37/62/83 1517)  Complications: No complications documented.

## 2020-07-16 NOTE — Discharge Instructions (Addendum)
Orthopedic discharge instructions: Keep dressing dry and intact. Keep right leg elevated. May shower over op-site dressing after ace wrap removed tomorrow (Thursday). Leave op-site dressing intact. Apply ice to affected area frequently. Take Aleve 2 tabs BID OR ibuprofen 600-800 mg TID with meals for 5-7 days, then as necessary.   BID = 2 times per day (every 12 hours)        TID = 3 times per day (every 8 hours) Take ES Tylenol when needed.  Return for follow-up in 10-14 days or as scheduled.   AMBULATORY SURGERY  DISCHARGE INSTRUCTIONS   1) The drugs that you were given will stay in your system until tomorrow so for the next 24 hours you should not:  A) Drive an automobile B) Make any legal decisions C) Drink any alcoholic beverage   2) You may resume regular meals tomorrow.  Today it is better to start with liquids and gradually work up to solid foods.  You may eat anything you prefer, but it is better to start with liquids, then soup and crackers, and gradually work up to solid foods.   3) Please notify your doctor immediately if you have any unusual bleeding, trouble breathing, redness and pain at the surgery site, drainage, fever, or pain not relieved by medication.    4) Additional Instructions:        Please contact your physician with any problems or Same Day Surgery at [phone], Monday through Friday 6 am to 4 pm, or [place] at [facility] number at [phone].

## 2020-07-16 NOTE — Anesthesia Procedure Notes (Signed)
Procedure Name: Intubation Performed by: Rolla Plate, CRNA Pre-anesthesia Checklist: Patient identified, Patient being monitored, Timeout performed, Emergency Drugs available and Suction available Patient Re-evaluated:Patient Re-evaluated prior to induction Oxygen Delivery Method: Circle system utilized Preoxygenation: Pre-oxygenation with 100% oxygen Induction Type: IV induction and Rapid sequence Laryngoscope Size: 3 and McGraph Grade View: Grade I Tube type: Oral Tube size: 6.5 mm Number of attempts: 1 Airway Equipment and Method: Stylet and Video-laryngoscopy Placement Confirmation: ETT inserted through vocal cords under direct vision,  positive ETCO2 and breath sounds checked- equal and bilateral Secured at: 20 cm Tube secured with: Tape Dental Injury: Teeth and Oropharynx as per pre-operative assessment

## 2020-07-17 ENCOUNTER — Encounter: Payer: Self-pay | Admitting: Surgery

## 2020-07-23 ENCOUNTER — Other Ambulatory Visit: Payer: Self-pay | Admitting: Internal Medicine

## 2020-07-23 DIAGNOSIS — F419 Anxiety disorder, unspecified: Secondary | ICD-10-CM

## 2020-07-23 LAB — SURGICAL PATHOLOGY

## 2020-07-23 MED ORDER — ALPRAZOLAM 0.25 MG PO TABS: 0.1250 mg | ORAL_TABLET | Freq: Every day | ORAL | 2 refills | Status: DC | PRN

## 2020-08-01 ENCOUNTER — Other Ambulatory Visit: Payer: Self-pay | Admitting: Internal Medicine

## 2020-08-01 DIAGNOSIS — E039 Hypothyroidism, unspecified: Secondary | ICD-10-CM

## 2020-08-01 MED ORDER — LEVOTHYROXINE SODIUM 175 MCG PO TABS: 175.0000 ug | ORAL_TABLET | Freq: Every day | ORAL | 3 refills | Status: DC

## 2020-08-08 ENCOUNTER — Other Ambulatory Visit: Payer: Self-pay | Admitting: Internal Medicine

## 2020-08-08 ENCOUNTER — Telehealth: Payer: Self-pay | Admitting: Internal Medicine

## 2020-08-08 DIAGNOSIS — I10 Essential (primary) hypertension: Secondary | ICD-10-CM

## 2020-08-08 MED ORDER — LOSARTAN POTASSIUM 100 MG PO TABS: 100.0000 mg | ORAL_TABLET | Freq: Every day | ORAL | 1 refills | Status: DC

## 2020-08-08 NOTE — Telephone Encounter (Signed)
Pt needs a refill on losartan (COZAAR) 100 MG tablet ASAP. Pt states that pharmacy has been trying to get a refill for a week and pt is out of medication.

## 2020-09-09 ENCOUNTER — Telehealth: Payer: Self-pay | Admitting: Internal Medicine

## 2020-09-09 NOTE — Telephone Encounter (Signed)
[facility] Patient foot pain in 06/2020 office note. Please advise

## 2020-09-09 NOTE — Telephone Encounter (Signed)
Patient called in want Dr.Tracy to refer her to a podiatrist

## 2020-09-15 NOTE — Addendum Note (Signed)
Addended by: Orland Mustard on: 09/15/2020 09:39 AM   Modules accepted: Orders

## 2020-09-15 NOTE — Telephone Encounter (Signed)
Good morning! ° °Received and sent. °

## 2020-09-15 NOTE — Telephone Encounter (Signed)
Referral sent [facility] Drs. Evans/Patel/Hyatt

## 2020-09-24 ENCOUNTER — Telehealth: Payer: Self-pay

## 2020-09-24 NOTE — Telephone Encounter (Signed)
LVM for pt to call back to schedule NP appt. Referral number [id number]

## 2020-09-24 NOTE — Telephone Encounter (Signed)
Do we need to do anything to change who it was sent to?

## 2020-09-24 NOTE — Telephone Encounter (Addendum)
Patient called in requested to go to [facility] for her foot he is in [phone]

## 2020-09-25 NOTE — Telephone Encounter (Signed)
Left message informing the Patient referral sent and that I would also send mychart message.   Sent. Okay to inform Patient of this if calling back in .

## 2020-09-25 NOTE — Telephone Encounter (Signed)
Good morning!  No its ok. Sent to [facility] Dr Vickki Muff.

## 2020-10-02 ENCOUNTER — Other Ambulatory Visit: Payer: Self-pay | Admitting: Podiatry

## 2020-10-09 ENCOUNTER — Ambulatory Visit: Payer: No Typology Code available for payment source

## 2020-10-14 ENCOUNTER — Ambulatory Visit: Payer: No Typology Code available for payment source

## 2020-10-15 NOTE — Telephone Encounter (Signed)
Pt seen at [facility] 10/02/2020. Will fu with pt to close the referral gap.

## 2020-10-16 ENCOUNTER — Other Ambulatory Visit: Payer: Self-pay

## 2020-10-16 ENCOUNTER — Ambulatory Visit: Payer: No Typology Code available for payment source | Attending: Podiatry

## 2020-10-16 DIAGNOSIS — M25571 Pain in right ankle and joints of right foot: Secondary | ICD-10-CM | POA: Diagnosis not present

## 2020-10-16 DIAGNOSIS — M6281 Muscle weakness (generalized): Secondary | ICD-10-CM | POA: Insufficient documentation

## 2020-10-16 NOTE — Patient Instructions (Signed)
ccess Code: [facility] URL: [email or web address] Date: 10/16/2020 Prepared by: Janna Arch  Exercises Seated Calf Stretch with Strap - 1 x daily - 7 x weekly - 2 sets - 2 reps - 30 hold Standing Gastroc Stretch on Step with Counter Support - 1 x daily - 7 x weekly - 2 sets - 2 reps - 30 hold Seated Ankle Alphabet - 1 x daily - 7 x weekly - 2 sets - 1 reps - 5 hold

## 2020-10-16 NOTE — Therapy (Signed)
[place] [facility] SERVICES [address] [place], [place], [address] Phone: [phone]   Fax:  [phone]  Physical Therapy Evaluation  Patient Details  Name: Barbara Blake MRN: [id number] Date of Birth: July 15, 1956 Referring Provider (PT): Samara Deist DPm   Encounter Date: 10/16/2020   PT End of Session - 10/16/20 1716    Visit Number 1    Number of Visits 4    Date for PT Re-Evaluation 11/13/20    Authorization Type 1/10 eval 11/4    PT Start Time 1537    PT Stop Time 1620    PT Time Calculation (min) 43 min    Activity Tolerance Patient tolerated treatment well    Behavior During Therapy [facility] for tasks assessed/performed           Past Medical History:  Diagnosis Date  . Anxiety   . Asthma   . Gallstones   . History of kidney stones   . HTN (hypertension)   . Hypothyroidism   . Low back pain   . Lung nodule   . Obesity   . Vitamin D deficiency     Past Surgical History:  Procedure Laterality Date  . APPENDECTOMY    . DILATION AND CURETTAGE OF UTERUS    . EXTRACORPOREAL SHOCK WAVE LITHOTRIPSY Left 06/22/2018   Procedure: EXTRACORPOREAL SHOCK WAVE LITHOTRIPSY (ESWL);  Surgeon: Abbie Sons, MD;  Location: [facility] ORS;  Service: Urology;  Laterality: Left;  . LIPOMA EXCISION    . MASS EXCISION Right 07/16/2020   Procedure: EXCISION OF A SUBCUTANEOUS SOFT TISSUE MASS IN THE ANTERIOR ASPECT OF RIGHT KNEE.;  Surgeon: Corky Mull, MD;  Location: [facility] ORS;  Service: Orthopedics;  Laterality: Right;    There were no vitals filed for this visit.    Subjective Assessment - 10/16/20 1555    Subjective Patient is a pleasant 64 year old female who presents for achilles tendinitis of RLE.    Pertinent History Patient is a 64 year old female with achilles tendonitis for past 8 months. It got worse over time so she went to the doctor, took steroids for it which just finished last week and her pain levels are improving but  still present daily. Works [place] includes excision of soft tissue mass (R knee 07/16/20), anxiety, asthma, HTN, hypothyroidism, obesity. Has been attempting everything she can think of for pain reduction including: orthotics, volteron cream, icing, stretching. 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    Limitations Sitting;Standing;Walking;House hold activities    How long can you sit comfortably? half hour    How long can you stand comfortably? not since steroids but is initally painful upon standing after sitting.    How long can you walk comfortably? only after prolonged sitting.    Diagnostic tests x ray: cleared    Patient Stated Goals to stop being painful    Currently in Pain? Yes    Pain Score 2     Pain Location Heel    Pain Orientation Right;Posterior    Pain Descriptors / Indicators Aching;Tender    Pain Type Acute pain    Pain Radiating Towards achilles insertion    Pain Onset More than a month ago    Pain Frequency Intermittent    Aggravating Factors  prolonged sitting then having to move    Pain Relieving Factors moving foot, moving body    Effect of Pain on Daily Activities affects sleep schedule, ability to  sit, ability to walk, work              [facility] PT Assessment - 10/16/20 0001      Assessment   Medical Diagnosis achilles tendinitis    Referring Provider (PT) Samara Deist DPm    Onset Date/Surgical Date --   approximately 8 months ago   Prior Therapy n/a      Precautions   Precautions None    Required Braces or Orthoses --   has a boot for sleeping in     Restrictions   Weight Bearing Restrictions No      Balance Screen   Has the patient fallen in the past 6 months No    Has the patient had a decrease in activity level because of a fear of falling?  Yes    Is the patient reluctant to leave their home because of a fear of falling?  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     Leisure play with new puppy       Cognition   Overall Cognitive Status Within Functional Limits for tasks assessed      Observation/Other Assessments   Focus on Therapeutic Outcomes (FOTO)  risk adjusted 46%; normal 86%             PAIN: Only after sitting for a long time,  Worst pain: 9/10 Best 0/10   POSTURE: Standing: slight weight shift onto LLE  PROM/AROM:  PROM BLE:  R ankle: DF: 11, PF, eversion, inversion: WFL AROM BLE:   Df: 10 PF 38, eversion inversion WFL   Accessory Motions:  Subtalar distraction: slight fluid noted empty Talocrural ventral glide: hypomobile Subtalar: medial/lateral glide hypomobile with slight fluid noted.  MTP: WFL   STRENGTH:  Graded on a 0-5 scale Muscle Group Left Right  eversion 5/5 4-/5  inversion 5/5 4-/5  Ankle DF 5/5 3+/5  Ankle PF 5/5 3+/5   SENSATION:   BLE :   NEUROLOGICAL SCREEN: (2+ unless otherwise noted.) N=normal  Ab=abnormal   Level Dermatome R L  C3 Anterior Neck  N N  C4 Top of Shoulder N N  C5 Lateral Upper Arm  N N  C6 Lateral Arm/ Thumb  N N  C7 Middle Finger  N N  C8 4th & 5th Finger N N  T1 Medial Arm N N  L2 Medial thigh/groin N N  L3 Lower thigh/med.knee N N  L4 Medial leg/lat thigh N N  L5 Lat. leg & dorsal foot N N  S1 post/lat foot/thigh/leg N N  S2 Post./med. thigh & leg N N    SOMATOSENSORY:  Any N & T in extremities or weakness: reports :         Sensation           Intact      Diminished         Absent  Light touch LEs                                SPECIAL TESTS: -thompson test/calf squeeze  +palpation for tenderness test -matles test -  palpation of gap: no gap palpated Observed: thickened achilles tendon RLE  Palpation + for pain at insertion site of RLE   FUNCTIONAL MOBILITY: STS:  slight weight shift onto LLE, able to perform without deficit, only pain   GAIT: Decreased heel strike RLE, slightly decreased weight shift and toe off of RLE  OUTCOME MEASURES: TEST Outcome Interpretation  FOTO Risk adjusted 46%, normal 86.4 % Discharge predicted score 82%     Access Code: [place] URL: [email or web address] Date: 10/16/2020 Prepared by: Janna Arch  Exercises Seated Calf Stretch with Strap - 1 x daily - 7 x weekly - 2 sets - 2 reps - 30 hold Standing Gastroc Stretch on Step with Counter Support - 1 x daily - 7 x weekly - 2 sets - 2 reps - 30 hold Seated Ankle Alphabet - 1 x daily - 7 x weekly - 2 sets - 1 reps - 5 hold   Treatment: STM with implementation of cross friction massage to insertion of achilles x8 minutes.      Objective measurements completed on examination: See above findings.               PT Education - 10/16/20 1655    Education Details POC, goals, HEP    Person(s) Educated Patient    Methods Explanation;Demonstration;Tactile cues;Verbal cues;Handout    Comprehension Verbalized understanding;Returned demonstration;Verbal cues required;Tactile cues required            PT Short Term Goals - 10/16/20 1739      PT SHORT TERM GOAL #1   Title Patient will be independent in home exercise program to improve strength/mobility for better functional independence with ADLs.    Baseline 11/4 : HEP given    Time 2    Period Weeks    Status New    Target Date 10/30/20             PT Long Term Goals - 10/16/20 1743      PT LONG TERM GOAL #1   Title Patient will increase FOTO score to equal to or greater than  82%  to demonstrate statistically significant improvement in mobility and quality of life.    Baseline 11/4: risk adjusted 46%, normal 86    Time 4    Period Weeks    Status New    Target Date 11/13/20      PT LONG TERM GOAL #2   Title Patient will increase R foot strength to > 4+/5 for return to PLOF  and to normalize gait mechanics    Baseline 11/4: see note    Time 4    Period Weeks    Status New    Target Date 11/13/20      PT LONG TERM GOAL #3   Title Patient will report a worst pain of 3/10 on VAS in R achilles  to improve tolerance with ADLs and reduced symptoms with activities.    Baseline 11/4: 9/10    Time 4    Period Weeks    Status New    Target Date 11/13/20                  Plan - 10/16/20 1730    Clinical Impression Statement Patient is a very pleasant 64 year old female who presents for R achilles tendinitis. Patient is experiencing pain daily with worsened pain after prolonged sitting. Recent steroid taper reduced pain however is still occurring. Patient has weakness of R foot compared to L however ROM is  functional. Weight shift is limited to R initially for first two or three steps but improves with prolonged walking. Patient would benefit from skilled physical therapy to decrease pain levels, frequency, and improve mobility for return to PLOF and quality of life.    Personal Factors and Comorbidities Comorbidity 3+;Fitness;Past/Current Experience;Time since onset of injury/illness/exacerbation;Profession    Comorbidities anxiety, asthma, HTN, hypothyroidism, obesity    Examination-Activity Limitations Sleep;Sit;Locomotion Level;Squat;Stairs;Stand    Examination-Participation Restrictions Cleaning;Community Activity;Driving;Occupation;Laundry;Volunteer;Yard Work;Shop    Stability/Clinical Decision Making Evolving/Moderate complexity    Clinical Decision Making Moderate    Rehab Potential Good    PT Frequency 1x / week    PT Duration 4 weeks    PT Treatment/Interventions ADLs/Self Care Home Management;Biofeedback;Cryotherapy;Electrical Stimulation;Iontophoresis 4mg /ml Dexamethasone;Moist Heat;Ultrasound;Functional mobility training;Stair training;Gait training;DME Instruction;Therapeutic activities;Therapeutic exercise;Balance training;Neuromuscular  re-education;Orthotic Fit/Training;Patient/family education;Manual techniques;Compression bandaging;Taping;Splinting;Energy conservation;Dry needling;Passive range of motion;Visual/perceptual remediation/compensation    PT Next Visit Plan STM with metal tool to achilles tendon, stretch, ice cup massage    PT Home Exercise Plan see above    Consulted and Agree with Plan of Care Patient           Patient will benefit from skilled therapeutic intervention in order to improve the following deficits and impairments:  Abnormal gait, Decreased activity tolerance, Decreased range of motion, Decreased mobility, Decreased strength, Difficulty walking, Increased fascial restricitons, Impaired flexibility, Impaired perceived functional ability, Increased muscle spasms, Improper body mechanics, Pain, Obesity  Visit Diagnosis: Pain in right ankle and joints of right foot  Muscle weakness (generalized)     Problem List Patient Active Problem List   Diagnosis Date Noted  . Morbid obesity with BMI of 40.0-44.9, adult ([place]) 05/01/2020  . Prediabetes 01/11/2020  . Allergic rhinitis 11/23/2019  . Colon polyps 11/17/2018  . Annual physical exam 11/17/2018  . Pulmonary nodules 06/02/2018  . Kidney stones 05/29/2018  . Fatty liver 01/27/2018  . HLD (hyperlipidemia) 12/16/2017  . Midline low back pain with bilateral sciatica 11/28/2017  . Asthma, well controlled 11/28/2017  . Gastroesophageal reflux disease 11/28/2017  . Postnasal drip 11/28/2017  . Hoarseness of voice 11/28/2017  . Asthma, mild intermittent 06/30/2016  . Positive ANA (antinuclear antibody) 02/20/2016  . Arthralgia 02/20/2016  . Vitamin D deficiency 02/20/2016  . Cholelithiasis 04/25/2014  . Severe obesity (BMI >= 40) ([place]) 04/25/2014  . Routine general medical examination at a health care facility 04/04/2014  . Left sided sciatica 04/04/2014  . Screen for colon cancer 04/04/2014  . Hypertension 11/19/2011  . Hypothyroidism  11/19/2011  . Anxiety 11/19/2011   Janna Arch, PT, DPT   10/16/2020, 5:47 PM  [place] [facility] SERVICES [address] [place], [place], [address] Phone: [phone]   Fax:  [phone]  Name: SHERILYN WINDHORST MRN: [id number] Date of Birth: Jul 09, 1956

## 2020-10-20 ENCOUNTER — Ambulatory Visit: Payer: No Typology Code available for payment source

## 2020-10-20 ENCOUNTER — Other Ambulatory Visit: Payer: Self-pay

## 2020-10-20 DIAGNOSIS — M25571 Pain in right ankle and joints of right foot: Secondary | ICD-10-CM

## 2020-10-20 DIAGNOSIS — M6281 Muscle weakness (generalized): Secondary | ICD-10-CM

## 2020-10-20 NOTE — Therapy (Signed)
[place] MAIN [facility] SERVICES [address] [place], [place], [address] Phone: [phone]   Fax:  [phone]  Physical Therapy Treatment  Patient Details  Name: Barbara Blake MRN: [id number] Date of Birth: 31-Jul-1956 Referring Provider (PT): Samara Deist DPm   Encounter Date: 10/20/2020   PT End of Session - 10/20/20 1638    Visit Number 2    Number of Visits 4    Date for PT Re-Evaluation 11/13/20    Authorization Type 2/10 eval 11/4    PT Start Time 1640    PT Stop Time 1723    PT Time Calculation (min) 43 min    Activity Tolerance Patient tolerated treatment well    Behavior During Therapy [facility] for tasks assessed/performed           Past Medical History:  Diagnosis Date  . Anxiety   . Asthma   . Gallstones   . History of kidney stones   . HTN (hypertension)   . Hypothyroidism   . Low back pain   . Lung nodule   . Obesity   . Vitamin D deficiency     Past Surgical History:  Procedure Laterality Date  . APPENDECTOMY    . DILATION AND CURETTAGE OF UTERUS    . EXTRACORPOREAL SHOCK WAVE LITHOTRIPSY Left 06/22/2018   Procedure: EXTRACORPOREAL SHOCK WAVE LITHOTRIPSY (ESWL);  Surgeon: Abbie Sons, MD;  Location: [facility] ORS;  Service: Urology;  Laterality: Left;  . LIPOMA EXCISION    . MASS EXCISION Right 07/16/2020   Procedure: EXCISION OF A SUBCUTANEOUS SOFT TISSUE MASS IN THE ANTERIOR ASPECT OF RIGHT KNEE.;  Surgeon: Corky Mull, MD;  Location: [facility] ORS;  Service: Orthopedics;  Laterality: Right;    There were no vitals filed for this visit.   Subjective Assessment - 10/20/20 1727    Subjective Patient presents to physical therapy with husband. Has been compliant with HEP. Reports pain is improving.    Pertinent History Patient is a 64 year old female with achilles tendonitis for past 8 months. It got worse over time so she went to the doctor, took steroids for it which just finished last week and her pain levels are  improving but still present daily. Works [place] includes excision of soft tissue mass (R knee 07/16/20), anxiety, asthma, HTN, hypothyroidism, obesity. Has been attempting everything she can think of for pain reduction including: orthotics, volteron cream, icing, stretching. 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    Limitations Sitting;Standing;Walking;House hold activities    How long can you sit comfortably? half hour    How long can you stand comfortably? not since steroids but is initally painful upon standing after sitting.    How long can you walk comfortably? only after prolonged sitting.    Diagnostic tests x ray: cleared    Patient Stated Goals to stop being painful    Currently in Pain? Yes    Pain Score 1     Pain Location Heel    Pain Orientation Right;Posterior    Pain Descriptors / Indicators Aching    Pain Type Acute pain    Pain Onset More than a month ago    Pain Frequency Intermittent             Manual:  Long sitting:   STM with metal tool to R achilles tendon for IASTM x 8 minutes with hook tool.  STM to R calf with implementation of effleurage and  ptrissage for muscle tissue tension reduction and trigger point reduction x 13 minutes Dorsiflexion stretch in long sitting with PT overpressure 4x 30 seconds ice cup massage x 3 minutes to R achilles.   Therex: prostretch pf/df 5x 20 second holds Dynadisc: RLE: df/pf 10x, inv/eversion 10, clockwise 10x, counterclockwise 10x       Pt educated throughout session about proper posture and technique with exercises. Improved exercise technique, movement at target joints, use of target muscles after min to mod verbal, visual, tactile cues    Patient presents with excellent motivation throughout physical therapy session. Progressive lengthening and soft tissue muscle tension reduction in combination with unweighted strengthening performed with patient tolerating well. Patient would benefit from skilled physical  therapy to decrease pain levels, frequency, and improve mobility for return to PLOF and quality of life.                  PT Education - 10/20/20 1637    Education Details exercise technique, manual    Person(s) Educated Patient    Methods Explanation;Demonstration;Tactile cues;Verbal cues    Comprehension Verbalized understanding;Returned demonstration;Verbal cues required;Tactile cues required            PT Short Term Goals - 10/16/20 1739      PT SHORT TERM GOAL #1   Title Patient will be independent in home exercise program to improve strength/mobility for better functional independence with ADLs.    Baseline 11/4 : HEP given    Time 2    Period Weeks    Status New    Target Date 10/30/20             PT Long Term Goals - 10/16/20 1743      PT LONG TERM GOAL #1   Title Patient will increase FOTO score to equal to or greater than  82%  to demonstrate statistically significant improvement in mobility and quality of life.    Baseline 11/4: risk adjusted 46%, normal 86    Time 4    Period Weeks    Status New    Target Date 11/13/20      PT LONG TERM GOAL #2   Title Patient will increase R foot strength to > 4+/5 for return to PLOF and to normalize gait mechanics    Baseline 11/4: see note    Time 4    Period Weeks    Status New    Target Date 11/13/20      PT LONG TERM GOAL #3   Title Patient will report a worst pain of 3/10 on VAS in R achilles  to improve tolerance with ADLs and reduced symptoms with activities.    Baseline 11/4: 9/10    Time 4    Period Weeks    Status New    Target Date 11/13/20                 Plan - 10/20/20 1731    Clinical Impression Statement Patient presents with excellent motivation throughout physical therapy session. Progressive lengthening and soft tissue muscle tension reduction in combination with unweighted strengthening performed with patient tolerating well. Patient would benefit from skilled physical  therapy to decrease pain levels, frequency, and improve mobility for return to PLOF and quality of life.    Personal Factors and Comorbidities Comorbidity 3+;Fitness;Past/Current Experience;Time since onset of injury/illness/exacerbation;Profession    Comorbidities anxiety, asthma, HTN, hypothyroidism, obesity    Examination-Activity Limitations Sleep;Sit;Locomotion Level;Squat;Stairs;Stand    Examination-Participation Restrictions Cleaning;Community Activity;Driving;Occupation;Laundry;Volunteer;Yard Work;Shop  Stability/Clinical Decision Making Evolving/Moderate complexity    Rehab Potential Good    PT Frequency 1x / week    PT Duration 4 weeks    PT Treatment/Interventions ADLs/Self Care Home Management;Biofeedback;Cryotherapy;Electrical Stimulation;Iontophoresis 4mg /ml Dexamethasone;Moist Heat;Ultrasound;Functional mobility training;Stair training;Gait training;DME Instruction;Therapeutic activities;Therapeutic exercise;Balance training;Neuromuscular re-education;Orthotic Fit/Training;Patient/family education;Manual techniques;Compression bandaging;Taping;Splinting;Energy conservation;Dry needling;Passive range of motion;Visual/perceptual remediation/compensation    PT Next Visit Plan STM with metal tool to achilles tendon, stretch, ice cup massage    PT Home Exercise Plan see above    Consulted and Agree with Plan of Care Patient           Patient will benefit from skilled therapeutic intervention in order to improve the following deficits and impairments:  Abnormal gait, Decreased activity tolerance, Decreased range of motion, Decreased mobility, Decreased strength, Difficulty walking, Increased fascial restricitons, Impaired flexibility, Impaired perceived functional ability, Increased muscle spasms, Improper body mechanics, Pain, Obesity  Visit Diagnosis: Pain in right ankle and joints of right foot  Muscle weakness (generalized)     Problem List Patient Active Problem List    Diagnosis Date Noted  . Morbid obesity with BMI of 40.0-44.9, adult ([place]) 05/01/2020  . Prediabetes 01/11/2020  . Allergic rhinitis 11/23/2019  . Colon polyps 11/17/2018  . Annual physical exam 11/17/2018  . Pulmonary nodules 06/02/2018  . Kidney stones 05/29/2018  . Fatty liver 01/27/2018  . HLD (hyperlipidemia) 12/16/2017  . Midline low back pain with bilateral sciatica 11/28/2017  . Asthma, well controlled 11/28/2017  . Gastroesophageal reflux disease 11/28/2017  . Postnasal drip 11/28/2017  . Hoarseness of voice 11/28/2017  . Asthma, mild intermittent 06/30/2016  . Positive ANA (antinuclear antibody) 02/20/2016  . Arthralgia 02/20/2016  . Vitamin D deficiency 02/20/2016  . Cholelithiasis 04/25/2014  . Severe obesity (BMI >= 40) ([place]) 04/25/2014  . Routine general medical examination at a health care facility 04/04/2014  . Left sided sciatica 04/04/2014  . Screen for colon cancer 04/04/2014  . Hypertension 11/19/2011  . Hypothyroidism 11/19/2011  . Anxiety 11/19/2011   Janna Arch, PT, DPT   10/20/2020, 5:32 PM  [place] MAIN [facility] SERVICES [address] [place], [place], [address] Phone: [phone]   Fax:  [phone]  Name: Barbara Blake MRN: [id number] Date of Birth: 11/26/1956

## 2020-10-22 ENCOUNTER — Ambulatory Visit: Payer: No Typology Code available for payment source

## 2020-10-22 ENCOUNTER — Other Ambulatory Visit: Payer: Self-pay

## 2020-10-22 ENCOUNTER — Ambulatory Visit
Admission: RE | Admit: 2020-10-22 | Discharge: 2020-10-22 | Disposition: A | Discharge status: Home / Self Care | Payer: No Typology Code available for payment source | Source: Ambulatory Visit | Attending: Internal Medicine | Admitting: Internal Medicine

## 2020-10-22 DIAGNOSIS — Z1231 Encounter for screening mammogram for malignant neoplasm of breast: Secondary | ICD-10-CM | POA: Insufficient documentation

## 2020-10-27 ENCOUNTER — Other Ambulatory Visit: Payer: Self-pay

## 2020-10-27 ENCOUNTER — Ambulatory Visit: Payer: No Typology Code available for payment source

## 2020-10-27 DIAGNOSIS — M25571 Pain in right ankle and joints of right foot: Secondary | ICD-10-CM | POA: Diagnosis not present

## 2020-10-27 DIAGNOSIS — M6281 Muscle weakness (generalized): Secondary | ICD-10-CM

## 2020-10-27 NOTE — Therapy (Signed)
[place] MAIN [facility] SERVICES [address] [place], [place], [address] Phone: [phone]   Fax:  [phone]  Physical Therapy Treatment  Patient Details  Name: Barbara Blake MRN: [id number] Date of Birth: 08-Mar-1956 Referring Provider (PT): Samara Deist DPm   Encounter Date: 10/27/2020   PT End of Session - 10/28/20 0959    Visit Number 3    Number of Visits 4    Date for PT Re-Evaluation 11/13/20    Authorization Type 3/10 eval 11/4    PT Start Time 1600    PT Stop Time 1644    PT Time Calculation (min) 44 min    Activity Tolerance Patient tolerated treatment well    Behavior During Therapy [facility] for tasks assessed/performed           Past Medical History:  Diagnosis Date  . Anxiety   . Asthma   . Gallstones   . History of kidney stones   . HTN (hypertension)   . Hypothyroidism   . Low back pain   . Lung nodule   . Obesity   . Vitamin D deficiency     Past Surgical History:  Procedure Laterality Date  . APPENDECTOMY    . DILATION AND CURETTAGE OF UTERUS    . EXTRACORPOREAL SHOCK WAVE LITHOTRIPSY Left 06/22/2018   Procedure: EXTRACORPOREAL SHOCK WAVE LITHOTRIPSY (ESWL);  Surgeon: Abbie Sons, MD;  Location: [facility] ORS;  Service: Urology;  Laterality: Left;  . LIPOMA EXCISION    . MASS EXCISION Right 07/16/2020   Procedure: EXCISION OF A SUBCUTANEOUS SOFT TISSUE MASS IN THE ANTERIOR ASPECT OF RIGHT KNEE.;  Surgeon: Corky Mull, MD;  Location: [facility] ORS;  Service: Orthopedics;  Laterality: Right;    There were no vitals filed for this visit.   Subjective Assessment - 10/27/20 1707    Subjective Patient reports she over did it on saturday decorating for christmas, had pain but was able to stretch and rest to relieve it the next day. Is still a little sore from it.    Pertinent History Patient is a 64 year old female with achilles tendonitis for past 8 months. It got worse over time so she went to the doctor, took  steroids for it which just finished last week and her pain levels are improving but still present daily. Works [place] includes excision of soft tissue mass (R knee 07/16/20), anxiety, asthma, HTN, hypothyroidism, obesity. Has been attempting everything she can think of for pain reduction including: orthotics, volteron cream, icing, stretching. 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    Limitations Sitting;Standing;Walking;House hold activities    How long can you sit comfortably? half hour    How long can you stand comfortably? not since steroids but is initally painful upon standing after sitting.    How long can you walk comfortably? only after prolonged sitting.    Diagnostic tests x ray: cleared    Patient Stated Goals to stop being painful    Currently in Pain? Yes    Pain Score 1     Pain Location Heel    Pain Orientation Right    Pain Descriptors / Indicators Aching;Spasm    Pain Type Acute pain    Pain Onset More than a month ago    Pain Frequency Intermittent                Manual:  Long sitting:   STM with metal tool to R  achilles tendon for IASTM x 8 minutes with hook tool.  STM to R calf with implementation of effleurage and ptrissage for muscle tissue tension reduction and trigger point reduction x 13 minutes Roller to R calf x 3 minutes Dorsiflexion stretch in long sitting with PT overpressure 6x 30 seconds ice cup massage x 3 minutes to R achilles.    Therex: Posterior calf stretch on 2x4 next to // bars 2x 60 second holds; bent knee 2x 60 seconds Half foam roller: flat side up: df/pf with progressive calf lengthening with repetition 10x 10 second holds     Pt educated throughout session about proper posture and technique with exercises. Improved exercise technique, movement at target joints, use of target muscles after min to mod verbal, visual, tactile cues                         PT Education - 10/27/20 1708    Education Details  exercise technique, manual, stretching    Person(s) Educated Patient    Methods Explanation;Demonstration;Tactile cues;Verbal cues    Comprehension Verbalized understanding;Returned demonstration;Verbal cues required;Tactile cues required            PT Short Term Goals - 10/16/20 1739      PT SHORT TERM GOAL #1   Title Patient will be independent in home exercise program to improve strength/mobility for better functional independence with ADLs.    Baseline 11/4 : HEP given    Time 2    Period Weeks    Status New    Target Date 10/30/20             PT Long Term Goals - 10/16/20 1743      PT LONG TERM GOAL #1   Title Patient will increase FOTO score to equal to or greater than  82%  to demonstrate statistically significant improvement in mobility and quality of life.    Baseline 11/4: risk adjusted 46%, normal 86    Time 4    Period Weeks    Status New    Target Date 11/13/20      PT LONG TERM GOAL #2   Title Patient will increase R foot strength to > 4+/5 for return to PLOF and to normalize gait mechanics    Baseline 11/4: see note    Time 4    Period Weeks    Status New    Target Date 11/13/20      PT LONG TERM GOAL #3   Title Patient will report a worst pain of 3/10 on VAS in R achilles  to improve tolerance with ADLs and reduced symptoms with activities.    Baseline 11/4: 9/10    Time 4    Period Weeks    Status New    Target Date 11/13/20                 Plan - 10/28/20 0959    Clinical Impression Statement Patient does have increased tenderness to midcalf this session potentially due to "overdoing" it on Saturday, however achilles insertion remains the same as previous session with no evidence of injury. Patient presents with excellent motivation throughout physical therapy session. Progressive lengthening and soft tissue muscle tension reduction progressed as well as introduction of close chain interventions. Patient would benefit from skilled physical  therapy to decrease pain levels, frequency, and improve mobility for return to PLOF and quality of life    Personal Factors and Comorbidities Comorbidity 3+;Fitness;Past/Current Experience;Time  since onset of injury/illness/exacerbation;Profession    Comorbidities anxiety, asthma, HTN, hypothyroidism, obesity    Examination-Activity Limitations Sleep;Sit;Locomotion Level;Squat;Stairs;Stand    Examination-Participation Restrictions Cleaning;Community Activity;Driving;Occupation;Laundry;Volunteer;Yard Work;Shop    Stability/Clinical Decision Making Evolving/Moderate complexity    Rehab Potential Good    PT Frequency 1x / week    PT Duration 4 weeks    PT Treatment/Interventions ADLs/Self Care Home Management;Biofeedback;Cryotherapy;Electrical Stimulation;Iontophoresis 4mg /ml Dexamethasone;Moist Heat;Ultrasound;Functional mobility training;Stair training;Gait training;DME Instruction;Therapeutic activities;Therapeutic exercise;Balance training;Neuromuscular re-education;Orthotic Fit/Training;Patient/family education;Manual techniques;Compression bandaging;Taping;Splinting;Energy conservation;Dry needling;Passive range of motion;Visual/perceptual remediation/compensation    PT Next Visit Plan STM with metal tool to achilles tendon, stretch, ice cup massage    PT Home Exercise Plan see above    Consulted and Agree with Plan of Care Patient           Patient will benefit from skilled therapeutic intervention in order to improve the following deficits and impairments:  Abnormal gait, Decreased activity tolerance, Decreased range of motion, Decreased mobility, Decreased strength, Difficulty walking, Increased fascial restricitons, Impaired flexibility, Impaired perceived functional ability, Increased muscle spasms, Improper body mechanics, Pain, Obesity  Visit Diagnosis: Pain in right ankle and joints of right foot  Muscle weakness (generalized)     Problem List Patient Active Problem List    Diagnosis Date Noted  . Morbid obesity with BMI of 40.0-44.9, adult ([place]) 05/01/2020  . Prediabetes 01/11/2020  . Allergic rhinitis 11/23/2019  . Colon polyps 11/17/2018  . Annual physical exam 11/17/2018  . Pulmonary nodules 06/02/2018  . Kidney stones 05/29/2018  . Fatty liver 01/27/2018  . HLD (hyperlipidemia) 12/16/2017  . Midline low back pain with bilateral sciatica 11/28/2017  . Asthma, well controlled 11/28/2017  . Gastroesophageal reflux disease 11/28/2017  . Postnasal drip 11/28/2017  . Hoarseness of voice 11/28/2017  . Asthma, mild intermittent 06/30/2016  . Positive ANA (antinuclear antibody) 02/20/2016  . Arthralgia 02/20/2016  . Vitamin D deficiency 02/20/2016  . Cholelithiasis 04/25/2014  . Severe obesity (BMI >= 40) ([place]) 04/25/2014  . Routine general medical examination at a health care facility 04/04/2014  . Left sided sciatica 04/04/2014  . Screen for colon cancer 04/04/2014  . Hypertension 11/19/2011  . Hypothyroidism 11/19/2011  . Anxiety 11/19/2011   Janna Arch, PT, DPT   10/28/2020, 10:00 AM  [place] MAIN [facility] SERVICES [address] [place], [place], [address] Phone: [phone]   Fax:  [phone]  Name: Barbara Blake MRN: [id number] Date of Birth: Jan 12, 1956

## 2020-11-03 ENCOUNTER — Ambulatory Visit: Payer: No Typology Code available for payment source

## 2020-11-10 ENCOUNTER — Ambulatory Visit: Payer: No Typology Code available for payment source

## 2020-11-10 DIAGNOSIS — M25571 Pain in right ankle and joints of right foot: Secondary | ICD-10-CM | POA: Diagnosis not present

## 2020-11-10 DIAGNOSIS — M6281 Muscle weakness (generalized): Secondary | ICD-10-CM

## 2020-11-10 NOTE — Therapy (Signed)
[place] MAIN [facility] SERVICES [address] [place], [place], [address] Phone: [phone]   Fax:  [phone]  Physical Therapy Treatment/RECERT  Patient Details  Name: Barbara Blake MRN: [id number] Date of Birth: [birth year] Referring Provider (PT): Samara Deist DPm   Encounter Date: 11/10/2020   PT End of Session - 11/10/20 1739    Visit Number 4    Number of Visits 8    Date for PT Re-Evaluation 12/08/20    Authorization Type 63/10 eval 11/4    PT Start Time 1645    PT Stop Time 1728    PT Time Calculation (min) 43 min    Activity Tolerance Patient tolerated treatment well    Behavior During Therapy [facility] for tasks assessed/performed           Past Medical History:  Diagnosis Date  . Anxiety   . Asthma   . Gallstones   . History of kidney stones   . HTN (hypertension)   . Hypothyroidism   . Low back pain   . Lung nodule   . Obesity   . Vitamin D deficiency     Past Surgical History:  Procedure Laterality Date  . APPENDECTOMY    . DILATION AND CURETTAGE OF UTERUS    . EXTRACORPOREAL SHOCK WAVE LITHOTRIPSY Left 06/22/2018   Procedure: EXTRACORPOREAL SHOCK WAVE LITHOTRIPSY (ESWL);  Surgeon: Abbie Sons, MD;  Location: [facility] ORS;  Service: Urology;  Laterality: Left;  . LIPOMA EXCISION    . MASS EXCISION Right 07/16/2020   Procedure: EXCISION OF A SUBCUTANEOUS SOFT TISSUE MASS IN THE ANTERIOR ASPECT OF RIGHT KNEE.;  Surgeon: Corky Mull, MD;  Location: [facility] ORS;  Service: Orthopedics;  Laterality: Right;    There were no vitals filed for this visit.   Subjective Assessment - 11/10/20 1738    Subjective Patient reports she had a flare up on saturday after multiple days in slippers/socks on hardwood floors decorating her house. In general she feels she has improved 50% since starting therapy.    Pertinent History Patient is a 64 year old female with achilles tendonitis for past 8 months. It got worse over time so she  went to the doctor, took steroids for it which just finished last week and her pain levels are improving but still present daily. Works [place] includes excision of soft tissue mass (R knee 07/16/20), anxiety, asthma, HTN, hypothyroidism, obesity. Has been attempting everything she can think of for pain reduction including: orthotics, volteron cream, icing, stretching. 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    Limitations Sitting;Standing;Walking;House hold activities    How long can you sit comfortably? half hour    How long can you stand comfortably? not since steroids but is initally painful upon standing after sitting.    How long can you walk comfortably? only after prolonged sitting.    Diagnostic tests x ray: cleared    Patient Stated Goals to stop being painful    Currently in Pain? Yes    Pain Score 4     Pain Location Heel    Pain Orientation Right    Pain Descriptors / Indicators Aching    Pain Type Acute pain    Pain Onset More than a month ago    Pain Frequency Intermittent          R foot 4/10      goals:  FOTO: 79.8%  R foot strength: pf/ df 4+/5, eversion  inversion 4/5  VAS: 7-8/10 (one time) ; decreasing in frequency    Manual:  Long sitting:   STM with metal tool to R achilles tendon for IASTM x 8 minutes with hook tool.  STM to R calf with implementation of effleurage and ptrissage for muscle tissue tension reduction and trigger point reduction x 13 minutes Roller to R calf x 4 minutes Dorsiflexion stretch in long sitting with PT overpressure 6x 30 seconds  Therex: RTB df 15x, eversion 15x, inversion 15x prostretch df 30 second holds x 5 trials  Half foam roller: flat side up: df/pf with progressive calf lengthening with repetition 10x 10 second holds      Pt educated throughout session about proper posture and technique with exercises. Improved exercise technique, movement at target joints, use of target muscles after min to mod verbal, visual,  tactile cues                        PT Education - 11/10/20 1739    Education Details goals, POC, footwear    Person(s) Educated Patient    Methods Explanation;Demonstration;Tactile cues;Verbal cues    Comprehension Verbalized understanding;Returned demonstration;Verbal cues required;Tactile cues required            PT Short Term Goals - 11/10/20 1740      PT SHORT TERM GOAL #1   Title Patient will be independent in home exercise program to improve strength/mobility for better functional independence with ADLs.    Baseline 11/4 : HEP given 11/29: HEP compliant    Time 2    Period Weeks    Status Achieved    Target Date 10/30/20             PT Long Term Goals - 11/10/20 1740      PT LONG TERM GOAL #1   Title Patient will increase FOTO score to equal to or greater than  82%  to demonstrate statistically significant improvement in mobility and quality of life.    Baseline 11/4: risk adjusted 46%, normal 86 11/19: 79.8%    Time 4    Period Weeks    Status On-going    Target Date 12/08/20      PT LONG TERM GOAL #2   Title Patient will increase R foot strength to > 4+/5 for return to PLOF and to normalize gait mechanics    Baseline 11/4: see note 11/29: see note    Time 4    Period Weeks    Status Partially Met    Target Date 12/08/20      PT LONG TERM GOAL #3   Title Patient will report a worst pain of 3/10 on VAS in R achilles  to improve tolerance with ADLs and reduced symptoms with activities.    Baseline 11/4: 9/10 11/29: 7-8/10    Time 4    Period Weeks    Status Partially Met    Target Date 12/08/20                 Plan - 11/10/20 1743    Clinical Impression Statement Patient demonstrates progress towards functional goals with improved strength and decreased higher level pain. Her flare ups of pain are becoming less frequent and are more easily controlled however will continue to be an area for progress. presents with excellent  motivation throughout physical therapy session. Progressive lengthening and soft tissue muscle tension reduction progressed as well as introduction of close chain interventions. Patient would benefit  from skilled physical therapy to decrease pain levels, frequency, and improve mobility for return to PLOF and quality of life    Personal Factors and Comorbidities Comorbidity 3+;Fitness;Past/Current Experience;Time since onset of injury/illness/exacerbation;Profession    Comorbidities anxiety, asthma, HTN, hypothyroidism, obesity    Examination-Activity Limitations Sleep;Sit;Locomotion Level;Squat;Stairs;Stand    Examination-Participation Restrictions Cleaning;Community Activity;Driving;Occupation;Laundry;Volunteer;Yard Work;Shop    Stability/Clinical Decision Making Evolving/Moderate complexity    Rehab Potential Good    PT Frequency 1x / week    PT Duration 4 weeks    PT Treatment/Interventions ADLs/Self Care Home Management;Biofeedback;Cryotherapy;Electrical Stimulation;Iontophoresis 61m/ml Dexamethasone;Moist Heat;Ultrasound;Functional mobility training;Stair training;Gait training;DME Instruction;Therapeutic activities;Therapeutic exercise;Balance training;Neuromuscular re-education;Orthotic Fit/Training;Patient/family education;Manual techniques;Compression bandaging;Taping;Splinting;Energy conservation;Dry needling;Passive range of motion;Visual/perceptual remediation/compensation    PT Next Visit Plan STM with metal tool to achilles tendon, stretch, ice cup massage    PT Home Exercise Plan see above    Consulted and Agree with Plan of Care Patient           Patient will benefit from skilled therapeutic intervention in order to improve the following deficits and impairments:  Abnormal gait, Decreased activity tolerance, Decreased range of motion, Decreased mobility, Decreased strength, Difficulty walking, Increased fascial restricitons, Impaired flexibility, Impaired perceived functional  ability, Increased muscle spasms, Improper body mechanics, Pain, Obesity  Visit Diagnosis: Pain in right ankle and joints of right foot  Muscle weakness (generalized)     Problem List Patient Active Problem List   Diagnosis Date Noted  . Morbid obesity with BMI of 40.0-44.9, adult ([place] 05/01/2020  . Prediabetes 01/11/2020  . Allergic rhinitis 11/23/2019  . Colon polyps 11/17/2018  . Annual physical exam 11/17/2018  . Pulmonary nodules 06/02/2018  . Kidney stones 05/29/2018  . Fatty liver 01/27/2018  . HLD (hyperlipidemia) 12/16/2017  . Midline low back pain with bilateral sciatica 11/28/2017  . Asthma, well controlled 11/28/2017  . Gastroesophageal reflux disease 11/28/2017  . Postnasal drip 11/28/2017  . Hoarseness of voice 11/28/2017  . Asthma, mild intermittent 06/30/2016  . Positive ANA (antinuclear antibody) 02/20/2016  . Arthralgia 02/20/2016  . Vitamin D deficiency 02/20/2016  . Cholelithiasis 04/25/2014  . Severe obesity (BMI >= 40) ([place] 04/25/2014  . Routine general medical examination at a health care facility 04/04/2014  . Left sided sciatica 04/04/2014  . Screen for colon cancer 04/04/2014  . Hypertension 11/19/2011  . Hypothyroidism 11/19/2011  . Anxiety 11/19/2011   MJanna Arch PT, DPT   11/10/2020, 5:44 PM  [facility] [place]MAIN [facility]SERV[facility] [address]RS[address] [place] [address]Phone: [phone]  Fax:  [phone] Name: MGIARA MCGAUGHEYMRN: [id number] of Birth: 804-21-1957

## 2020-11-13 ENCOUNTER — Other Ambulatory Visit: Payer: Self-pay | Admitting: Podiatry

## 2020-11-14 ENCOUNTER — Ambulatory Visit: Payer: No Typology Code available for payment source | Attending: Internal Medicine

## 2020-11-14 ENCOUNTER — Other Ambulatory Visit: Payer: Self-pay | Admitting: Internal Medicine

## 2020-11-14 DIAGNOSIS — Z23 Encounter for immunization: Secondary | ICD-10-CM

## 2020-11-14 NOTE — Progress Notes (Signed)
   Covid-19 Vaccination Clinic  Name:  Barbara Blake    MRN: [id number] DOB: [birth year]  11/14/2020  [name] was observed post Covid-19 immunization for 15 minutes without incident. She was provided with Vaccine Information Sheet and instruction to access the V-Safe system.   [name] was instructed to call 911 with any severe reactions post vaccine: Marland Kitchen Difficulty breathing  . Swelling of face and throat  . A fast heartbeat  . A bad rash all over body  . Dizziness and weakness   Immunizations Administered    Name Date Dose VIS Date Route   Pfizer COVID-19 Vaccine 11/14/2020  1:57 PM 0.3 mL 10/01/2020 Intramuscular   Manufacturer: [place]   Lot: [id number]   [place]: [id number]

## 2020-11-17 ENCOUNTER — Ambulatory Visit: Payer: No Typology Code available for payment source | Attending: Podiatry

## 2020-11-17 DIAGNOSIS — M25571 Pain in right ankle and joints of right foot: Secondary | ICD-10-CM | POA: Insufficient documentation

## 2020-11-17 DIAGNOSIS — M6281 Muscle weakness (generalized): Secondary | ICD-10-CM | POA: Insufficient documentation

## 2020-11-20 ENCOUNTER — Ambulatory Visit: Payer: No Typology Code available for payment source

## 2020-11-24 ENCOUNTER — Ambulatory Visit: Payer: No Typology Code available for payment source

## 2020-11-24 ENCOUNTER — Other Ambulatory Visit: Payer: Self-pay

## 2020-11-24 DIAGNOSIS — M25571 Pain in right ankle and joints of right foot: Secondary | ICD-10-CM | POA: Diagnosis not present

## 2020-11-24 DIAGNOSIS — M6281 Muscle weakness (generalized): Secondary | ICD-10-CM | POA: Diagnosis present

## 2020-11-24 NOTE — Therapy (Signed)
[place] MAIN [facility] SERVICES [address] [place], [place], [address] Phone: [phone]   Fax:  [phone]  Physical Therapy Treatment  Patient Details  Name: Barbara Blake MRN: [id number] Date of Birth: 03-Sep-1956 Referring Provider (PT): Samara Deist DPm   Encounter Date: 11/24/2020   PT End of Session - 11/24/20 1625    Visit Number 5    Number of Visits 8    Date for PT Re-Evaluation 12/08/20    PT Start Time 8110    PT Stop Time 1645    PT Time Calculation (min) 40 min    Activity Tolerance Patient tolerated treatment well    Behavior During Therapy [facility] for tasks assessed/performed           Past Medical History:  Diagnosis Date  . Anxiety   . Asthma   . Gallstones   . History of kidney stones   . HTN (hypertension)   . Hypothyroidism   . Low back pain   . Lung nodule   . Obesity   . Vitamin D deficiency     Past Surgical History:  Procedure Laterality Date  . APPENDECTOMY    . DILATION AND CURETTAGE OF UTERUS    . EXTRACORPOREAL SHOCK WAVE LITHOTRIPSY Left 06/22/2018   Procedure: EXTRACORPOREAL SHOCK WAVE LITHOTRIPSY (ESWL);  Surgeon: Abbie Sons, MD;  Location: [facility] ORS;  Service: Urology;  Laterality: Left;  . LIPOMA EXCISION    . MASS EXCISION Right 07/16/2020   Procedure: EXCISION OF A SUBCUTANEOUS SOFT TISSUE MASS IN THE ANTERIOR ASPECT OF RIGHT KNEE.;  Surgeon: Corky Mull, MD;  Location: [facility] ORS;  Service: Orthopedics;  Laterality: Right;    There were no vitals filed for this visit.   Subjective Assessment - 11/24/20 1611    Subjective Pt doing well, still alittle stiff after working and first thing in morning.    Pertinent History Patient is a 64 year old female with achilles tendonitis for past 8 months. It got worse over time so she went to the doctor, took steroids for it which just finished last week and her pain levels are improving but still present daily. Works [place] includes excision of  soft tissue mass (R knee 07/16/20), anxiety, asthma, HTN, hypothyroidism, obesity. Has been attempting everything she can think of for pain reduction including: orthotics, volteron cream, icing, stretching. 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    Currently in Pain? No/denies           INTERVENTION THIS DATE: -Seated RLE heel slides AA/ROM ankle 3 minutes total, 3sec hold into DF stretch  -Standing calf stretch c Prostretch 3x30sec (RLE only)  -Standing Double Heel raise 1x15, finger support -standing double dorsiflexion, heels elevated to specific ROM desired 2x15x1secH  -standing single heel raise 2x15 bilat  *power and motor control appears similar bilat, pt endorses = strength perception bilat;  Pt feels stetch of tissue at heel in first set, improved toward end of set, then feels no pain at all during second set.   Extensive education on HEP updates and managing tendon loading independently based on symptom. Author mentioned that as pain has been under control for past week and symptoms are favorably modified with exercise, it may be of benefit to defer oral steroid course as not to delay any heeling process potential. Pt has not started this yet, but would prefer to avoid a second course of steroids if not needed.  PT Education - 11/24/20 1613    Education Details Importance of HEP activity    Person(s) Educated Patient    Methods Explanation    Comprehension Verbalized understanding;Returned demonstration            PT Short Term Goals - 11/10/20 1740      PT SHORT TERM GOAL #1   Title Patient will be independent in home exercise program to improve strength/mobility for better functional independence with ADLs.    Baseline 11/4 : HEP given 11/29: HEP compliant    Time 2    Period Weeks    Status Achieved    Target Date 10/30/20             PT Long Term Goals - 11/10/20 1740      PT LONG TERM GOAL #1   Title Patient will increase FOTO  score to equal to or greater than  82%  to demonstrate statistically significant improvement in mobility and quality of life.    Baseline 11/4: risk adjusted 46%, normal 86 11/19: 79.8%    Time 4    Period Weeks    Status On-going    Target Date 12/08/20      PT LONG TERM GOAL #2   Title Patient will increase R foot strength to > 4+/5 for return to PLOF and to normalize gait mechanics    Baseline 11/4: see note 11/29: see note    Time 4    Period Weeks    Status Partially Met    Target Date 12/08/20      PT LONG TERM GOAL #3   Title Patient will report a worst pain of 3/10 on VAS in R achilles  to improve tolerance with ADLs and reduced symptoms with activities.    Baseline 11/4: 9/10 11/29: 7-8/10    Time 4    Period Weeks    Status Partially Met    Target Date 12/08/20                 Plan - 11/24/20 1626    Clinical Impression Statement Continued with current plan of care as laid out in evaluation and recent prior sessions. Pt remains motivated to advance progress toward goals. Rest breaks provided as needed. Author maintains all interventions within appropriate level of intensity as not to purposefully exacerbate pain. Pt does require varying levels of assistance and cuing for completion of exercises for correct form and symptoms response. Pt continues to demonstrate progress toward goals AEB progression of some interventions this date either in volume or intensity. Shifted gear this session toward more progressive tendon loading, which has a favorable affect immediately decreasing pain in session. Pt updated on HEP to increase to BID calf stretching, and BID single heel raises. Pt educated on how to modify heel raises should she experience a flare in pain during the week. Pt reports she will defer her oral steroid course at this time as she is not having pain control issues at present.     Personal Factors and Comorbidities Comorbidity 3+;Fitness;Past/Current Experience;Time  since onset of injury/illness/exacerbation;Profession    Comorbidities anxiety, asthma, HTN, hypothyroidism, obesity    Examination-Activity Limitations Sleep;Sit;Locomotion Level;Squat;Stairs;Stand    Examination-Participation Restrictions Cleaning;Community Activity;Driving;Occupation;Laundry;Volunteer;Yard Work;Shop    Stability/Clinical Decision Making Evolving/Moderate complexity    Clinical Decision Making Moderate    Rehab Potential Good    PT Frequency 1x / week    PT Duration 4 weeks    PT Treatment/Interventions ADLs/Self Care Home Management;Biofeedback;Cryotherapy;Electrical Stimulation;Iontophoresis  72m/ml Dexamethasone;Moist Heat;Ultrasound;Functional mobility training;Stair training;Gait training;DME Instruction;Therapeutic activities;Therapeutic exercise;Balance training;Neuromuscular re-education;Orthotic Fit/Training;Patient/family education;Manual techniques;Compression bandaging;Taping;Splinting;Energy conservation;Dry needling;Passive range of motion;Visual/perceptual remediation/compensation    PT Next Visit Plan STM with metal tool to achilles tendon, stretch, ice cup massage; progress tendon loading while avoiding ankle DF angles.    PT Home Exercise Plan see above    Consulted and Agree with Plan of Care Patient           Patient will benefit from skilled therapeutic intervention in order to improve the following deficits and impairments:  Abnormal gait,Decreased activity tolerance,Decreased range of motion,Decreased mobility,Decreased strength,Difficulty walking,Increased fascial restricitons,Impaired flexibility,Impaired perceived functional ability,Increased muscle spasms,Improper body mechanics,Pain,Obesity  Visit Diagnosis: Pain in right ankle and joints of right foot  Muscle weakness (generalized)     Problem List Patient Active Problem List   Diagnosis Date Noted  . Morbid obesity with BMI of 40.0-44.9, adult ([place] 05/01/2020  . Prediabetes 01/11/2020   . Allergic rhinitis 11/23/2019  . Colon polyps 11/17/2018  . Annual physical exam 11/17/2018  . Pulmonary nodules 06/02/2018  . Kidney stones 05/29/2018  . Fatty liver 01/27/2018  . HLD (hyperlipidemia) 12/16/2017  . Midline low back pain with bilateral sciatica 11/28/2017  . Asthma, well controlled 11/28/2017  . Gastroesophageal reflux disease 11/28/2017  . Postnasal drip 11/28/2017  . Hoarseness of voice 11/28/2017  . Asthma, mild intermittent 06/30/2016  . Positive ANA (antinuclear antibody) 02/20/2016  . Arthralgia 02/20/2016  . Vitamin D deficiency 02/20/2016  . Cholelithiasis 04/25/2014  . Severe obesity (BMI >= 40) ([place] 04/25/2014  . Routine general medical examination at a health care facility 04/04/2014  . Left sided sciatica 04/04/2014  . Screen for colon cancer 04/04/2014  . Hypertension 11/19/2011  . Hypothyroidism 11/19/2011  . Anxiety 11/19/2011   5:00 PM, 11/24/20 AEtta Grandchild PT, DPT Physical Therapist - [email or web address] 11/24/2020, 4:48 PM  CR[place]yMAIN RMass[facility] HospitalSERVICES [address] [place] [address]Phone: [phone]  Fax:  [phone] Name: Barbara SZYMANOWSKIMRN: [id number] of Birth: [birth year]

## 2020-11-27 ENCOUNTER — Encounter: Payer: Self-pay | Admitting: Internal Medicine

## 2020-11-28 ENCOUNTER — Other Ambulatory Visit: Payer: Self-pay

## 2020-11-28 ENCOUNTER — Ambulatory Visit (INDEPENDENT_AMBULATORY_CARE_PROVIDER_SITE_OTHER): Payer: No Typology Code available for payment source | Admitting: Internal Medicine

## 2020-11-28 ENCOUNTER — Encounter: Payer: Self-pay | Admitting: Internal Medicine

## 2020-11-28 ENCOUNTER — Other Ambulatory Visit (HOSPITAL_COMMUNITY)
Admission: RE | Admit: 2020-11-28 | Discharge: 2020-11-28 | Disposition: A | Discharge status: Home / Self Care | Payer: No Typology Code available for payment source | Source: Ambulatory Visit | Attending: Internal Medicine | Admitting: Internal Medicine

## 2020-11-28 ENCOUNTER — Other Ambulatory Visit: Payer: Self-pay | Admitting: Internal Medicine

## 2020-11-28 ENCOUNTER — Encounter: Payer: No Typology Code available for payment source | Admitting: Internal Medicine

## 2020-11-28 VITALS — BP 126/84 | HR 76 | Temp 98.2°F | Ht 61.73 in | Wt 232.6 lb

## 2020-11-28 DIAGNOSIS — Z1231 Encounter for screening mammogram for malignant neoplasm of breast: Secondary | ICD-10-CM

## 2020-11-28 DIAGNOSIS — J309 Allergic rhinitis, unspecified: Secondary | ICD-10-CM

## 2020-11-28 DIAGNOSIS — E039 Hypothyroidism, unspecified: Secondary | ICD-10-CM | POA: Diagnosis not present

## 2020-11-28 DIAGNOSIS — E559 Vitamin D deficiency, unspecified: Secondary | ICD-10-CM

## 2020-11-28 DIAGNOSIS — Z124 Encounter for screening for malignant neoplasm of cervix: Secondary | ICD-10-CM | POA: Diagnosis not present

## 2020-11-28 DIAGNOSIS — I10 Essential (primary) hypertension: Secondary | ICD-10-CM | POA: Diagnosis not present

## 2020-11-28 DIAGNOSIS — R7303 Prediabetes: Secondary | ICD-10-CM

## 2020-11-28 DIAGNOSIS — Z0000 Encounter for general adult medical examination without abnormal findings: Secondary | ICD-10-CM | POA: Diagnosis not present

## 2020-11-28 DIAGNOSIS — F419 Anxiety disorder, unspecified: Secondary | ICD-10-CM

## 2020-11-28 DIAGNOSIS — K219 Gastro-esophageal reflux disease without esophagitis: Secondary | ICD-10-CM

## 2020-11-28 DIAGNOSIS — Z1211 Encounter for screening for malignant neoplasm of colon: Secondary | ICD-10-CM

## 2020-11-28 DIAGNOSIS — Z1329 Encounter for screening for other suspected endocrine disorder: Secondary | ICD-10-CM

## 2020-11-28 DIAGNOSIS — J4520 Mild intermittent asthma, uncomplicated: Secondary | ICD-10-CM

## 2020-11-28 LAB — COMPREHENSIVE METABOLIC PANEL
ALT: 21 U/L (ref 0–35)
AST: 17 U/L (ref 0–37)
Albumin: 3.9 g/dL (ref 3.5–5.2)
Alkaline Phosphatase: 73 U/L (ref 39–117)
BUN: 19 mg/dL (ref 6–23)
CO2: 31 mEq/L (ref 19–32)
Calcium: 9.3 mg/dL (ref 8.4–10.5)
Chloride: 105 mEq/L (ref 96–112)
Creatinine, Ser: 0.77 mg/dL (ref 0.40–1.20)
GFR: 82.16 mL/min (ref >60.00)
Glucose, Bld: 87 mg/dL (ref 70–99)
Potassium: 4.1 mEq/L (ref 3.5–5.1)
Sodium: 141 mEq/L (ref 135–145)
Total Bilirubin: 1.4 mg/dL — ABNORMAL HIGH (ref 0.2–1.2)
Total Protein: 6.8 g/dL (ref 6.0–8.3)

## 2020-11-28 LAB — CBC WITH DIFFERENTIAL/PLATELET
Basophils Absolute: 0.1 10*3/uL (ref 0.0–0.1)
Basophils Relative: 1 % (ref 0.0–3.0)
Eosinophils Absolute: 0.2 10*3/uL (ref 0.0–0.7)
Eosinophils Relative: 2.2 % (ref 0.0–5.0)
HCT: 43 % (ref 36.0–46.0)
Hemoglobin: 14.4 g/dL (ref 12.0–15.0)
Lymphocytes Relative: 31.6 % (ref 12.0–46.0)
Lymphs Abs: 2.3 10*3/uL (ref 0.7–4.0)
MCHC: 33.6 g/dL (ref 30.0–36.0)
MCV: 92.2 fl (ref 78.0–100.0)
Monocytes Absolute: 0.5 10*3/uL (ref 0.1–1.0)
Monocytes Relative: 6.1 % (ref 3.0–12.0)
Neutro Abs: 4.4 10*3/uL (ref 1.4–7.7)
Neutrophils Relative %: 59.1 % (ref 43.0–77.0)
Platelets: 234 10*3/uL (ref 150.0–400.0)
RBC: 4.66 Mil/uL (ref 3.87–5.11)
RDW: 14.1 % (ref 11.5–15.5)
WBC: 7.4 10*3/uL (ref 4.0–10.5)

## 2020-11-28 LAB — LIPID PANEL
Cholesterol: 187 mg/dL (ref 0–200)
HDL: 59.9 mg/dL (ref >39.00)
LDL Cholesterol: 107 mg/dL — ABNORMAL HIGH (ref 0–99)
NonHDL: 127.21
Total CHOL/HDL Ratio: 3
Triglycerides: 101 mg/dL (ref 0.0–149.0)
VLDL: 20.2 mg/dL (ref 0.0–40.0)

## 2020-11-28 LAB — VITAMIN D 25 HYDROXY (VIT D DEFICIENCY, FRACTURES): VITD: 32.02 ng/mL (ref 30.00–100.00)

## 2020-11-28 LAB — TSH: TSH: 1.96 u[IU]/mL (ref 0.35–4.50)

## 2020-11-28 LAB — HEMOGLOBIN A1C: Hgb A1c MFr Bld: 5.5 % (ref 4.6–6.5)

## 2020-11-28 MED ORDER — LEVOTHYROXINE SODIUM 175 MCG PO TABS: 175.0000 ug | ORAL_TABLET | Freq: Every day | ORAL | 3 refills | Status: DC

## 2020-11-28 MED ORDER — LOSARTAN POTASSIUM 100 MG PO TABS: 100.0000 mg | ORAL_TABLET | Freq: Every day | ORAL | 3 refills | Status: DC

## 2020-11-28 MED ORDER — ALBUTEROL SULFATE HFA 108 (90 BASE) MCG/ACT IN AERS: 1.0000 | INHALATION_SPRAY | Freq: Four times a day (QID) | RESPIRATORY_TRACT | 11 refills | Status: DC | PRN

## 2020-11-28 MED ORDER — HYDROCHLOROTHIAZIDE 25 MG PO TABS: 25.0000 mg | ORAL_TABLET | Freq: Every day | ORAL | 3 refills | Status: DC

## 2020-11-28 MED ORDER — ALPRAZOLAM 0.25 MG PO TABS: 0.1250 mg | ORAL_TABLET | Freq: Every day | ORAL | 2 refills | Status: DC | PRN

## 2020-11-28 MED ORDER — BUDESONIDE-FORMOTEROL FUMARATE 80-4.5 MCG/ACT IN AERO
2.0000 | INHALATION_SPRAY | Freq: Two times a day (BID) | RESPIRATORY_TRACT | 11 refills | Status: DC
  Filled 2021-07-09: qty 10.2, 30d supply, fill #0

## 2020-11-28 MED ORDER — AMLODIPINE BESYLATE 5 MG PO TABS: 5.0000 mg | ORAL_TABLET | ORAL | 3 refills | Status: DC

## 2020-11-28 NOTE — Progress Notes (Signed)
Chief Complaint  Patient presents with  . Annual Exam  . Gynecologic Exam   Annual  1. HTN overall controlled on norvasc 5 mg qd hctz 25 mg qd losartan 100 mg qd  2. Hypothyroidism on levo 175 mcg qd  3. Achilles tendonitis seeing Dr. Vickki Muff and PT [facility] had taper of steroids and another given but dose taper did not take  4. H/o kidney stones no sxs or UTI sx's now    Review of Systems  Constitutional: Negative for weight loss.  HENT: Negative for hearing loss.   Eyes: Negative for blurred vision.  Respiratory: Negative for shortness of breath.   Cardiovascular: Negative for chest pain.  Gastrointestinal: Negative for abdominal pain.  Musculoskeletal: Negative for falls.  Skin: Negative for rash.  Neurological: Negative for headaches.   Past Medical History:  Diagnosis Date  . Anxiety   . Asthma   . Gallstones   . History of kidney stones   . HTN (hypertension)   . Hypothyroidism   . Low back pain   . Lung nodule   . Obesity   . Vitamin D deficiency    Past Surgical History:  Procedure Laterality Date  . APPENDECTOMY    . DILATION AND CURETTAGE OF UTERUS    . EXTRACORPOREAL SHOCK WAVE LITHOTRIPSY Left 06/22/2018   Procedure: EXTRACORPOREAL SHOCK WAVE LITHOTRIPSY (ESWL);  Surgeon: Abbie Sons, MD;  Location: [facility] ORS;  Service: Urology;  Laterality: Left;  . LIPOMA EXCISION    . MASS EXCISION Right 07/16/2020   Procedure: EXCISION OF A SUBCUTANEOUS SOFT TISSUE MASS IN THE ANTERIOR ASPECT OF RIGHT KNEE.;  Surgeon: Corky Mull, MD;  Location: [facility] ORS;  Service: Orthopedics;  Laterality: Right;   Family History  Problem Relation Age of Onset  . Alzheimer's disease Mother   . Hypertension Mother   . Cancer Mother 72       colon  . Lupus Daughter   . Heart attack Maternal Grandmother   . Breast cancer Other   . Breast cancer Maternal Aunt 74  . Bladder Cancer Neg Hx   . Kidney cancer Neg Hx    Social History   Socioeconomic History  . Marital  status: Married    Spouse name: Not on file  . Number of children: 2  . Years of education: Not on file  . Highest education level: Not on file  Occupational History  . 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 Smoking status: Never Smoker  . Smokeless tobacco: Never Used  Vaping Use  . Vaping Use: Never used  Substance and Sexual Activity  . Alcohol use: No    Alcohol/week: 0.0 standard drinks  . Drug use: No  . Sexual activity: Yes    Birth control/protection: Post-menopausal  Other Topics Concern  . Not on file  Social History Narrative   Lives in [place].   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    From [facility]   As of 11/2019 married 101 years    2 kids       Regular Exercise -  Not at this time   Daily Caffeine Use:  1 cup hot tea daily         Social Determinants of Health   Financial Resource Strain: Not on file  Food Insecurity: Not on file  Transportation Needs: Not on file  Physical Activity: Not on file  Stress: Not on file  Social  Connections: Not on file  Intimate Partner Violence: Not on file   Current Meds  Medication Sig  . Cholecalciferol (VITAMIN D3) 50 MCG (2000 UT) capsule Take 2,000 Units by mouth daily.   Marland Kitchen ibuprofen (ADVIL) 200 MG tablet Take 200 mg by mouth every 8 (eight) hours as needed for moderate pain.  . naproxen sodium (ALEVE) 220 MG tablet Take 440 mg by mouth daily as needed (pain).  . sodium chloride (OCEAN) 0.65 % SOLN nasal spray Place 1 spray into both nostrils as needed for congestion. Before atrovent  . [DISCONTINUED] albuterol (PROAIR HFA) 108 (90 Base) MCG/ACT inhaler Inhale 1-2 puffs into the lungs every 6 (six) hours as needed for wheezing or shortness of breath.  . [DISCONTINUED] ALPRAZolam (XANAX) 0.25 MG tablet Take 0.5 tablets (0.125 mg total) by mouth daily as needed for anxiety.  . [DISCONTINUED] amLODipine (NORVASC) 5 MG tablet Take 1 tablet (5 mg total) by mouth every morning.  .  [DISCONTINUED] budesonide-formoterol (SYMBICORT) 80-4.5 MCG/ACT inhaler Inhale 2 puffs into the lungs 2 (two) times daily. Rinse mouth  . [DISCONTINUED] hydrochlorothiazide (HYDRODIURIL) 25 MG tablet Take 1 tablet (25 mg total) by mouth daily.  . [DISCONTINUED] levothyroxine (SYNTHROID) 175 MCG tablet Take 1 tablet (175 mcg total) by mouth daily before breakfast.  . [DISCONTINUED] losartan (COZAAR) 100 MG tablet Take 1 tablet (100 mg total) by mouth daily.   No Known Allergies No results found for this or any previous visit (from the past 2160 hour(s)). Objective  Body mass index is 42.91 kg/m. Wt Readings from Last 3 Encounters:  11/28/20 232 lb 9.6 oz (105.5 kg)  07/16/20 233 lb 3.2 oz (105.8 kg)  07/15/20 237 lb (107.5 kg)   Temp Readings from Last 3 Encounters:  11/28/20 98.2 F (36.8 C) (Oral)  07/16/20 97.8 F (36.6 C) (Temporal)  04/30/20 (!) 97.5 F (36.4 C) (Temporal)   BP Readings from Last 3 Encounters:  11/28/20 126/84  07/16/20 (!) 114/58  04/30/20 134/86   Pulse Readings from Last 3 Encounters:  11/28/20 76  07/16/20 66  04/30/20 74    Physical Exam Vitals and nursing note reviewed. Exam conducted with a chaperone present.  Constitutional:      Appearance: Normal appearance. She is well-developed and well-groomed. She is morbidly obese.  HENT:     Head: Normocephalic and atraumatic.  Eyes:     Conjunctiva/sclera: Conjunctivae normal.     Pupils: Pupils are equal, round, and reactive to light.  Cardiovascular:     Rate and Rhythm: Normal rate and regular rhythm.     Heart sounds: Normal heart sounds. No murmur heard.   Pulmonary:     Effort: Pulmonary effort is normal.     Breath sounds: Normal breath sounds.  Chest:  Breasts: Breasts are symmetrical.     Right: Normal. No axillary adenopathy.     Left: Normal. No axillary adenopathy.    Abdominal:     General: Abdomen is flat. Bowel sounds are normal.     Tenderness: There is no abdominal  tenderness.  Genitourinary:    Pubic Area: No rash.      Labia:        Right: No rash.        Left: No rash.      Vagina: Normal.     Cervix: Normal.     Uterus: Normal.      Adnexa: Right adnexa normal and left adnexa normal.  Lymphadenopathy:     Upper Body:  Right upper body: No axillary adenopathy.     Left upper body: No axillary adenopathy.  Skin:    General: Skin is warm and dry.  Neurological:     General: No focal deficit present.     Mental Status: She is alert and oriented to person, place, and time. Mental status is at baseline.     Gait: Gait normal.  Psychiatric:        Attention and Perception: Attention and perception normal.        Mood and Affect: Mood and affect normal.        Speech: Speech normal.        Behavior: Behavior normal. Behavior is cooperative.        Thought Content: Thought content normal.        Cognition and Memory: Cognition and memory normal.        Judgment: Judgment normal.     Assessment  Plan  Annual physical exam Had fluutd Tdap had 01/11/20 pna 23 had 11/25/17  covid 3/3 Hep B immuneconsider hep A vaccineh/o fatty liver shingrix2/2 doses MMR immune Hep C negative  mammo neg11/10/21 negative ordered   Pap neg 11/25/17 neg HPVpap today normal and breast exam   colonoscopy had 06/07/14 Dr. Allen Norris polpys hyperplastic and tubularreferred today   rec healthy diet choices and exercisecurrently doing 20-25 min on treadmill   CT chest recommended to f/u CT ab/pelvis 05/2018 noted lung nodules  -as of 11/23/19 pt to call back with facility she wants to go to [place] costs to much  12/12/2019 IMPRESSION: Scattered tiny pulmonary nodules bilaterally. Some of these are calcified and likely related to postinflammatory change. The visualized nodules in the lower lobes are stable from the prior CT from 2019 consistent with a prior infectious etiology. No sizable nodules are seen. Given the low risk history, no  further follow-up is recommended.  Cholelithiasis without complicating factors.  Aortic Atherosclerosis (ICD10-I70.0).  -consider dermatology in future let for know for referral  GI referred today   rec healthy diet and exercise   Prediabetes - Plan: Hemoglobin A1Cl  Hypothyroidism, contorlled - Plan: TSH, levothyroxine (SYNTHROID) 175 MCG tablet  Allergic rhinitis, unspecified seasonality, unspecified trigger - Plan: albuterol (PROAIR HFA) 108 (90 Base) MCG/ACT inhaler, budesonide-formoterol (SYMBICORT) 80-4.5 MCG/ACT inhaler  Mild intermittent asthma, unspecified whether complicated - Plan: albuterol (PROAIR HFA) 108 (90 Base) MCG/ACT inhaler, budesonide-formoterol (SYMBICORT) 80-4.5 MCG/ACT inhaler  Anxiety - Plan: ALPRAZolam (XANAX) 0.25 MG tablet  Essential hypertension - Plan: amLODipine (NORVASC) 5 MG tablet, hydrochlorothiazide (HYDRODIURIL) 25 MG tablet, losartan (COZAAR) 100 MG tablet  BP controlled    Provider: Dr. Olivia Mackie McLean-Scocuzza-Internal Medicine

## 2020-11-28 NOTE — Patient Instructions (Addendum)
Let me know if you want to see dermatology  Continue healthy diet and exercise    DASH Eating Plan DASH stands for "Dietary Approaches to Stop Hypertension." The DASH eating plan is a healthy eating plan that has been shown to reduce high blood pressure (hypertension). It may also reduce your risk for type 2 diabetes, heart disease, and stroke. The DASH eating plan may also help with weight loss. What are tips for following this plan?  General guidelines  Avoid eating more than 2,300 mg (milligrams) of salt (sodium) a day. If you have hypertension, you may need to reduce your sodium intake to 1,500 mg a day.  Limit alcohol intake to no more than 1 drink a day for nonpregnant women and 2 drinks a day for men. One drink equals 12 oz of beer, 5 oz of wine, or 1 oz of hard liquor.  Work with your health care provider to maintain a healthy body weight or to lose weight. Ask what an ideal weight is for you.  Get at least 30 minutes of exercise that causes your heart to beat faster (aerobic exercise) most days of the week. Activities may include walking, swimming, or biking.  Work with your health care provider or diet and nutrition specialist (dietitian) to adjust your eating plan to your individual calorie needs. Reading food labels   Check food labels for the amount of sodium per serving. Choose foods with less than 5 percent of the Daily Value of sodium. Generally, foods with less than 300 mg of sodium per serving fit into this eating plan.  To find whole grains, look for the word "whole" as the first word in the ingredient list. Shopping  Buy products labeled as "low-sodium" or "no salt added."  Buy fresh foods. Avoid canned foods and premade or frozen meals. Cooking  Avoid adding salt when cooking. Use salt-free seasonings or herbs instead of table salt or sea salt. Check with your health care provider or pharmacist before using salt substitutes.  Do not fry foods. Cook foods  using healthy methods such as baking, boiling, grilling, and broiling instead.  Cook with heart-healthy oils, such as olive, canola, soybean, or sunflower oil. Meal planning  Eat a balanced diet that includes: ? 5 or more servings of fruits and vegetables each day. At each meal, try to fill half of your plate with fruits and vegetables. ? Up to 6-8 servings of whole grains each day. ? Less than 6 oz of lean meat, poultry, or fish each day. A 3-oz serving of meat is about the same size as a deck of cards. One egg equals 1 oz. ? 2 servings of low-fat dairy each day. ? A serving of nuts, seeds, or beans 5 times each week. ? Heart-healthy fats. Healthy fats called Omega-3 fatty acids are found in foods such as flaxseeds and coldwater fish, like sardines, salmon, and mackerel.  Limit how much you eat of the following: ? Canned or prepackaged foods. ? Food that is high in trans fat, such as fried foods. ? Food that is high in saturated fat, such as fatty meat. ? Sweets, desserts, sugary drinks, and other foods with added sugar. ? Full-fat dairy products.  Do not salt foods before eating.  Try to eat at least 2 vegetarian meals each week.  Eat more home-cooked food and less restaurant, buffet, and fast food.  When eating at a restaurant, ask that your food be prepared with less salt or no salt, if possible.  What foods are recommended? The items listed may not be a complete list. Talk with your dietitian about what dietary choices are best for you. Grains Whole-grain or whole-wheat bread. Whole-grain or whole-wheat pasta. Brown rice. [place]. Bulgur. Whole-grain and low-sodium cereals. Pita bread. Low-fat, low-sodium crackers. Whole-wheat flour tortillas. Vegetables Fresh or frozen vegetables (raw, steamed, roasted, or grilled). Low-sodium or reduced-sodium tomato and vegetable juice. Low-sodium or reduced-sodium tomato sauce and tomato paste. Low-sodium or reduced-sodium canned  vegetables. Fruits All fresh, dried, or frozen fruit. Canned fruit in natural juice (without added sugar). Meat and other protein foods Skinless chicken or [place]. Ground chicken or [place]. Pork with fat trimmed off. Fish and seafood. Egg whites. Dried beans, peas, or lentils. Unsalted nuts, nut butters, and seeds. Unsalted canned beans. Lean cuts of beef with fat trimmed off. Low-sodium, lean deli meat. Dairy Low-fat (1%) or fat-free (skim) milk. Fat-free, low-fat, or reduced-fat cheeses. Nonfat, low-sodium ricotta or cottage cheese. Low-fat or nonfat yogurt. Low-fat, low-sodium cheese. Fats and oils Soft margarine without trans fats. Vegetable oil. Low-fat, reduced-fat, or light mayonnaise and salad dressings (reduced-sodium). Canola, safflower, olive, soybean, and sunflower oils. Avocado. Seasoning and other foods Herbs. Spices. Seasoning mixes without salt. Unsalted popcorn and pretzels. Fat-free sweets. What foods are not recommended? The items listed may not be a complete list. Talk with your dietitian about what dietary choices are best for you. Grains Baked goods made with fat, such as croissants, muffins, or some breads. Dry pasta or rice meal packs. Vegetables Creamed or fried vegetables. Vegetables in a cheese sauce. Regular canned vegetables (not low-sodium or reduced-sodium). Regular canned tomato sauce and paste (not low-sodium or reduced-sodium). Regular tomato and vegetable juice (not low-sodium or reduced-sodium). Angie Fava. Olives. Fruits Canned fruit in a light or heavy syrup. Fried fruit. Fruit in cream or butter sauce. Meat and other protein foods Fatty cuts of meat. Ribs. Fried meat. Berniece Salines. Sausage. Bologna and other processed lunch meats. Salami. Fatback. Hotdogs. Bratwurst. Salted nuts and seeds. Canned beans with added salt. Canned or smoked fish. Whole eggs or egg yolks. Chicken or [place] with skin. Dairy Whole or 2% milk, cream, and half-and-half. Whole or full-fat  cream cheese. Whole-fat or sweetened yogurt. Full-fat cheese. Nondairy creamers. Whipped toppings. Processed cheese and cheese spreads. Fats and oils Butter. Stick margarine. Lard. Shortening. Ghee. Bacon fat. Tropical oils, such as coconut, palm kernel, or palm oil. Seasoning and other foods Salted popcorn and pretzels. Onion salt, garlic salt, seasoned salt, table salt, and sea salt. Worcestershire sauce. Tartar sauce. Barbecue sauce. Teriyaki sauce. Soy sauce, including reduced-sodium. Steak sauce. Canned and packaged gravies. Fish sauce. Oyster sauce. Cocktail sauce. Horseradish that you find on the shelf. Ketchup. Mustard. Meat flavorings and tenderizers. Bouillon cubes. Hot sauce and Tabasco sauce. Premade or packaged marinades. Premade or packaged taco seasonings. Relishes. Regular salad dressings. Where to find more information:  National Heart, Lung, and [place]: https://wilson-eaton.com/  American Heart Association: www.heart.org Summary  The DASH eating plan is a healthy eating plan that has been shown to reduce high blood pressure (hypertension). It may also reduce your risk for type 2 diabetes, heart disease, and stroke.  With the DASH eating plan, you should limit salt (sodium) intake to 2,300 mg a day. If you have hypertension, you may need to reduce your sodium intake to 1,500 mg a day.  When on the DASH eating plan, aim to eat more fresh fruits and vegetables, whole grains, lean proteins, low-fat dairy, and heart-healthy fats.  Work  with your health care provider or diet and nutrition specialist (dietitian) to adjust your eating plan to your individual calorie needs. This information is not intended to replace advice given to you by your health care provider. Make sure you discuss any questions you have with your health care provider. Document Revised: 11/11/2017 Document Reviewed: 11/22/2016 Elsevier Patient Education  [place].  [facility] Tend[facility] Ask your  health care provider which exercises are safe for you. Do exercises exactly as told by your health care provider and adjust them as directed. It is normal to feel mild stretching, pulling, tightness, or discomfort as you do these exercises. Stop right away if you feel sudden pain or your pain gets worse. Do not begin these exercises until told by your health care provider. Stretching and range-of-motion exercises These exercises warm up your muscles and joints and improve the movement and flexibility of your ankle. These exercises also help to relieve pain. Standing wall calf stretch with straight knee  1. Stand with your hands against a wall. 2. Extend your left / right leg behind you, and bend your front knee slightly. Keep both of your heels on the floor. 3. Point the toes of your back foot slightly inward. 4. Keeping your heels on the floor and your back knee straight, shift your weight toward the wall. Do not allow your back to arch. You should feel a gentle stretch in your upper calf. 5. Hold this position for __________ seconds. Repeat __________ times. Complete this exercise __________ times a day. Standing wall calf stretch with bent knee 1. Stand with your hands against a wall. 2. Extend your left / right leg behind you, and bend your front knee slightly. Keep both of your heels on the floor. 3. Point the toes of your back foot slightly inward. 4. Keeping your heels on the floor, bend your back knee slightly. You should feel a gentle stretch deep in your lower calf near your heel. 5. Hold this position for __________ seconds. Repeat __________ times. Complete this exercise __________ times a day. Strengthening exercises These exercises build strength and control of your ankle. Endurance is the ability to use your muscles for a long time, even after they get tired. Plantar flexion with band In this exercise, you push your toes downward, away from you, with an exercise band providing  resistance. 1. Sit on the floor with your left / right leg extended. You may put a pillow under your calf to give your foot more room to move. 2. Loop a rubber exercise band or tube around the ball of your left / right foot. The ball of your foot is on the walking surface, right under your toes. The band or tube should be slightly tense when your foot is relaxed. If the band or tube slips, you can put on your shoe or put a washcloth between the band and your foot to help it stay in place. 3. Slowly point your toes downward, pushing them away from you (plantar flexion). 4. Hold this position for __________ seconds. 5. Slowly release the tension in the band or tube, controlling smoothly until your foot is back to the starting position. 6. Repeat steps 1-5 with your left / right leg. Repeat __________ times. Complete this exercise __________ times a day. Eccentric heel drop  In this exercise, you stand and slowly raise your heel and then slowly lower it. This exercise lengthens the calf muscles (eccentric) while the heel bears weight. If this exercise  is too easy, try doing it while wearing a backpack with weights in it. 1. Stand on a step with the balls of your feet. The ball of your foot is on the walking surface, right under your toes. ? Do not put your heels on the step. ? For balance, rest your hands on the wall or on a railing. 2. Rise up onto the balls of your feet. 3. Keeping your heels up, shift all of your weight to your left / right leg and pick up your other leg. 4. Slowly lower your left / right leg so your heel drops below the level of the step. 5. Put down your other foot before returning to the start position. If told by your health care provider, build up to: ? 3 sets of 15 repetitions while keeping your knees straight. ? 3 sets of 15 repetitions while keeping your knees slightly bent as far as told by your health care provider. Repeat __________ times. Complete this exercise  __________ times a day. Balance exercises These exercises improve or maintain your balance. Balance is important in preventing falls. Single leg stand If this exercise is too easy, you can try it with your eyes closed or while standing on a pillow. 1. Without shoes, stand near a railing or in a door frame. Hold on to the railing or door frame as needed. 2. Stand on your left / right foot. Keep your big toe down on the floor and try to keep your arch lifted. 3. Hold this position for __________ seconds. Repeat __________ times. Complete this exercise __________ times a day. This information is not intended to replace advice given to you by your health care provider. Make sure you discuss any questions you have with your health care provider. Document Revised: 03/19/2019 Document Reviewed: 09/11/2018 Elsevier Patient Education  [place].

## 2020-12-01 ENCOUNTER — Ambulatory Visit: Payer: No Typology Code available for payment source

## 2020-12-02 LAB — CYTOLOGY - PAP
Comment: NEGATIVE
Diagnosis: UNDETERMINED — AB
High risk HPV: NEGATIVE

## 2020-12-08 ENCOUNTER — Ambulatory Visit: Payer: No Typology Code available for payment source

## 2020-12-15 ENCOUNTER — Ambulatory Visit: Payer: No Typology Code available for payment source | Attending: Podiatry

## 2020-12-15 ENCOUNTER — Ambulatory Visit: Payer: No Typology Code available for payment source

## 2020-12-15 ENCOUNTER — Other Ambulatory Visit: Payer: Self-pay

## 2020-12-15 DIAGNOSIS — M25571 Pain in right ankle and joints of right foot: Secondary | ICD-10-CM | POA: Diagnosis present

## 2020-12-15 DIAGNOSIS — M6281 Muscle weakness (generalized): Secondary | ICD-10-CM | POA: Diagnosis present

## 2020-12-15 NOTE — Therapy (Signed)
[place] MAIN [facility] SERVICES [address] [place], [place], [address] Phone: [phone]   Fax:  [phone]  Physical Therapy Treatment Physical Therapy Progress Note/Discharge Summary   Dates of reporting period  11/10/20   to   12/16/19  Patient Details  Name: Barbara Blake MRN: [id number] Date of Birth: Feb 14, 1956 Referring Provider (PT): Samara Deist DPm   Encounter Date: 12/15/2020   PT End of Session - 12/15/20 1606    Visit Number 6    Number of Visits 8    Date for PT Re-Evaluation 12/08/20    PT Start Time 0404    PT Stop Time 0435    PT Time Calculation (min) 31 min    Activity Tolerance Patient tolerated treatment well    Behavior During Therapy [facility] for tasks assessed/performed           Past Medical History:  Diagnosis Date  . Anxiety   . Asthma   . Gallstones   . History of kidney stones   . HTN (hypertension)   . Hypothyroidism   . Low back pain   . Lung nodule   . Obesity   . Vitamin D deficiency     Past Surgical History:  Procedure Laterality Date  . APPENDECTOMY    . DILATION AND CURETTAGE OF UTERUS    . EXTRACORPOREAL SHOCK WAVE LITHOTRIPSY Left 06/22/2018   Procedure: EXTRACORPOREAL SHOCK WAVE LITHOTRIPSY (ESWL);  Surgeon: Abbie Sons, MD;  Location: [facility] ORS;  Service: Urology;  Laterality: Left;  . LIPOMA EXCISION    . MASS EXCISION Right 07/16/2020   Procedure: EXCISION OF A SUBCUTANEOUS SOFT TISSUE MASS IN THE ANTERIOR ASPECT OF RIGHT KNEE.;  Surgeon: Corky Mull, MD;  Location: [facility] ORS;  Service: Orthopedics;  Laterality: Right;    There were no vitals filed for this visit.   Subjective Assessment - 12/15/20 1601    Subjective Patient reports she has been doing well and that since before Christmas she feels as though everything is back to normal.    Pertinent History Patient is a 66 year old female with achilles tendonitis for past 8 months. It got worse over time so she went to the  doctor, took steroids for it which just finished last week and her pain levels are improving but still present daily. Works [place] includes excision of soft tissue mass (R knee 07/16/20), anxiety, asthma, HTN, hypothyroidism, obesity. Has been attempting everything she can think of for pain reduction including: orthotics, volteron cream, icing, stretching. 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    Currently in Pain? No/denies    Pain Score 0-No pain               Manual:  Prone TPR right calf lateral > medial With passive stretching of right calf   Therex: Calf stretch step 30"x3 DL HR x20 SL HR x20                         PT Short Term Goals - 12/15/20 1607      PT SHORT TERM GOAL #1   Title Patient will be independent in home exercise program to improve strength/mobility for better functional independence with ADLs.    Baseline 11/4 : HEP given 11/29: HEP compliant, 12/15/20  pt reports stretching daily even though she has not had pain.    Time 2    Period Weeks  Status Achieved    Target Date 10/30/20             PT Long Term Goals - 12/15/20 1607      PT LONG TERM GOAL #1   Title aA    Baseline 11/4: risk adjusted 46%, normal 86 11/19: 79.8%,  1/3/222 96%    Time 4    Period Weeks    Status Achieved      PT LONG TERM GOAL #2   Title Patient will increase R foot strength to > 4+/5 for return to PLOF and to normalize gait mechanics    Baseline 11/4: see note 11/29: see note, 12/15/20 right ankle strength 5/5.    Time 4    Period Weeks    Status Achieved      PT LONG TERM GOAL #3   Title Patient will report a worst pain of 3/10 on VAS in R achilles  to improve tolerance with ADLs and reduced symptoms with activities.    Baseline 11/4: 9/10 11/29: 7-8/10,  12/15/20 pain at worst last couple weeks 0/10    Time 4    Period Weeks    Status Achieved                 Plan - 12/15/20 1645    Clinical Impression Statement Patient  has achieved goals and no longer requires skilled PT services at this time.  The patient best benefits from a continued home program including regular stretching which was discussed with the patient.    Personal Factors and Comorbidities Comorbidity 3+;Fitness;Past/Current Experience;Time since onset of injury/illness/exacerbation;Profession    Comorbidities anxiety, asthma, HTN, hypothyroidism, obesity    Examination-Activity Limitations Sleep;Sit;Locomotion Level;Squat;Stairs;Stand    Examination-Participation Restrictions Cleaning;Community Activity;Driving;Occupation;Laundry;Volunteer;Yard Work;Shop    Stability/Clinical Decision Making Evolving/Moderate complexity    Rehab Potential Good    PT Frequency 1x / week    PT Duration 4 weeks    PT Treatment/Interventions ADLs/Self Care Home Management;Biofeedback;Cryotherapy;Electrical Stimulation;Iontophoresis 4mg /ml Dexamethasone;Moist Heat;Ultrasound;Functional mobility training;Stair training;Gait training;DME Instruction;Therapeutic activities;Therapeutic exercise;Balance training;Neuromuscular re-education;Orthotic Fit/Training;Patient/family education;Manual techniques;Compression bandaging;Taping;Splinting;Energy conservation;Dry needling;Passive range of motion;Visual/perceptual remediation/compensation    PT Next Visit Plan STM with metal tool to achilles tendon, stretch, ice cup massage; progress tendon loading while avoiding ankle DF angles.    PT Home Exercise Plan see above    Consulted and Agree with Plan of Care Patient           Patient will benefit from skilled therapeutic intervention in order to improve the following deficits and impairments:  Abnormal gait,Decreased activity tolerance,Decreased range of motion,Decreased mobility,Decreased strength,Difficulty walking,Increased fascial restricitons,Impaired flexibility,Impaired perceived functional ability,Increased muscle spasms,Improper body mechanics,Pain,Obesity  Visit  Diagnosis: Pain in right ankle and joints of right foot  Muscle weakness (generalized)     Problem List Patient Active Problem List   Diagnosis Date Noted  . Morbid obesity with BMI of 40.0-44.9, adult (HCC) 05/01/2020  . Prediabetes 01/11/2020  . Allergic rhinitis 11/23/2019  . Colon polyps 11/17/2018  . Annual physical exam 11/17/2018  . Pulmonary nodules 06/02/2018  . Kidney stones 05/29/2018  . Fatty liver 01/27/2018  . HLD (hyperlipidemia) 12/16/2017  . Midline low back pain with bilateral sciatica 11/28/2017  . Asthma, well controlled 11/28/2017  . Gastroesophageal reflux disease 11/28/2017  . Postnasal drip 11/28/2017  . Hoarseness of voice 11/28/2017  . Asthma, mild intermittent 06/30/2016  . Positive ANA (antinuclear antibody) 02/20/2016  . Arthralgia 02/20/2016  . Vitamin D deficiency 02/20/2016  . Cholelithiasis 04/25/2014  . Severe obesity (  BMI >= 40) ([place]) 04/25/2014  . Routine general medical examination at a health care facility 04/04/2014  . Left sided sciatica 04/04/2014  . Screen for colon cancer 04/04/2014  . Hypertension 11/19/2011  . Hypothyroidism 11/19/2011  . Anxiety 11/19/2011    Hal Morales PT, DPT 12/15/2020, 4:46 PM  [place] MAIN [facility] SERVICES [address] [place], [place], [address] Phone: [phone]   Fax:  [phone]  Name: Barbara Blake MRN: [id number] Date of Birth: October 23, 1956

## 2020-12-18 ENCOUNTER — Ambulatory Visit: Payer: No Typology Code available for payment source

## 2021-03-17 ENCOUNTER — Other Ambulatory Visit: Payer: Self-pay

## 2021-03-23 ENCOUNTER — Other Ambulatory Visit: Payer: Self-pay

## 2021-03-23 MED FILL — Levothyroxine Sodium Tab 175 MCG: ORAL | 90 days supply | Qty: 90 | Fill #0 | Status: AC

## 2021-03-24 ENCOUNTER — Other Ambulatory Visit: Payer: Self-pay

## 2021-05-26 ENCOUNTER — Other Ambulatory Visit: Payer: Self-pay

## 2021-05-26 MED FILL — Alprazolam Tab 0.25 MG: ORAL | 60 days supply | Qty: 30 | Fill #0 | Status: AC

## 2021-06-11 ENCOUNTER — Other Ambulatory Visit: Payer: Self-pay | Admitting: Internal Medicine

## 2021-06-11 ENCOUNTER — Other Ambulatory Visit: Payer: Self-pay

## 2021-06-11 DIAGNOSIS — I10 Essential (primary) hypertension: Secondary | ICD-10-CM

## 2021-06-11 MED ORDER — HYDROCHLOROTHIAZIDE 25 MG PO TABS
ORAL_TABLET | Freq: Every day | ORAL | 3 refills | Status: DC
  Filled 2021-06-11: qty 90, 90d supply, fill #0
  Filled 2021-10-25: qty 90, 90d supply, fill #1
  Filled 2022-03-04: qty 90, 90d supply, fill #2

## 2021-06-11 MED FILL — Amlodipine Besylate Tab 5 MG (Base Equivalent): ORAL | 90 days supply | Qty: 90 | Fill #0 | Status: AC

## 2021-06-11 MED FILL — Losartan Potassium Tab 100 MG: ORAL | 90 days supply | Qty: 90 | Fill #0 | Status: AC

## 2021-07-09 ENCOUNTER — Other Ambulatory Visit: Payer: Self-pay

## 2021-07-09 ENCOUNTER — Other Ambulatory Visit: Payer: Self-pay | Admitting: Internal Medicine

## 2021-07-09 DIAGNOSIS — F419 Anxiety disorder, unspecified: Secondary | ICD-10-CM

## 2021-07-09 MED ORDER — ALBUTEROL SULFATE HFA 108 (90 BASE) MCG/ACT IN AERS
INHALATION_SPRAY | RESPIRATORY_TRACT | 10 refills | Status: DC
  Filled 2021-07-09: qty 18, 30d supply, fill #0

## 2021-07-09 MED ORDER — ALPRAZOLAM 0.25 MG PO TABS
ORAL_TABLET | ORAL | 5 refills | Status: DC
  Filled 2021-07-09 – 2021-07-23 (×2): qty 30, 60d supply, fill #0
  Filled 2021-11-08: qty 30, 60d supply, fill #1

## 2021-07-09 NOTE — Telephone Encounter (Signed)
RX Refill:xanax Last Seen:11-28-20 Last ordered:11-28-20

## 2021-07-10 ENCOUNTER — Other Ambulatory Visit: Payer: Self-pay

## 2021-07-15 ENCOUNTER — Other Ambulatory Visit: Payer: Self-pay

## 2021-07-23 ENCOUNTER — Other Ambulatory Visit: Payer: Self-pay

## 2021-07-23 MED FILL — Levothyroxine Sodium Tab 175 MCG: ORAL | 90 days supply | Qty: 90 | Fill #1 | Status: AC

## 2021-07-30 ENCOUNTER — Other Ambulatory Visit: Payer: Self-pay

## 2021-07-31 IMAGING — MR MR KNEE*R* W/O CM
6 series · 40 of 40 positions shown · non-contrast
Comparison: Radiographs 04/30/2020

CLINICAL DATA: Palpable lesion along the anterior and lateral
aspect of the knee.

EXAM:
MRI OF THE RIGHT KNEE WITHOUT CONTRAST
TECHNIQUE: Multiplanar, multisequence MR imaging of the knee was performed. No
intravenous contrast was administered.

[Series 15: T2 fat-sat · axial · right · 4.0mm · 0.50mm/px · z∈[-100,+25]mm · 6 of 26 slices shown (1 of 3)]
[im 1/26]
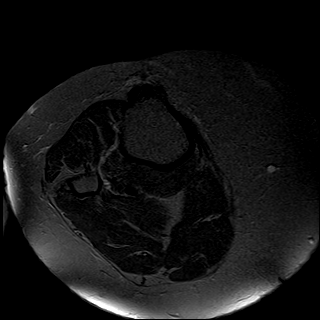
[im 6/26]
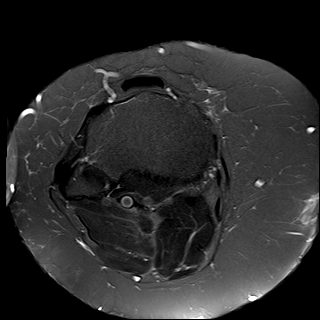
[im 11/26]
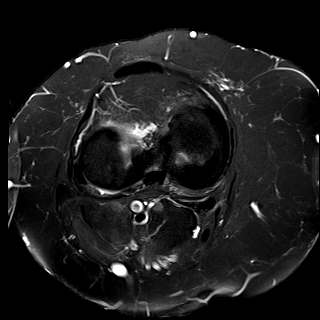
[im 16/26]
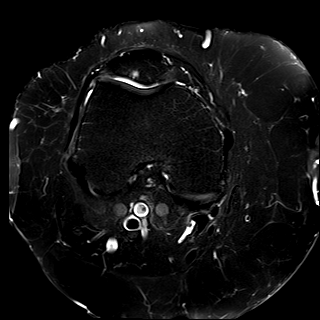
[im 21/26]
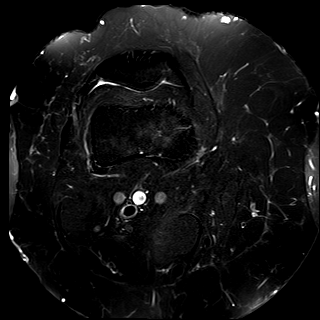
[im 26/26]
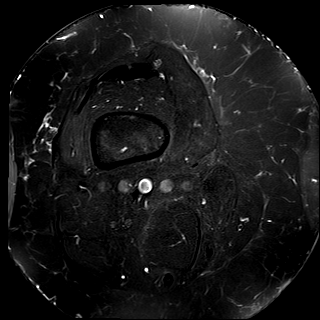

[Series 16: T1 · coronal · right · 4.0mm · 0.39mm/px · 7 of 32 slices shown]
[im 1/32]
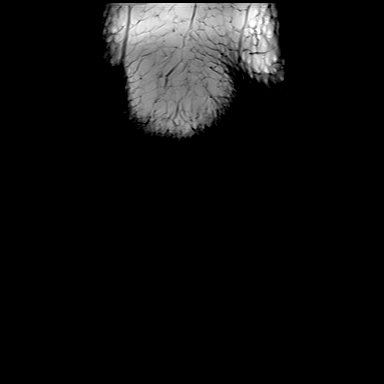
[im 6/32]
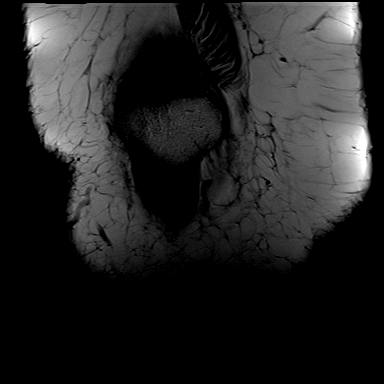
[im 11/32]
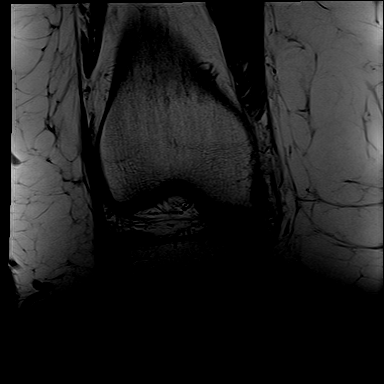
[im 16/32]
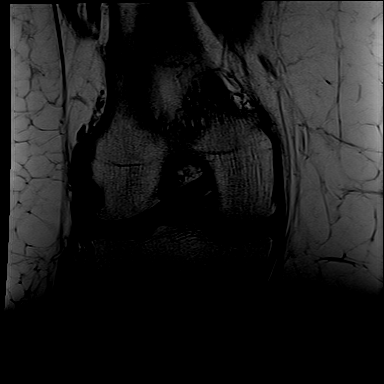
[im 21/32]
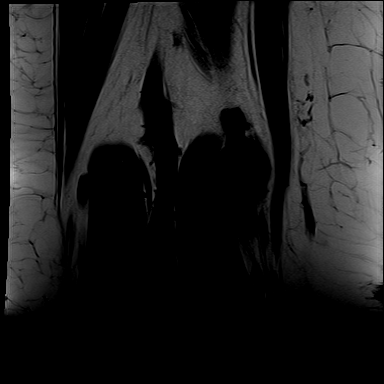
[im 26/32]
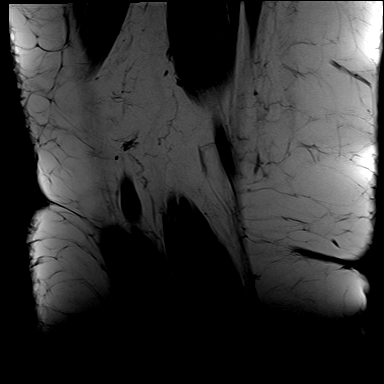
[im 32/32]
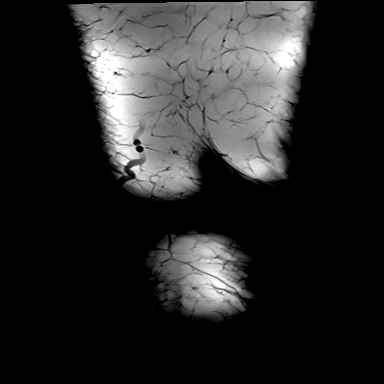

[Series 17: T2 fat-sat · coronal · right · 4.0mm · 0.59mm/px · 6 of 25 slices shown (2 of 3)]
[im 1/25]
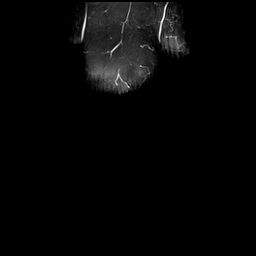
[im 5/25]
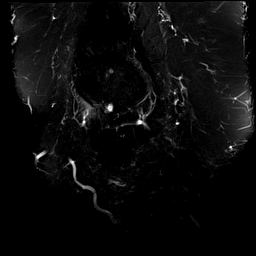
[im 10/25]
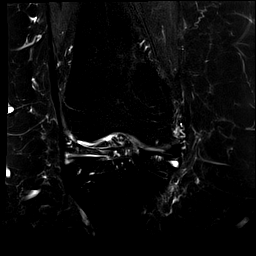
[im 15/25]
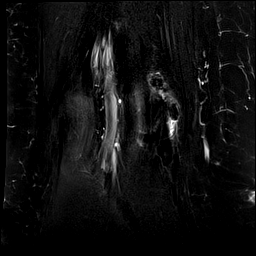
[im 20/25]
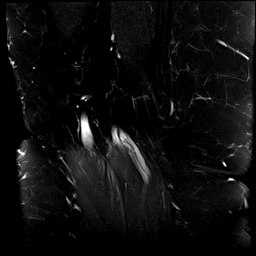
[im 25/25]
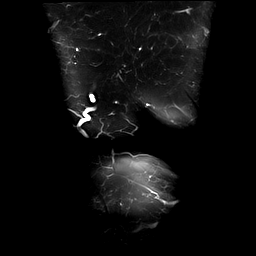

[Series 18: PD fat-sat · coronal · right · 4.0mm · 0.59mm/px · 7 of 32 slices shown (1 of 2)]
[im 1/32]
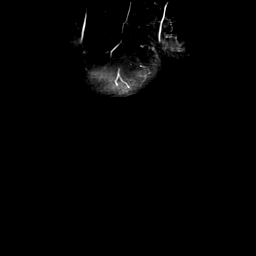
[im 6/32]
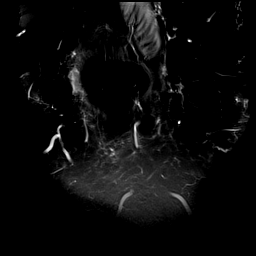
[im 11/32]
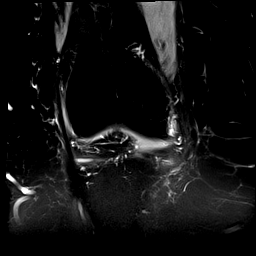
[im 16/32]
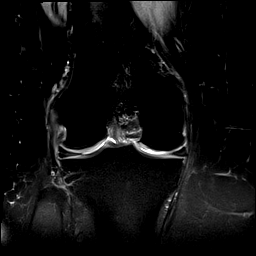
[im 21/32]
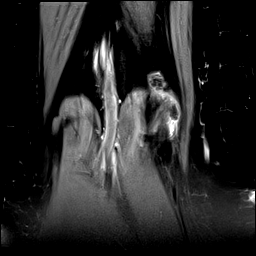
[im 26/32]
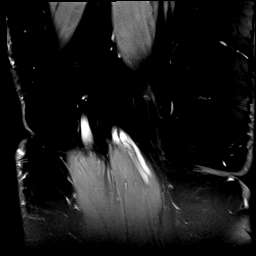
[im 32/32]
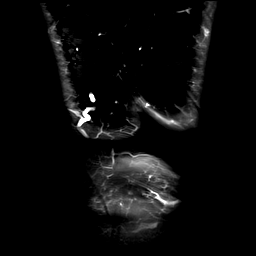

[Series 19: PD fat-sat · sagittal · right · 3.0mm · 0.59mm/px · 6 of 27 slices shown (2 of 2)]
[im 1/27]
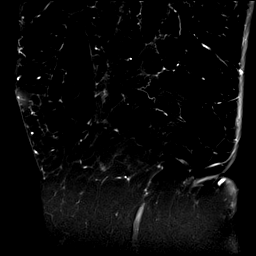
[im 6/27]
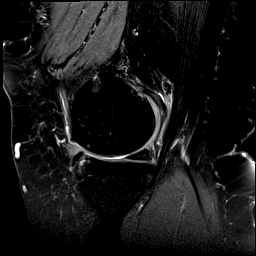
[im 11/27]
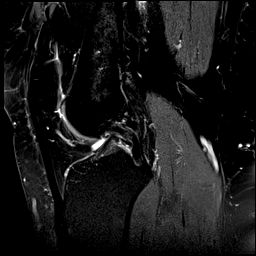
[im 16/27]
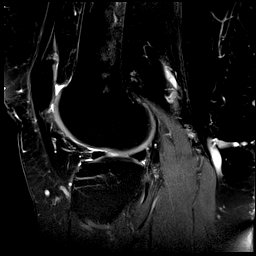
[im 21/27]
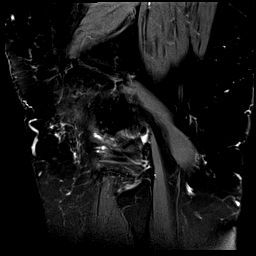
[im 27/27]
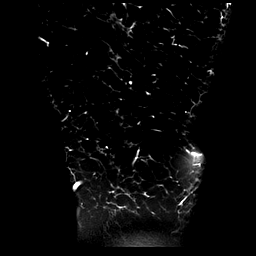

[Series 20: T2 fat-sat · sagittal · right · 3.0mm · 0.59mm/px · 8 of 36 slices shown (3 of 3)]
[im 1/36]
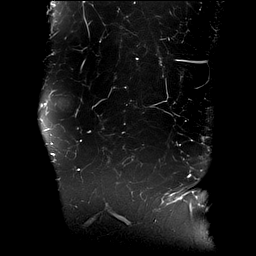
[im 6/36]
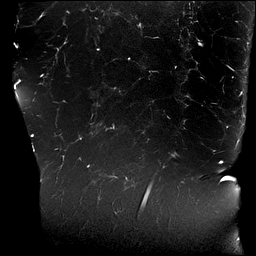
[im 11/36]
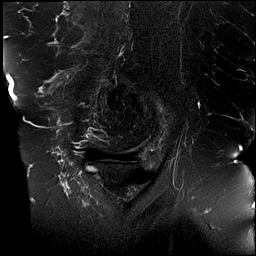
[im 16/36]
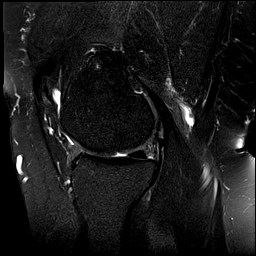
[im 21/36]
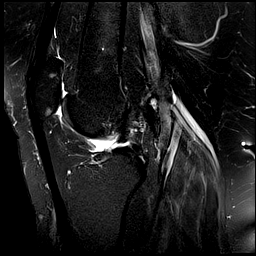
[im 26/36]
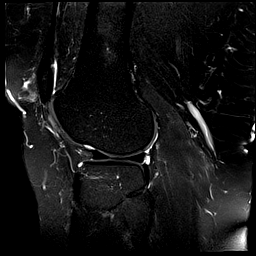
[im 31/36]
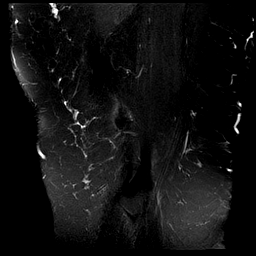
[im 36/36]
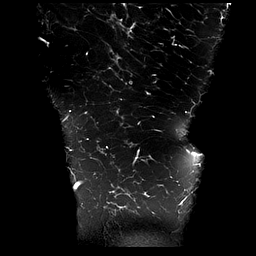

[40 of 40 positions shown; findings below may reference images not displayed]

FINDINGS: Study is limited for evaluating a soft tissue mass as T1 axial
images were not performed and the field-of-view is too small to see
the entire subcutaneous tissues. No IV contrast was used either.

MENISCI

Medial meniscus: Intact. Mild intrasubstance degenerative type
changes.

Lateral meniscus: Intact. Mild intrasubstance degenerative type
changes.

LIGAMENTS

Cruciates:  Intact

Collaterals:  Intact

CARTILAGE

Patellofemoral: Moderate to advanced degenerative chondrosis along
the patellar apex with associated subchondral cystic change.

Medial: Moderate degenerative chondrosis mainly involving the medial
femoral condyle articular cartilage.

Lateral:  Mild degenerative chondrosis. Early spurring changes.

Joint:  No joint effusion.

Popliteal Fossa:  Small Baker's cyst.

Extensor Mechanism: The patella retinacular structures are intact
and the quadriceps and patellar tendons are intact.

Bones:  No acute bony findings.

Other: The patient's palpable abnormality is marked with vitamin-E
capsules. Fairly extensive subcutaneous fat but no discrete lipoma
is identified. No worrisome subcutaneous lesions. The underlying
musculature appears grossly normal.
IMPRESSION: 1. The patient's palpable abnormality corresponds to fairly
extensive subcutaneous fat but no discrete lipoma or worrisome
subcutaneous lesions. However, exam is limited for evaluation as
detailed above.
2. Intact ligamentous structures and no acute bony findings.
3. No meniscal tears.
4. Tricompartmental degenerative changes most significant along the
patellar apex.
5. No joint effusion. Small Baker's cyst.

## 2021-08-04 ENCOUNTER — Other Ambulatory Visit: Payer: Self-pay

## 2021-08-05 ENCOUNTER — Other Ambulatory Visit: Payer: Self-pay

## 2021-08-14 ENCOUNTER — Other Ambulatory Visit: Payer: Self-pay

## 2021-08-26 ENCOUNTER — Other Ambulatory Visit: Payer: Self-pay

## 2021-08-26 MED FILL — Amlodipine Besylate Tab 5 MG (Base Equivalent): ORAL | 90 days supply | Qty: 90 | Fill #1 | Status: AC

## 2021-10-25 MED FILL — Losartan Potassium Tab 100 MG: ORAL | 90 days supply | Qty: 90 | Fill #1 | Status: AC

## 2021-10-26 ENCOUNTER — Encounter: Payer: Self-pay | Admitting: Internal Medicine

## 2021-10-26 ENCOUNTER — Other Ambulatory Visit: Payer: Self-pay

## 2021-10-26 NOTE — Addendum Note (Signed)
Addended by: Orland Mustard on: 10/26/2021 04:14 PM   Modules accepted: Orders

## 2021-11-08 ENCOUNTER — Other Ambulatory Visit: Payer: Self-pay

## 2021-11-09 ENCOUNTER — Other Ambulatory Visit: Payer: Self-pay

## 2021-11-23 ENCOUNTER — Other Ambulatory Visit: Payer: Self-pay

## 2021-11-23 ENCOUNTER — Encounter: Payer: Self-pay | Admitting: Gastroenterology

## 2021-11-27 ENCOUNTER — Other Ambulatory Visit: Payer: Self-pay

## 2021-11-27 ENCOUNTER — Encounter
Admission: RE | Disposition: A | Discharge status: Home / Self Care | Payer: Self-pay | Source: Home / Self Care | Attending: Gastroenterology

## 2021-11-27 ENCOUNTER — Ambulatory Visit: Payer: No Typology Code available for payment source | Admitting: Anesthesiology

## 2021-11-27 ENCOUNTER — Ambulatory Visit
Admission: RE | Admit: 2021-11-27 | Discharge: 2021-11-27 | Disposition: A | Discharge status: Home / Self Care | Payer: No Typology Code available for payment source | Attending: Gastroenterology | Admitting: Gastroenterology

## 2021-11-27 ENCOUNTER — Encounter: Payer: Self-pay | Admitting: Gastroenterology

## 2021-11-27 DIAGNOSIS — Z86010 Personal history of colonic polyps: Secondary | ICD-10-CM | POA: Diagnosis not present

## 2021-11-27 DIAGNOSIS — K640 First degree hemorrhoids: Secondary | ICD-10-CM | POA: Diagnosis not present

## 2021-11-27 DIAGNOSIS — J45909 Unspecified asthma, uncomplicated: Secondary | ICD-10-CM | POA: Diagnosis not present

## 2021-11-27 DIAGNOSIS — K5730 Diverticulosis of large intestine without perforation or abscess without bleeding: Secondary | ICD-10-CM | POA: Diagnosis not present

## 2021-11-27 DIAGNOSIS — Z1211 Encounter for screening for malignant neoplasm of colon: Secondary | ICD-10-CM | POA: Insufficient documentation

## 2021-11-27 DIAGNOSIS — I129 Hypertensive chronic kidney disease with stage 1 through stage 4 chronic kidney disease, or unspecified chronic kidney disease: Secondary | ICD-10-CM | POA: Diagnosis not present

## 2021-11-27 DIAGNOSIS — K219 Gastro-esophageal reflux disease without esophagitis: Secondary | ICD-10-CM | POA: Diagnosis not present

## 2021-11-27 DIAGNOSIS — N189 Chronic kidney disease, unspecified: Secondary | ICD-10-CM | POA: Diagnosis not present

## 2021-11-27 DIAGNOSIS — K621 Rectal polyp: Secondary | ICD-10-CM | POA: Diagnosis not present

## 2021-11-27 DIAGNOSIS — E039 Hypothyroidism, unspecified: Secondary | ICD-10-CM | POA: Insufficient documentation

## 2021-11-27 HISTORY — PX: POLYPECTOMY: SHX5525

## 2021-11-27 HISTORY — PX: COLONOSCOPY: SHX5424

## 2021-11-27 SURGERY — COLONOSCOPY
Anesthesia: General | Site: Rectum

## 2021-11-27 MED ORDER — LACTATED RINGERS IV SOLN: INTRAVENOUS | Status: DC

## 2021-11-27 MED ORDER — STERILE WATER FOR IRRIGATION IR SOLN
Status: DC | PRN
  Administered 2021-11-27: 1

## 2021-11-27 MED ORDER — SODIUM CHLORIDE 0.9 % IV SOLN: INTRAVENOUS | Status: DC

## 2021-11-27 MED ORDER — LIDOCAINE HCL (CARDIAC) PF 100 MG/5ML IV SOSY
PREFILLED_SYRINGE | INTRAVENOUS | Status: DC | PRN
  Administered 2021-11-27: 30 mg via INTRAVENOUS

## 2021-11-27 MED ORDER — PROPOFOL 10 MG/ML IV BOLUS
INTRAVENOUS | Status: DC | PRN
  Administered 2021-11-27: 40 mg via INTRAVENOUS
  Administered 2021-11-27: 30 mg via INTRAVENOUS
  Administered 2021-11-27: 40 mg via INTRAVENOUS
  Administered 2021-11-27: 30 mg via INTRAVENOUS
  Administered 2021-11-27: 40 mg via INTRAVENOUS
  Administered 2021-11-27: 150 mg via INTRAVENOUS

## 2021-11-27 SURGICAL SUPPLY — 8 items
GOWN CVR UNV OPN BCK APRN NK (MISCELLANEOUS) ×2 IMPLANT
GOWN ISOL THUMB LOOP REG UNIV (MISCELLANEOUS) ×6
KIT PRC NS LF DISP ENDO (KITS) ×1 IMPLANT
KIT PROCEDURE OLYMPUS (KITS) ×3
MANIFOLD NEPTUNE II (INSTRUMENTS) ×3 IMPLANT
SNARE COLD EXACTO (MISCELLANEOUS) ×2 IMPLANT
TRAP ETRAP POLY (MISCELLANEOUS) ×2 IMPLANT
WATER STERILE IRR 250ML POUR (IV SOLUTION) ×3 IMPLANT

## 2021-11-27 NOTE — Anesthesia Procedure Notes (Signed)
Date/Time: 11/27/2021 9:03 AM Performed by: Cameron Ali, CRNA Pre-anesthesia Checklist: Patient identified, Emergency Drugs available, Suction available, Timeout performed and Patient being monitored Patient Re-evaluated:Patient Re-evaluated prior to induction Oxygen Delivery Method: Nasal cannula Placement Confirmation: positive ETCO2

## 2021-11-27 NOTE — Anesthesia Postprocedure Evaluation (Signed)
Anesthesia Post Note  Patient: Barbara Blake  Procedure(s) Performed: COLONOSCOPY WITH BIOPSY (Rectum) POLYPECTOMY (Rectum)     Patient location during evaluation: PACU Anesthesia Type: General Level of consciousness: awake and alert Pain management: pain level controlled Vital Signs Assessment: post-procedure vital signs reviewed and stable Respiratory status: spontaneous breathing, nonlabored ventilation, respiratory function stable and patient connected to nasal cannula oxygen Cardiovascular status: blood pressure returned to baseline and stable Postop Assessment: no apparent nausea or vomiting Anesthetic complications: no   No notable events documented.  Trecia Rogers

## 2021-11-27 NOTE — Anesthesia Preprocedure Evaluation (Signed)
Anesthesia Evaluation  Patient identified by MRN, date of birth, ID band Patient awake    Reviewed: Allergy & Precautions, H&P , NPO status , Patient's Chart, lab work & pertinent test results, reviewed documented beta blocker date and time   Airway Mallampati: III  TM Distance: >3 FB Neck ROM: full    Dental no notable dental hx.    Pulmonary asthma ,    Pulmonary exam normal breath sounds clear to auscultation       Cardiovascular Exercise Tolerance: Good hypertension, negative cardio ROS Normal cardiovascular exam Rhythm:regular Rate:Normal     Neuro/Psych negative neurological ROS  negative psych ROS   GI/Hepatic Neg liver ROS, GERD  Medicated and Controlled,  Endo/Other  Hypothyroidism   Renal/GU CRFRenal disease  negative genitourinary   Musculoskeletal   Abdominal   Peds  Hematology negative hematology ROS (+)   Anesthesia Other Findings   Reproductive/Obstetrics negative OB ROS                             Anesthesia Physical Anesthesia Plan  ASA: 2  Anesthesia Plan: General   Post-op Pain Management:    Induction:   PONV Risk Score and Plan:   Airway Management Planned:   Additional Equipment:   Intra-op Plan:   Post-operative Plan:   Informed Consent: I have reviewed the patients History and Physical, chart, labs and discussed the procedure including the risks, benefits and alternatives for the proposed anesthesia with the patient or authorized representative who has indicated his/her understanding and acceptance.     Dental Advisory Given  Plan Discussed with: CRNA and Anesthesiologist  Anesthesia Plan Comments:         Anesthesia Quick Evaluation

## 2021-11-27 NOTE — H&P (Signed)
Barbara Lame, MD [facility] [address] [place], [place] [address] [phone] Fax : [phone]  Primary Care Physician:  Blake, Barbara Glow, MD Primary Gastroenterologist:  Dr. Allen Blake  Pre-Procedure History & Physical: HPI:  Barbara Blake is a 65 y.o. female is here for an colonoscopy.   Past Medical History:  Diagnosis Date   Anxiety    Asthma    Gallstones    History of kidney stones    HTN (hypertension)    Hypothyroidism    Low back pain    Lung nodule    Obesity    Vitamin D deficiency     Past Surgical History:  Procedure Laterality Date   APPENDECTOMY     DILATION AND CURETTAGE OF UTERUS     EXTRACORPOREAL SHOCK WAVE LITHOTRIPSY Left 06/22/2018   Procedure: EXTRACORPOREAL SHOCK WAVE LITHOTRIPSY (ESWL);  Surgeon: Barbara Sons, MD;  Location: [facility] ORS;  Service: Urology;  Laterality: Left;   LIPOMA EXCISION     MASS EXCISION Right 07/16/2020   Procedure: EXCISION OF A SUBCUTANEOUS SOFT TISSUE MASS IN THE ANTERIOR ASPECT OF RIGHT KNEE.;  Surgeon: Barbara Mull, MD;  Location: [facility] ORS;  Service: Orthopedics;  Laterality: Right;    Prior to Admission medications   Medication Sig Start Date End Date Taking? Authorizing Provider  albuterol (PROAIR HFA) 108 (90 Base) MCG/ACT inhaler Inhale 1-2 puffs into the lungs every 6 (six) hours as needed for wheezing or shortness of breath. 11/28/20  Yes Blake, Barbara Glow, MD  albuterol (VENTOLIN HFA) 108 (90 Base) MCG/ACT inhaler Inhale 1 to 2 puffs by mouth every 6 hours as needed for wheezing or shortness of breath 11/28/20  Yes Blake, Barbara Glow, MD  ALPRAZolam Duanne Moron) 0.25 MG tablet TAKE 1/2 TABLET BY MOUTH DAILY AS NEEDED FOR ANXIETY. 07/09/21 01/05/22 Yes Blake, Barbara Glow, MD  amLODipine (NORVASC) 5 MG tablet TAKE 1 TABLET (5 MG TOTAL) BY MOUTH EVERY MORNING. 11/28/20 12/16/21 Yes Blake, Barbara Glow, MD  budesonide-formoterol (SYMBICORT) 80-4.5 MCG/ACT inhaler Inhale 2 puffs into  the lungs 2 (two) times daily. Rinse mouth 11/28/20  Yes Blake, Barbara Glow, MD  Cholecalciferol (VITAMIN D3) 50 MCG (2000 UT) capsule Take 2,000 Units by mouth daily.    Yes [provider]  hydrochlorothiazide (HYDRODIURIL) 25 MG tablet Take 1 tablet (25 mg total) by mouth daily. 11/28/20  Yes Blake, Barbara Glow, MD  hydrochlorothiazide (HYDRODIURIL) 25 MG tablet Take by mouth daily. 06/11/21 06/11/22 Yes Blake, Barbara Glow, MD  ibuprofen (ADVIL) 200 MG tablet Take 200 mg by mouth every 8 (eight) hours as needed for moderate pain.   Yes [provider]  levothyroxine (SYNTHROID) 175 MCG tablet Take 1 tablet (175 mcg total) by mouth daily before breakfast. 11/28/20  Yes Blake, Barbara Glow, MD  losartan (COZAAR) 100 MG tablet TAKE 1 TABLET (100 MG TOTAL) BY MOUTH DAILY. 11/28/20 01/24/22 Yes Blake, Barbara Glow, MD  naproxen sodium (ALEVE) 220 MG tablet Take 440 mg by mouth daily as needed (pain).   Yes [provider]  sodium chloride (OCEAN) 0.65 % SOLN nasal spray Place 1 spray into both nostrils as needed for congestion. Before atrovent 11/23/19  Yes Blake, Barbara Glow, MD  levothyroxine (SYNTHROID) 175 MCG tablet TAKE 1 TABLET BY MOUTH DAILY BEFORE BREAKFAST. 08/01/20 10/27/21  Blake, Barbara Glow, MD    Allergies as of 10/29/2021   (No Known Allergies)    Family History  Problem Relation Age of Onset   Alzheimer's disease Mother  Hypertension Mother    Cancer Mother 51       colon   Lupus Daughter    Heart attack Maternal Grandmother    Breast cancer Other    Breast cancer Maternal Aunt 1   Bladder Cancer Neg Hx    Kidney cancer Neg Hx     Social History   Socioeconomic History   Marital status: Married    Spouse name: Not on file   Number of children: 2   Years of education: Not on file   Highest education level: Not on file  Occupational History   Occupation: Investment banker, 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   Works at [employer] with GI.    From [facility] [employer]   As of 11/2019 married 35 years    2 kids       Regular Exercise -  Not at this time   Daily Caffeine Use:  1 cup hot tea daily         Social Determinants of Health   Financial Resource Strain: Not on file  Food Insecurity: Not on file  Transportation Needs: Not on file  Physical Activity: Not on file  Stress: Not on file  Social Connections: Not on file  Intimate Partner Violence: Not on file    Review of Systems: See HPI, otherwise negative ROS  Physical Exam: BP (!) 131/98    Pulse 86    Temp 97.7 F (36.5 C) (Temporal)    Resp 16    Ht 5\' 1"  (1.549 m)    Wt 103.3 kg    SpO2 98%    BMI 43.02 kg/m  General:   Alert,  pleasant and cooperative in NAD Head:  Normocephalic and atraumatic. Neck:  Supple; no masses or thyromegaly. Lungs:  Clear throughout to auscultation.    Heart:  Regular rate and rhythm. Abdomen:  Soft, nontender and nondistended. Normal bowel sounds, without guarding, and without rebound.   Neurologic:  Alert and  oriented x4;  grossly normal neurologically.  Impression/Plan: Barbara Blake is here for an colonoscopy to be performed for a history of adenomatous polyps on 2015   Risks, benefits, limitations, and alternatives regarding  colonoscopy have been reviewed with the patient.  Questions have been answered.  All parties agreeable.   Barbara Lame, MD  11/27/2021, 8:57 AM

## 2021-11-27 NOTE — Transfer of Care (Signed)
Immediate Anesthesia Transfer of Care Note  Patient: Barbara Blake  Procedure(s) Performed: COLONOSCOPY WITH BIOPSY (Rectum) POLYPECTOMY (Rectum)  Patient Location: PACU  Anesthesia Type: General  Level of Consciousness: awake, alert  and patient cooperative  Airway and Oxygen Therapy: Patient Spontanous Breathing and Patient connected to supplemental oxygen  Post-op Assessment: Post-op Vital signs reviewed, Patient's Cardiovascular Status Stable, Respiratory Function Stable, Patent Airway and No signs of Nausea or vomiting  Post-op Vital Signs: Reviewed and stable  Complications: No notable events documented.

## 2021-11-27 NOTE — Op Note (Signed)
[facility] Patient Name: Barbara Blake Procedure Date: 11/27/2021 8:54 AM MRN: [id number] Account #: [id number] Date of Birth: 06-02-1956 Admit Type: Outpatient Age: 65 Room: [facility] OR ROOM 01 Gender: Female Note Status: Finalized Instrument Name: [id number] Procedure:             Colonoscopy Indications:           High risk colon cancer surveillance: Personal history                         of colonic polyps Providers:             Lucilla Lame MD, MD Referring MD:          Nino Glow Mclean-Scocuzza MD, MD (Referring MD) Medicines:             Propofol per Anesthesia Complications:         No immediate complications. Procedure:             Pre-Anesthesia Assessment:                        - Prior to the procedure, a History and Physical was                         performed, and patient medications and allergies were                         reviewed. The patient's tolerance of previous                         anesthesia was also reviewed. The risks and benefits                         of the procedure and the sedation options and risks                         were discussed with the patient. All questions were                         answered, and informed consent was obtained. Prior                         Anticoagulants: The patient has taken no previous                         anticoagulant or antiplatelet agents. ASA Grade                         Assessment: II - A patient with mild systemic disease.                         After reviewing the risks and benefits, the patient                         was deemed in satisfactory condition to undergo the                         procedure.  After obtaining informed consent, the colonoscope was                         passed under direct vision. Throughout the procedure,                         the patient's blood pressure, pulse, and oxygen                         saturations were  monitored continuously. The                         Colonoscope was introduced through the anus and                         advanced to the the cecum, identified by appendiceal                         orifice and ileocecal valve. The colonoscopy was                         performed without difficulty. The patient tolerated                         the procedure well. The quality of the bowel                         preparation was excellent. Findings:      The perianal and digital rectal examinations were normal.      A 4 mm polyp was found in the rectum. The polyp was sessile. The polyp       was removed with a cold snare. Resection and retrieval were complete.      Multiple small-mouthed diverticula were found in the sigmoid colon.      Non-bleeding internal hemorrhoids were found during retroflexion. The       hemorrhoids were Grade I (internal hemorrhoids that do not prolapse). Impression:            - One 4 mm polyp in the rectum, removed with a cold                         snare. Resected and retrieved.                        - Diverticulosis in the sigmoid colon.                        - Non-bleeding internal hemorrhoids. Recommendation:        - Discharge patient to home.                        - Resume previous diet.                        - Continue present medications.                        - Await pathology results.                        -  Repeat colonoscopy in 7 years for surveillance. Procedure Code(s):     --- Professional ---                        [phone], Colonoscopy, flexible; with removal of                         tumor(s), polyp(s), or other lesion(s) by snare                         technique Diagnosis Code(s):     --- Professional ---                        Z86.010, Personal history of colonic polyps                        K62.1, Rectal polyp CPT copyright 2019 American Medical Association. All rights reserved. The codes documented in this report are preliminary  and upon coder review may  be revised to meet current compliance requirements. Lucilla Lame MD, MD 11/27/2021 9:24:15 AM This report has been signed electronically. Number of Addenda: 0 Note Initiated On: 11/27/2021 8:54 AM Scope Withdrawal Time: 0 hours 8 minutes 7 seconds  Total Procedure Duration: 0 hours 14 minutes 24 seconds  Estimated Blood Loss:  Estimated blood loss: none.      [facility]

## 2021-11-30 ENCOUNTER — Encounter: Payer: Self-pay | Admitting: Gastroenterology

## 2021-12-01 ENCOUNTER — Encounter: Payer: Self-pay | Admitting: Gastroenterology

## 2021-12-01 LAB — SURGICAL PATHOLOGY

## 2021-12-02 ENCOUNTER — Ambulatory Visit (INDEPENDENT_AMBULATORY_CARE_PROVIDER_SITE_OTHER): Payer: No Typology Code available for payment source | Admitting: Internal Medicine

## 2021-12-02 ENCOUNTER — Encounter: Payer: Self-pay | Admitting: Internal Medicine

## 2021-12-02 ENCOUNTER — Other Ambulatory Visit (HOSPITAL_COMMUNITY)
Admission: RE | Admit: 2021-12-02 | Discharge: 2021-12-02 | Disposition: A | Discharge status: Home / Self Care | Payer: No Typology Code available for payment source | Source: Ambulatory Visit | Attending: Internal Medicine | Admitting: Internal Medicine

## 2021-12-02 ENCOUNTER — Other Ambulatory Visit: Payer: Self-pay

## 2021-12-02 VITALS — BP 134/80 | HR 64 | Temp 97.9°F | Ht 60.39 in | Wt 231.2 lb

## 2021-12-02 DIAGNOSIS — R7303 Prediabetes: Secondary | ICD-10-CM | POA: Diagnosis not present

## 2021-12-02 DIAGNOSIS — E039 Hypothyroidism, unspecified: Secondary | ICD-10-CM | POA: Diagnosis not present

## 2021-12-02 DIAGNOSIS — Z1329 Encounter for screening for other suspected endocrine disorder: Secondary | ICD-10-CM | POA: Diagnosis not present

## 2021-12-02 DIAGNOSIS — Z1389 Encounter for screening for other disorder: Secondary | ICD-10-CM | POA: Diagnosis not present

## 2021-12-02 DIAGNOSIS — Z0000 Encounter for general adult medical examination without abnormal findings: Secondary | ICD-10-CM | POA: Diagnosis not present

## 2021-12-02 DIAGNOSIS — E559 Vitamin D deficiency, unspecified: Secondary | ICD-10-CM

## 2021-12-02 DIAGNOSIS — J309 Allergic rhinitis, unspecified: Secondary | ICD-10-CM | POA: Diagnosis not present

## 2021-12-02 DIAGNOSIS — Z124 Encounter for screening for malignant neoplasm of cervix: Secondary | ICD-10-CM | POA: Diagnosis present

## 2021-12-02 DIAGNOSIS — R87610 Atypical squamous cells of undetermined significance on cytologic smear of cervix (ASC-US): Secondary | ICD-10-CM

## 2021-12-02 DIAGNOSIS — I10 Essential (primary) hypertension: Secondary | ICD-10-CM | POA: Diagnosis not present

## 2021-12-02 DIAGNOSIS — F419 Anxiety disorder, unspecified: Secondary | ICD-10-CM | POA: Diagnosis not present

## 2021-12-02 DIAGNOSIS — Z0100 Encounter for examination of eyes and vision without abnormal findings: Secondary | ICD-10-CM

## 2021-12-02 DIAGNOSIS — Z1231 Encounter for screening mammogram for malignant neoplasm of breast: Secondary | ICD-10-CM

## 2021-12-02 DIAGNOSIS — J4520 Mild intermittent asthma, uncomplicated: Secondary | ICD-10-CM

## 2021-12-02 LAB — LIPID PANEL
Cholesterol: 197 mg/dL (ref 0–200)
HDL: 60.1 mg/dL (ref >39.00)
LDL Cholesterol: 120 mg/dL — ABNORMAL HIGH (ref 0–99)
NonHDL: 137.25
Total CHOL/HDL Ratio: 3
Triglycerides: 84 mg/dL (ref 0.0–149.0)
VLDL: 16.8 mg/dL (ref 0.0–40.0)

## 2021-12-02 LAB — CBC WITH DIFFERENTIAL/PLATELET
Basophils Absolute: 0.1 10*3/uL (ref 0.0–0.1)
Basophils Relative: 1.2 % (ref 0.0–3.0)
Eosinophils Absolute: 0.2 10*3/uL (ref 0.0–0.7)
Eosinophils Relative: 2.7 % (ref 0.0–5.0)
HCT: 44.6 % (ref 36.0–46.0)
Hemoglobin: 14.7 g/dL (ref 12.0–15.0)
Lymphocytes Relative: 31.1 % (ref 12.0–46.0)
Lymphs Abs: 1.9 10*3/uL (ref 0.7–4.0)
MCHC: 33 g/dL (ref 30.0–36.0)
MCV: 92.8 fl (ref 78.0–100.0)
Monocytes Absolute: 0.4 10*3/uL (ref 0.1–1.0)
Monocytes Relative: 6.7 % (ref 3.0–12.0)
Neutro Abs: 3.6 10*3/uL (ref 1.4–7.7)
Neutrophils Relative %: 58.3 % (ref 43.0–77.0)
Platelets: 239 10*3/uL (ref 150.0–400.0)
RBC: 4.81 Mil/uL (ref 3.87–5.11)
RDW: 14.3 % (ref 11.5–15.5)
WBC: 6.2 10*3/uL (ref 4.0–10.5)

## 2021-12-02 LAB — URINALYSIS, ROUTINE W REFLEX MICROSCOPIC
Bilirubin Urine: NEGATIVE
Hgb urine dipstick: NEGATIVE
Ketones, ur: NEGATIVE
Leukocytes,Ua: NEGATIVE
Nitrite: NEGATIVE
Specific Gravity, Urine: 1.02 (ref 1.000–1.030)
Total Protein, Urine: NEGATIVE
Urine Glucose: NEGATIVE
Urobilinogen, UA: 0.2 (ref 0.0–1.0)
pH: 6.5 (ref 5.0–8.0)

## 2021-12-02 LAB — TSH: TSH: 5.21 u[IU]/mL (ref 0.35–5.50)

## 2021-12-02 LAB — COMPREHENSIVE METABOLIC PANEL
ALT: 35 U/L (ref 0–35)
AST: 22 U/L (ref 0–37)
Albumin: 4.1 g/dL (ref 3.5–5.2)
Alkaline Phosphatase: 70 U/L (ref 39–117)
BUN: 18 mg/dL (ref 6–23)
CO2: 30 mEq/L (ref 19–32)
Calcium: 9.7 mg/dL (ref 8.4–10.5)
Chloride: 101 mEq/L (ref 96–112)
Creatinine, Ser: 0.89 mg/dL (ref 0.40–1.20)
GFR: 68.57 mL/min (ref >60.00)
Glucose, Bld: 87 mg/dL (ref 70–99)
Potassium: 4 mEq/L (ref 3.5–5.1)
Sodium: 139 mEq/L (ref 135–145)
Total Bilirubin: 1.3 mg/dL — ABNORMAL HIGH (ref 0.2–1.2)
Total Protein: 7 g/dL (ref 6.0–8.3)

## 2021-12-02 LAB — VITAMIN D 25 HYDROXY (VIT D DEFICIENCY, FRACTURES): VITD: 28.77 ng/mL — ABNORMAL LOW (ref 30.00–100.00)

## 2021-12-02 LAB — HEMOGLOBIN A1C: Hgb A1c MFr Bld: 5.7 % (ref 4.6–6.5)

## 2021-12-02 MED ORDER — ALPRAZOLAM 0.25 MG PO TABS
ORAL_TABLET | ORAL | 5 refills | Status: DC
  Filled 2021-12-02: qty 30, 60d supply, fill #0
  Filled 2022-01-30: qty 30, 60d supply, fill #1
  Filled 2022-05-30: qty 30, 60d supply, fill #2

## 2021-12-02 MED ORDER — IPRATROPIUM BROMIDE 0.06 % NA SOLN
2.0000 | Freq: Three times a day (TID) | NASAL | 11 refills | Status: DC | PRN
  Filled 2021-12-02: qty 15, 25d supply, fill #0
  Filled 2022-12-01: qty 15, 25d supply, fill #1

## 2021-12-02 MED ORDER — BUDESONIDE-FORMOTEROL FUMARATE 80-4.5 MCG/ACT IN AERO
2.0000 | INHALATION_SPRAY | Freq: Two times a day (BID) | RESPIRATORY_TRACT | 11 refills | Status: DC
  Filled 2021-12-02: qty 10.2, 30d supply, fill #0

## 2021-12-02 MED ORDER — ALBUTEROL SULFATE HFA 108 (90 BASE) MCG/ACT IN AERS
1.0000 | INHALATION_SPRAY | Freq: Four times a day (QID) | RESPIRATORY_TRACT | 11 refills | Status: DC | PRN
  Filled 2021-12-02: qty 18, 25d supply, fill #0

## 2021-12-02 MED ORDER — LOSARTAN POTASSIUM 100 MG PO TABS
ORAL_TABLET | Freq: Every day | ORAL | 3 refills | Status: DC
  Filled 2021-12-02 – 2022-02-14 (×2): qty 90, 90d supply, fill #0
  Filled 2022-05-30: qty 90, 90d supply, fill #1
  Filled 2022-08-28: qty 90, 90d supply, fill #2

## 2021-12-02 MED ORDER — LEVOTHYROXINE SODIUM 175 MCG PO TABS
ORAL_TABLET | Freq: Every day | ORAL | 3 refills | Status: DC
  Filled 2021-12-02: qty 90, 90d supply, fill #0
  Filled 2022-04-05: qty 90, 90d supply, fill #1
  Filled 2022-07-21: qty 90, 90d supply, fill #2
  Filled 2022-10-30: qty 90, 90d supply, fill #3

## 2021-12-02 NOTE — Progress Notes (Signed)
Chief Complaint  Patient presents with   Annual Exam   Annual  1. Htn sl elevated today on norvasc 5 mg qd hctz 25 mg qd losartan 100 mg qd  2. Prediabetes 3. Asthma controlled   Review of Systems  Constitutional:  Negative for weight loss.  HENT:  Negative for hearing loss.   Eyes:  Negative for blurred vision.  Respiratory:  Negative for shortness of breath.   Cardiovascular:  Negative for chest pain.  Gastrointestinal:  Negative for abdominal pain and blood in stool.  Genitourinary:  Negative for dysuria.  Musculoskeletal:  Negative for falls and joint pain.  Skin:  Negative for rash.  Neurological:  Negative for headaches.  Psychiatric/Behavioral:  Negative for depression.   Past Medical History:  Diagnosis Date   Anxiety    Asthma    Gallstones    History of kidney stones    HTN (hypertension)    Hypothyroidism    Low back pain    Lung nodule    Obesity    Vitamin D deficiency    Past Surgical History:  Procedure Laterality Date   APPENDECTOMY     COLONOSCOPY N/A 11/27/2021   Procedure: COLONOSCOPY WITH BIOPSY;  Surgeon: Lucilla Lame, MD;  Location: [place];  Service: Endoscopy;  Laterality: N/A;   DILATION AND CURETTAGE OF UTERUS     EXTRACORPOREAL SHOCK WAVE LITHOTRIPSY Left 06/22/2018   Procedure: EXTRACORPOREAL SHOCK WAVE LITHOTRIPSY (ESWL);  Surgeon: Abbie Sons, MD;  Location: [facility];  Service: Urology;  Laterality: Left;   LIPOMA EXCISION     MASS EXCISION Right 07/16/2020   Procedure: EXCISION OF A SUBCUTANEOUS SOFT TISSUE MASS IN THE ANTERIOR ASPECT OF RIGHT KNEE.;  Surgeon: Corky Mull, MD;  Location: [facility];  Service: Orthopedics;  Laterality: Right;   POLYPECTOMY N/A 11/27/2021   Procedure: POLYPECTOMY;  Surgeon: Lucilla Lame, MD;  Location: [place];  Service: Endoscopy;  Laterality: N/A;   Family History  Problem Relation Age of Onset   Alzheimer's disease Mother    Hypertension Mother    Cancer Mother 64        colon   Lupus Daughter    Heart attack Maternal Grandmother    Breast cancer Other    Breast cancer Maternal Aunt 26   Bladder Cancer Neg Hx    Kidney cancer Neg Hx    Social History   Socioeconomic History   Marital status: Married    Spouse name: Not on file   Number of children: 2   Years of education: Not on file   Highest education level: Not on file  Occupational History   Occupation: Investment banker, 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   Works at [employer] with GI.    From [facility]   As of 11/2019 married 39 years    2 kids       Regular Exercise -  Not at this time   Daily Caffeine Use:  1 cup hot tea daily  Social Determinants of Health   Financial Resource Strain: Not on file  Food Insecurity: Not on file  Transportation Needs: Not on file  Physical Activity: Not on file  Stress: Not on file  Social Connections: Not on file  Intimate Partner Violence: Not on file   Current Meds  Medication Sig   albuterol (VENTOLIN HFA) 108 (90 Base) MCG/ACT inhaler Inhale 1 to 2 puffs by mouth every 6 hours as needed for wheezing or shortness of breath   amLODipine (NORVASC) 5 MG tablet TAKE 1 TABLET (5 MG TOTAL) BY MOUTH EVERY MORNING.   hydrochlorothiazide (HYDRODIURIL) 25 MG tablet Take 1 tablet (25 mg total) by mouth daily.   ibuprofen (ADVIL) 200 MG tablet Take 200 mg by mouth every 8 (eight) hours as needed for moderate pain.   naproxen sodium (ALEVE) 220 MG tablet Take 440 mg by mouth daily as needed (pain).   [DISCONTINUED] albuterol (PROAIR HFA) 108 (90 Base) MCG/ACT inhaler Inhale 1-2 puffs into  the lungs every 6 (six) hours as needed for wheezing or shortness of breath.   [DISCONTINUED] ALPRAZolam (XANAX) 0.25 MG tablet TAKE 1/2 TABLET BY MOUTH DAILY AS NEEDED FOR ANXIETY.   [DISCONTINUED] budesonide-formoterol (SYMBICORT) 80-4.5 MCG/ACT inhaler Inhale 2 puffs into the lungs 2 (two) times daily. Rinse mouth   [DISCONTINUED] ipratropium (ATROVENT) 0.06 % nasal spray    [DISCONTINUED] levothyroxine (SYNTHROID) 175 MCG tablet Take 1 tablet (175 mcg total) by mouth daily before breakfast.   [DISCONTINUED] losartan (COZAAR) 100 MG tablet TAKE 1 TABLET (100 MG TOTAL) BY MOUTH DAILY.   No Known Allergies Recent Results (from the past 2160 hour(s))  Surgical pathology     Status: None   Collection Time: 11/27/21  9:19 AM  Result Value Ref Range   SURGICAL PATHOLOGY      SURGICAL PATHOLOGY CASE: [id number] PATIENT: [facility] Surgical Pathology Report     Specimen Submitted: A. Rectum polyp; cold snare  Clinical History: Colon cancer screening and gastro-esophageal disease      DIAGNOSIS: A. RECTUM POLYP; COLD SNARE: - BENIGN COLORECTAL MUCOSA WITH INTRAMUCOSAL LYMPHOID AGGREGATES. - NEGATIVE FOR DYSPLASIA AND MALIGNANCY.   GROSS DESCRIPTION: A. Labeled: Rectal polyp x1 Received: Formalin Collection time: 9:19 AM on 11/27/2021 Placed into formalin time: 9:19 AM on 11/27/2021 Tissue fragment(s): 1 Size: 0.5 x 0.2 x 0.1 cm Description: Tan to brown soft tissue fragment Entirely submitted in 1 cassette.  [facility] 11/30/2021  Final Diagnosis performed by Betsy Pries, MD.   Electronically signed 12/01/2021 9:45:07AM The electronic signature indicates that the named Attending Pathologist has evaluated the specimen Technical component performed at [facility], [address], [place], [place] [address] Lab: [phone] Dir:  Rush Farmer, MD, MMM  Professional component performed at [facility], [facility], [place], [place],  [address]  Lab: [phone] Dir: Kathi Simpers, MD    Objective  Body mass index is 44.57 kg/m. Wt Readings from Last 3 Encounters:  12/02/21 231 lb 3.2 oz (104.9 kg)  11/27/21 227 lb 11.2 oz (103.3 kg)  11/28/20 232 lb 9.6 oz (105.5 kg)   Temp Readings from Last 3 Encounters:  12/02/21 97.9 F (36.6 C) (Temporal)  11/27/21 (!) 97.5 F (36.4 C)  11/28/20 98.2 F (36.8 C) (Oral)   BP Readings from Last 3 Encounters:  12/02/21 134/80  11/27/21 101/69  11/28/20 126/84   Pulse Readings from Last 3 Encounters:  12/02/21 64  11/27/21 89  11/28/20 76    Physical Exam Vitals and nursing note  reviewed.  Constitutional:      Appearance: Normal appearance. She is well-developed and well-groomed.  HENT:     Head: Normocephalic and atraumatic.  Eyes:     Conjunctiva/sclera: Conjunctivae normal.     Pupils: Pupils are equal, round, and reactive to light.  Cardiovascular:     Rate and Rhythm: Normal rate and regular rhythm.     Heart sounds: Normal heart sounds. No murmur heard. Pulmonary:     Effort: Pulmonary effort is normal.     Breath sounds: Normal breath sounds.  Abdominal:     General: Abdomen is flat. Bowel sounds are normal.     Tenderness: There is no abdominal tenderness.  Genitourinary:    General: Normal vulva.     Pubic Area: No rash.      Labia:        Right: No rash.        Left: No rash.      Vagina: Normal.     Cervix: Normal.     Uterus: Normal.      Adnexa: Right adnexa normal and left adnexa normal.  Musculoskeletal:        General: No tenderness.  Skin:    General: Skin is warm and dry.  Neurological:     General: No focal deficit present.     Mental Status: She is alert and oriented to person, place, and time. Mental status is at baseline.     Cranial Nerves: Cranial nerves 2-12 are intact.     Gait: Gait is intact.  Psychiatric:        Attention and Perception: Attention and perception normal.        Mood and Affect: Mood and affect normal.         Speech: Speech normal.        Behavior: Behavior normal. Behavior is cooperative.        Thought Content: Thought content normal.        Cognition and Memory: Cognition and memory normal.        Judgment: Judgment normal.    Assessment  Plan  Annual physical exam See below  Anxiety - Plan: ALPRAZolam (XANAX) 0.25 MG tablet  Hypothyroidism, unspecified type - Plan: levothyroxine (SYNTHROID) 175 MCG tablet  Allergic rhinitis, unspecified seasonality, unspecified trigger - Plan: ipratropium (ATROVENT) 0.06 % nasal spray, albuterol (PROAIR HFA) 108 (90 Base) MCG/ACT inhaler, budesonide-formoterol (SYMBICORT) 80-4.5 MCG/ACT inhaler  Hypertension,sl elevated today- Plan: Comprehensive metabolic panel, Lipid panel, CBC with Differential/Platelet Norvasc 5 mg qd losartan 100 mg qd   Prediabetes - Plan: Hemoglobin A1c  Vitamin D deficiency - Plan: Vitamin D (25 hydroxy)   Atypical squamous cell changes of undetermined significance (ASCUS) on cervical cytology with negative high risk human papilloma virus (HPV) test result - Plan: Cytology - PAP( [place])  Routine cervical smear - Plan: Cytology - PAP( [place])  Mild intermittent asthma, controlled  - Plan: albuterol (PROAIR HFA) 108 (90 Base) MCG/ACT inhaler, budesonide-formoterol (SYMBICORT) 80-4.5 MCG/ACT inhaler  HM Had flu utd Tdap had 01/11/20  pna 23 had 11/25/17  covid 3/3 rec 4th  Rec prevnar  Hep B immune consider hep A vaccine h/o fatty liver shingrix 2/2 doses  MMR immune Hep C negative    mammo neg 10/22/20 negative ordered    Pap neg 11/25/17 neg HPV pap today normal and breast exam  ASCUS pap 11/28/20 pap today   colonoscopy had 06/07/14 Dr. Allen Norris polpys hyperplastic and tubular colonoscopy 11/27/21 neg bx f/u  in 7 years    rec healthy diet choices and exercise currently doing 20-25 min on treadmill    CT chest recommended to f/u CT ab/pelvis 05/2018 noted lung nodules  -as of 11/23/19 pt to call back  with facility she wants to go to [facility] costs to much  12/12/2019 IMPRESSION: Scattered tiny pulmonary nodules bilaterally. Some of these are calcified and likely related to postinflammatory change. The visualized nodules in the lower lobes are stable from the prior CT from 2019 consistent with a prior infectious etiology. No sizable nodules are seen. Given the low risk history, no further follow-up is recommended.   Cholelithiasis without complicating factors.   Aortic Atherosclerosis (ICD10-I70.0).   -consider dermatology in future let for know for referral   GI 121/6/22 benign polyp f/u in 7 years     rec healthy diet and exercise     Provider: Dr. Olivia Mackie McLean-Scocuzza-Internal Medicine

## 2021-12-02 NOTE — Patient Instructions (Signed)
Prevnar 13 and covid shot consider  Pneumococcal Conjugate Vaccine (Prevnar 13) Suspension for Injection What is this medication? PNEUMOCOCCAL VACCINE (NEU mo KOK al vak SEEN) is a vaccine used to prevent pneumococcus bacterial infections. These bacteria can cause serious infections like pneumonia, meningitis, and blood infections. This vaccine will lower your chance of getting pneumonia. If you do get pneumonia, it can make your symptoms milder and your illness shorter. This vaccine will not treat an infection and will not cause infection. This vaccine is recommended for infants and young children, adults with certain medical conditions, and adults 46 years or older. This medicine may be used for other purposes; ask your health care provider or pharmacist if you have questions. COMMON BRAND NAME(S): Prevnar, Prevnar 13 What should I tell my care team before I take this medication? They need to know if you have any of these conditions: bleeding problems fever immune system problems an unusual or allergic reaction to pneumococcal vaccine, diphtheria toxoid, other vaccines, latex, other medicines, foods, dyes, or preservatives pregnant or trying to get pregnant breast-feeding How should I use this medication? This vaccine is for injection into a muscle. It is given by a health care professional. A copy of Vaccine Information Statements will be given before each vaccination. Read this sheet carefully each time. The sheet may change frequently. Talk to your pediatrician regarding the use of this medicine in children. While this drug may be prescribed for children as young as 42 weeks old for selected conditions, precautions do apply. Overdosage: If you think you have taken too much of this medicine contact a poison control center or emergency room at once. NOTE: This medicine is only for you. Do not share this medicine with others. What if I miss a dose? It is important not to miss your dose. Call  your doctor or health care professional if you are unable to keep an appointment. What may interact with this medication? medicines for cancer chemotherapy medicines that suppress your immune function steroid medicines like prednisone or cortisone This list may not describe all possible interactions. Give your health care provider a list of all the medicines, herbs, non-prescription drugs, or dietary supplements you use. Also tell them if you smoke, drink alcohol, or use illegal drugs. Some items may interact with your medicine. What should I watch for while using this medication? Mild fever and pain should go away in 3 days or less. Report any unusual symptoms to your doctor or health care professional. What side effects may I notice from receiving this medication? Side effects that you should report to your doctor or health care professional as soon as possible: allergic reactions like skin rash, itching or hives, swelling of the face, lips, or tongue breathing problems confused fast or irregular heartbeat fever over 102 degrees F seizures unusual bleeding or bruising unusual muscle weakness Side effects that usually do not require medical attention (report to your doctor or health care professional if they continue or are bothersome): aches and pains diarrhea fever of 102 degrees F or less headache irritable loss of appetite pain, tender at site where injected trouble sleeping This list may not describe all possible side effects. Call your doctor for medical advice about side effects. You may report side effects to FDA at 1-800-FDA-1088. Where should I keep my medication? This does not apply. This vaccine is given in a clinic, pharmacy, doctor's office, or other health care setting and will not be stored at home. NOTE: This sheet is a  summary. It may not cover all possible information. If you have questions about this medicine, talk to your doctor, pharmacist, or health care  provider.  2022 Elsevier/Gold Standard (2014-09-05 00:00:00)

## 2021-12-08 LAB — CYTOLOGY - PAP
Comment: NEGATIVE
Diagnosis: UNDETERMINED — AB
High risk HPV: NEGATIVE

## 2021-12-15 ENCOUNTER — Other Ambulatory Visit: Payer: Self-pay

## 2021-12-16 DIAGNOSIS — R87610 Atypical squamous cells of undetermined significance on cytologic smear of cervix (ASC-US): Secondary | ICD-10-CM | POA: Insufficient documentation

## 2021-12-16 NOTE — Addendum Note (Signed)
Addended by: Orland Mustard on: 12/16/2021 04:33 PM   Modules accepted: Orders

## 2021-12-18 ENCOUNTER — Other Ambulatory Visit: Payer: Self-pay

## 2021-12-18 ENCOUNTER — Ambulatory Visit: Payer: No Typology Code available for payment source | Attending: Internal Medicine

## 2021-12-18 ENCOUNTER — Telehealth: Payer: Self-pay | Admitting: Internal Medicine

## 2021-12-18 DIAGNOSIS — Z23 Encounter for immunization: Secondary | ICD-10-CM

## 2021-12-18 MED ORDER — PFIZER COVID-19 VAC BIVALENT 30 MCG/0.3ML IM SUSP
INTRAMUSCULAR | 0 refills | Status: DC
  Filled 2021-12-18: qty 0.3, 30d supply, fill #0

## 2021-12-18 NOTE — Progress Notes (Signed)
° °  Covid-19 Vaccination Clinic  Name:  Barbara Blake    MRN: [id number] DOB: 1956/04/20  12/18/2021  [name] was observed post Covid-19 immunization for 15 minutes without incident. She was provided with Vaccine Information Sheet and instruction to access the V-Safe system.   [name] was instructed to call 911 with any severe reactions post vaccine: Difficulty breathing  Swelling of face and throat  A fast heartbeat  A bad rash all over body  Dizziness and weakness   Immunizations Administered     Name Date Dose VIS Date Route   Pfizer Covid-19 Vaccine Bivalent Booster 12/18/2021  2:15 PM 0.3 mL 08/12/2021 Intramuscular   Manufacturer: [place]   Lot: [id number]   [place]: [place], PharmD, MBA Clinical Acute Care Pharmacist

## 2021-12-18 NOTE — Telephone Encounter (Signed)
Rejection Reason - Patient did not respond" Arloa Koh said on Dec 17, 2021 1:04 PM  "lm 12/03/21 " Katelyn Deel said on Dec 10, 2021 2:05 PM  Msg from [place] [facility]

## 2021-12-24 ENCOUNTER — Other Ambulatory Visit: Payer: Self-pay | Admitting: Internal Medicine

## 2021-12-24 ENCOUNTER — Other Ambulatory Visit: Payer: Self-pay

## 2021-12-24 DIAGNOSIS — I10 Essential (primary) hypertension: Secondary | ICD-10-CM

## 2021-12-24 MED ORDER — AMLODIPINE BESYLATE 5 MG PO TABS
ORAL_TABLET | ORAL | 3 refills | Status: DC
  Filled 2021-12-24: qty 90, 90d supply, fill #0
  Filled 2022-04-05: qty 90, 90d supply, fill #1
  Filled 2022-07-12: qty 90, 90d supply, fill #2
  Filled 2022-11-02: qty 90, 90d supply, fill #3

## 2022-02-01 ENCOUNTER — Other Ambulatory Visit: Payer: Self-pay

## 2022-02-05 DIAGNOSIS — M1990 Unspecified osteoarthritis, unspecified site: Secondary | ICD-10-CM | POA: Insufficient documentation

## 2022-02-15 ENCOUNTER — Other Ambulatory Visit: Payer: Self-pay

## 2022-02-26 LAB — HM DIABETES EYE EXAM

## 2022-03-04 ENCOUNTER — Other Ambulatory Visit: Payer: Self-pay

## 2022-04-05 ENCOUNTER — Other Ambulatory Visit: Payer: Self-pay

## 2022-04-08 ENCOUNTER — Encounter: Payer: Self-pay | Admitting: Internal Medicine

## 2022-05-03 ENCOUNTER — Encounter: Payer: Self-pay | Admitting: Ophthalmology

## 2022-05-11 NOTE — Discharge Instructions (Signed)

## 2022-05-13 ENCOUNTER — Other Ambulatory Visit: Payer: Self-pay

## 2022-05-13 ENCOUNTER — Ambulatory Visit: Payer: No Typology Code available for payment source | Admitting: Anesthesiology

## 2022-05-13 ENCOUNTER — Encounter: Payer: Self-pay | Admitting: Ophthalmology

## 2022-05-13 ENCOUNTER — Ambulatory Visit
Admission: RE | Admit: 2022-05-13 | Discharge: 2022-05-13 | Disposition: A | Discharge status: Home / Self Care | Payer: No Typology Code available for payment source | Attending: Ophthalmology | Admitting: Ophthalmology

## 2022-05-13 ENCOUNTER — Encounter
Admission: RE | Disposition: A | Discharge status: Home / Self Care | Payer: Self-pay | Source: Home / Self Care | Attending: Ophthalmology

## 2022-05-13 DIAGNOSIS — K219 Gastro-esophageal reflux disease without esophagitis: Secondary | ICD-10-CM | POA: Insufficient documentation

## 2022-05-13 DIAGNOSIS — E039 Hypothyroidism, unspecified: Secondary | ICD-10-CM | POA: Insufficient documentation

## 2022-05-13 DIAGNOSIS — E669 Obesity, unspecified: Secondary | ICD-10-CM | POA: Insufficient documentation

## 2022-05-13 DIAGNOSIS — Z6841 Body Mass Index (BMI) 40.0 and over, adult: Secondary | ICD-10-CM | POA: Diagnosis not present

## 2022-05-13 DIAGNOSIS — H2511 Age-related nuclear cataract, right eye: Secondary | ICD-10-CM | POA: Insufficient documentation

## 2022-05-13 DIAGNOSIS — F419 Anxiety disorder, unspecified: Secondary | ICD-10-CM | POA: Diagnosis not present

## 2022-05-13 DIAGNOSIS — R7303 Prediabetes: Secondary | ICD-10-CM

## 2022-05-13 HISTORY — PX: CATARACT EXTRACTION W/PHACO: SHX586

## 2022-05-13 SURGERY — PHACOEMULSIFICATION, CATARACT, WITH IOL INSERTION
Anesthesia: Monitor Anesthesia Care | Site: Eye | Laterality: Right

## 2022-05-13 MED ORDER — ONDANSETRON HCL 4 MG/2ML IJ SOLN
4.0000 mg | Freq: Once | INTRAMUSCULAR | Status: AC | PRN
  Administered 2022-05-13: 4 mg via INTRAVENOUS

## 2022-05-13 MED ORDER — BRIMONIDINE TARTRATE-TIMOLOL 0.2-0.5 % OP SOLN
OPHTHALMIC | Status: DC | PRN
  Administered 2022-05-13: 1 [drp] via OPHTHALMIC

## 2022-05-13 MED ORDER — TETRACAINE HCL 0.5 % OP SOLN
1.0000 [drp] | OPHTHALMIC | Status: DC | PRN
  Administered 2022-05-13 (×3): 1 [drp] via OPHTHALMIC

## 2022-05-13 MED ORDER — FENTANYL CITRATE (PF) 100 MCG/2ML IJ SOLN
INTRAMUSCULAR | Status: DC | PRN
  Administered 2022-05-13 (×2): 50 ug via INTRAVENOUS

## 2022-05-13 MED ORDER — LACTATED RINGERS IV SOLN: INTRAVENOUS | Status: DC

## 2022-05-13 MED ORDER — ACETAMINOPHEN 325 MG PO TABS: 650.0000 mg | ORAL_TABLET | ORAL | Status: DC | PRN

## 2022-05-13 MED ORDER — MOXIFLOXACIN HCL 0.5 % OP SOLN
OPHTHALMIC | Status: DC | PRN
  Administered 2022-05-13: 0.2 mL via OPHTHALMIC

## 2022-05-13 MED ORDER — SIGHTPATH DOSE#1 BSS IO SOLN
INTRAOCULAR | Status: DC | PRN
  Administered 2022-05-13: 120 mL via OPHTHALMIC

## 2022-05-13 MED ORDER — SIGHTPATH DOSE#1 NA HYALUR & NA CHOND-NA HYALUR IO KIT
PACK | INTRAOCULAR | Status: DC | PRN
  Administered 2022-05-13: 1 via OPHTHALMIC

## 2022-05-13 MED ORDER — ARMC OPHTHALMIC DILATING DROPS
1.0000 "application " | OPHTHALMIC | Status: DC | PRN
  Administered 2022-05-13 (×3): 1 via OPHTHALMIC

## 2022-05-13 MED ORDER — SIGHTPATH DOSE#1 BSS IO SOLN
INTRAOCULAR | Status: DC | PRN
  Administered 2022-05-13: 15 mL

## 2022-05-13 MED ORDER — ACETAMINOPHEN 160 MG/5ML PO SOLN: 325.0000 mg | ORAL | Status: DC | PRN

## 2022-05-13 MED ORDER — LIDOCAINE HCL (PF) 2 % IJ SOLN
INTRAOCULAR | Status: DC | PRN
  Administered 2022-05-13: 1 mL via INTRAOCULAR

## 2022-05-13 MED ORDER — MIDAZOLAM HCL 2 MG/2ML IJ SOLN
INTRAMUSCULAR | Status: DC | PRN
  Administered 2022-05-13 (×2): 1 mg via INTRAVENOUS

## 2022-05-13 SURGICAL SUPPLY — 23 items
Bausch and Lomb Silicone Coated I/A tip Handpiece ×1 IMPLANT
CANNULA ANT/CHMB 27G (MISCELLANEOUS) IMPLANT
CANNULA ANT/CHMB 27GA (MISCELLANEOUS) IMPLANT
CATARACT SUITE SIGHTPATH (MISCELLANEOUS) ×2 IMPLANT
DISSECTOR HYDRO NUCLEUS 50X22 (MISCELLANEOUS) ×2 IMPLANT
DRSG TEGADERM 2-3/8X2-3/4 SM (GAUZE/BANDAGES/DRESSINGS) ×2 IMPLANT
FEE CATARACT SUITE SIGHTPATH (MISCELLANEOUS) ×1 IMPLANT
GLOVE SURG GAMMEX PI TX LF 7.5 (GLOVE) ×2 IMPLANT
GLOVE SURG SYN 8.5  E (GLOVE) ×1
GLOVE SURG SYN 8.5 E (GLOVE) ×1 IMPLANT
GLOVE SURG SYN 8.5 PF PI (GLOVE) ×1 IMPLANT
LENS IOL RAYNER 19.5 (Intraocular Lens) ×2 IMPLANT
LENS IOL RAYONE EMV 19.5 (Intraocular Lens) IMPLANT
NDL FILTER BLUNT 18X1 1/2 (NEEDLE) ×1 IMPLANT
NEEDLE FILTER BLUNT 18X 1/2SAF (NEEDLE) ×1
NEEDLE FILTER BLUNT 18X1 1/2 (NEEDLE) ×1 IMPLANT
PACK VIT ANT 23G (MISCELLANEOUS) IMPLANT
RING MALYGIN (MISCELLANEOUS) IMPLANT
SUT ETHILON 10-0 CS-B-6CS-B-6 (SUTURE)
SUTURE EHLN 10-0 CS-B-6CS-B-6 (SUTURE) IMPLANT
SYR 3ML LL SCALE MARK (SYRINGE) ×2 IMPLANT
SYR 5ML LL (SYRINGE) ×2 IMPLANT
WATER STERILE IRR 250ML POUR (IV SOLUTION) ×2 IMPLANT

## 2022-05-13 NOTE — Transfer of Care (Signed)
Immediate Anesthesia Transfer of Care Note  Patient: Barbara Blake  Procedure(s) Performed: CATARACT EXTRACTION PHACO AND INTRAOCULAR LENS PLACEMENT (IOC) RIGHT RAYNER LENS (Right: Eye)  Patient Location: PACU  Anesthesia Type: MAC  Level of Consciousness: awake, alert  and patient cooperative  Airway and Oxygen Therapy: Patient Spontanous Breathing and Patient connected to supplemental oxygen  Post-op Assessment: Post-op Vital signs reviewed, Patient's Cardiovascular Status Stable, Respiratory Function Stable, Patent Airway and No signs of Nausea or vomiting  Post-op Vital Signs: Reviewed and stable  Complications: No notable events documented.

## 2022-05-13 NOTE — Anesthesia Preprocedure Evaluation (Signed)
Anesthesia Evaluation  Patient identified by MRN, date of birth, ID band Patient awake    Reviewed: Allergy & Precautions, NPO status , Patient's Chart, lab work & pertinent test results, reviewed documented beta blocker date and time   History of Anesthesia Complications Negative for: history of anesthetic complications  Airway Mallampati: II  TM Distance: >3 FB Neck ROM: Full    Dental   Pulmonary asthma ,    breath sounds clear to auscultation       Cardiovascular hypertension, (-) angina(-) DOE  Rhythm:Regular Rate:Normal   HLD   Neuro/Psych Anxiety  Neuromuscular disease (Sciatica)    GI/Hepatic GERD  Controlled,  Endo/Other  diabetes (Pre-DM)Hypothyroidism   Renal/GU Renal disease (Stones)     Musculoskeletal   Abdominal (+) + obese (BMI 43),   Peds  Hematology   Anesthesia Other Findings   Reproductive/Obstetrics                             Anesthesia Physical Anesthesia Plan  ASA: 3  Anesthesia Plan: MAC   Post-op Pain Management:    Induction: Intravenous  PONV Risk Score and Plan: 2 and TIVA, Midazolam and Treatment may vary due to age or medical condition  Airway Management Planned: Nasal Cannula  Additional Equipment:   Intra-op Plan:   Post-operative Plan:   Informed Consent: I have reviewed the patients History and Physical, chart, labs and discussed the procedure including the risks, benefits and alternatives for the proposed anesthesia with the patient or authorized representative who has indicated his/her understanding and acceptance.       Plan Discussed with: CRNA and Anesthesiologist  Anesthesia Plan Comments:         Anesthesia Quick Evaluation

## 2022-05-13 NOTE — Op Note (Signed)
OPERATIVE NOTE  Barbara Blake [id number] 05/13/2022   PREOPERATIVE DIAGNOSIS: Nuclear sclerotic cataract right eye. H25.11   POSTOPERATIVE DIAGNOSIS: Nuclear sclerotic cataract right eye. H25.11   PROCEDURE:  Phacoemusification with posterior chamber intraocular lens placement of the right eye  Ultrasound time: Procedure(s) with comments: CATARACT EXTRACTION PHACO AND INTRAOCULAR LENS PLACEMENT (IOC) RIGHT RAYNER LENS (Right) - 12.84 1:47.2  LENS:   Implant Name Type Inv. Item Serial No. Manufacturer Lot No. LRB No. Used Action  RAYNER RAYONE EMV [place] IOL Intraocular Lens   [employer] [id number] Right 1 Implanted      SURGEON:  Courtney Heys. Lazarus Salines, MD   ANESTHESIA:  Topical with tetracaine drops, augmented with 1% preservative-free intracameral lidocaine.   COMPLICATIONS:  None.   DESCRIPTION OF PROCEDURE:  The patient was identified in the holding room and transported to the operating room and placed in the supine position under the operating microscope.  The right eye was identified as the operative eye, which was prepped and draped in the usual sterile ophthalmic fashion.   A 1 millimeter clear-corneal paracentesis was made superotemporally. Preservative-free 1% lidocaine mixed with 1:1,000 bisulfite-free aqueous solution of epinephrine was injected into the anterior chamber. The anterior chamber was then filled with Viscoat viscoelastic. A 2.4 millimeter keratome was used to make a clear-corneal incision inferotemporally. A curvilinear capsulorrhexis was made with a cystotome and capsulorrhexis forceps. Balanced salt solution was used to hydrodissect and hydrodelineate the nucleus. Phacoemulsification was then used to remove the lens nucleus and epinucleus. The remaining cortex was then removed using the irrigation and aspiration handpiece. Provisc was then placed into the capsular bag to distend it for lens placement. A +19.50 D [id number] intraocular lens was then injected  into the capsular bag. The remaining viscoelastic was aspirated.   Wounds were hydrated with balanced salt solution.  The anterior chamber was inflated to a physiologic pressure with balanced salt solution.  No wound leaks were noted. Vigamox was injected intracamerally.  Timolol and Brimonidine drops were applied to the eye.  The patient was taken to the recovery room in stable condition without complications of anesthesia or surgery.  [name] 05/13/2022, 12:56 PM

## 2022-05-13 NOTE — Anesthesia Postprocedure Evaluation (Signed)
Anesthesia Post Note  Patient: [name]  Procedure(s) Performed: CATARACT EXTRACTION PHACO AND INTRAOCULAR LENS PLACEMENT (IOC) RIGHT RAYNER LENS (Right: Eye)     Patient location during evaluation: PACU Anesthesia Type: MAC Level of consciousness: awake and alert Pain management: pain level controlled Vital Signs Assessment: post-procedure vital signs reviewed and stable Respiratory status: spontaneous breathing, nonlabored ventilation, respiratory function stable and patient connected to nasal cannula oxygen Cardiovascular status: stable and blood pressure returned to baseline Postop Assessment: no apparent nausea or vomiting Anesthetic complications: no   No notable events documented.  Roxine Whittinghill A  [name]

## 2022-05-13 NOTE — H&P (Signed)
[facility]   Primary Care Physician:  McLean-Scocuzza, Nino Glow, MD Ophthalmologist: Dr. Merleen Nicely  Pre-Procedure History & Physical: HPI:  Barbara Blake is a 66 y.o. female here for cataract surgery.   Past Medical History:  Diagnosis Date   Anxiety    Asthma    Gallstones    History of kidney stones    HTN (hypertension)    Hypothyroidism    Low back pain    Lung nodule    Obesity    Vitamin D deficiency     Past Surgical History:  Procedure Laterality Date   APPENDECTOMY     COLONOSCOPY N/A 11/27/2021   Procedure: COLONOSCOPY WITH BIOPSY;  Surgeon: Lucilla Lame, MD;  Location: [place];  Service: Endoscopy;  Laterality: N/A;   DILATION AND CURETTAGE OF UTERUS     EXTRACORPOREAL SHOCK WAVE LITHOTRIPSY Left 06/22/2018   Procedure: EXTRACORPOREAL SHOCK WAVE LITHOTRIPSY (ESWL);  Surgeon: Abbie Sons, MD;  Location: [facility];  Service: Urology;  Laterality: Left;   LIPOMA EXCISION     MASS EXCISION Right 07/16/2020   Procedure: EXCISION OF A SUBCUTANEOUS SOFT TISSUE MASS IN THE ANTERIOR ASPECT OF RIGHT KNEE.;  Surgeon: Corky Mull, MD;  Location: [facility];  Service: Orthopedics;  Laterality: Right;   POLYPECTOMY N/A 11/27/2021   Procedure: POLYPECTOMY;  Surgeon: Lucilla Lame, MD;  Location: [place];  Service: Endoscopy;  Laterality: N/A;    Prior to Admission medications   Medication Sig Start Date End Date Taking? Authorizing Provider  albuterol (PROAIR HFA) 108 (90 Base) MCG/ACT inhaler Inhale 1-2 puffs into the lungs every 6 (six) hours as needed for wheezing or shortness of breath. 12/02/21  Yes McLean-Scocuzza, Nino Glow, MD  ALPRAZolam (XANAX) 0.25 MG tablet TAKE 1/2 TABLET BY MOUTH DAILY AS NEEDED FOR ANXIETY. 12/02/21 05/31/22 Yes McLean-Scocuzza, Nino Glow, MD  amLODipine (NORVASC) 5 MG tablet TAKE 1 TABLET (5 MG TOTAL) BY MOUTH EVERY MORNING. 12/24/21 12/24/22 Yes McLean-Scocuzza, Nino Glow, MD  cholecalciferol (VITAMIN D3) 25 MCG  (1000 UNIT) tablet Take 1,000 Units by mouth daily.   Yes [provider]  COVID-19 mRNA bivalent vaccine, Pfizer, (PFIZER COVID-19 VAC BIVALENT) injection Inject into the muscle. 12/18/21  Yes Carlyle Basques, MD  Glucosamine HCl (GLUCOSAMINE PO) Take by mouth.   Yes [provider]  hydrochlorothiazide (HYDRODIURIL) 25 MG tablet Take by mouth daily. 06/11/21 06/11/22 Yes McLean-Scocuzza, Nino Glow, MD  ibuprofen (ADVIL) 200 MG tablet Take 200 mg by mouth every 8 (eight) hours as needed for moderate pain.   Yes [provider]  ipratropium (ATROVENT) 0.06 % nasal spray Place 2 sprays into both nostrils 3 (three) times daily as needed for rhinitis. 12/02/21  Yes McLean-Scocuzza, Nino Glow, MD  levothyroxine (SYNTHROID) 175 MCG tablet TAKE 1 TABLET BY MOUTH DAILY BEFORE BREAKFAST. 12/02/21 12/02/22 Yes McLean-Scocuzza, Nino Glow, MD  losartan (COZAAR) 100 MG tablet TAKE 1 TABLET (100 MG TOTAL) BY MOUTH DAILY. 12/02/21 12/02/22 Yes McLean-Scocuzza, Nino Glow, MD  naproxen sodium (ALEVE) 220 MG tablet Take 440 mg by mouth daily as needed (pain).   Yes [provider]  budesonide-formoterol (SYMBICORT) 80-4.5 MCG/ACT inhaler Inhale 2 puffs into the lungs 2 (two) times daily. Rinse mouth Patient not taking: Reported on 05/03/2022 12/02/21   McLean-Scocuzza, Nino Glow, MD    Allergies as of 04/06/2022   (No Known Allergies)    Family History  Problem Relation Age of Onset   Alzheimer's disease Mother    Hypertension  Mother    Cancer Mother 17       colon   Lupus Daughter    Heart attack Maternal Grandmother    Breast cancer Other    Breast cancer Maternal Aunt 68   Bladder Cancer Neg Hx    Kidney cancer Neg Hx     Social History   Socioeconomic History   Marital status: Married    Spouse name: Not on file   Number of children: 2   Years of education: Not on file   Highest education level: Not on file  Occupational History   Occupation: Investment banker, 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    From [employer] [facility]   As of 11/2019 married 30 years    2 kids       Regular Exercise -  Not at this time   Daily Caffeine Use:  1 cup hot tea daily         Social Determinants of Health   Financial Resource Strain: Not on file  Food Insecurity: Not on file  Transportation Needs: Not on file  Physical Activity: Not on file  Stress: Not on file  Social Connections: Not on file  Intimate Partner Violence: Not on file    Review of Systems: See HPI, otherwise negative ROS  Physical Exam: BP 140/76   Pulse 68   Temp 97.7 F (36.5 C) (Temporal)   Resp 20   Ht '5\' 1"'$  (1.549 m)   Wt 103.4 kg   SpO2 100%   BMI 43.08 kg/m  General:   Alert, cooperative in NAD Head:  Normocephalic and atraumatic. Respiratory:  Normal work of breathing. Cardiovascular:  RRR  Impression/Plan: Barbara Blake is here for cataract surgery.  Risks, benefits, limitations, and alternatives regarding cataract surgery have been reviewed with the patient.  Questions have been answered.  All parties agreeable.   Norvel Richards, MD  05/13/2022, 11:27 AM

## 2022-05-14 ENCOUNTER — Encounter: Payer: Self-pay | Admitting: Ophthalmology

## 2022-05-19 ENCOUNTER — Encounter: Payer: Self-pay | Admitting: Ophthalmology

## 2022-05-25 NOTE — Discharge Instructions (Signed)

## 2022-05-27 ENCOUNTER — Ambulatory Visit: Payer: No Typology Code available for payment source | Admitting: Anesthesiology

## 2022-05-27 ENCOUNTER — Ambulatory Visit
Admission: RE | Admit: 2022-05-27 | Discharge: 2022-05-27 | Disposition: A | Discharge status: Home / Self Care | Payer: No Typology Code available for payment source | Attending: Ophthalmology | Admitting: Ophthalmology

## 2022-05-27 ENCOUNTER — Encounter
Admission: RE | Disposition: A | Discharge status: Home / Self Care | Payer: Self-pay | Source: Home / Self Care | Attending: Ophthalmology

## 2022-05-27 ENCOUNTER — Other Ambulatory Visit: Payer: Self-pay

## 2022-05-27 ENCOUNTER — Encounter: Payer: Self-pay | Admitting: Ophthalmology

## 2022-05-27 DIAGNOSIS — Z6841 Body Mass Index (BMI) 40.0 and over, adult: Secondary | ICD-10-CM | POA: Insufficient documentation

## 2022-05-27 DIAGNOSIS — I10 Essential (primary) hypertension: Secondary | ICD-10-CM | POA: Diagnosis not present

## 2022-05-27 DIAGNOSIS — F419 Anxiety disorder, unspecified: Secondary | ICD-10-CM | POA: Insufficient documentation

## 2022-05-27 DIAGNOSIS — H2512 Age-related nuclear cataract, left eye: Secondary | ICD-10-CM | POA: Insufficient documentation

## 2022-05-27 DIAGNOSIS — J45909 Unspecified asthma, uncomplicated: Secondary | ICD-10-CM | POA: Diagnosis not present

## 2022-05-27 DIAGNOSIS — E039 Hypothyroidism, unspecified: Secondary | ICD-10-CM | POA: Diagnosis not present

## 2022-05-27 HISTORY — PX: CATARACT EXTRACTION W/PHACO: SHX586

## 2022-05-27 SURGERY — PHACOEMULSIFICATION, CATARACT, WITH IOL INSERTION
Anesthesia: Monitor Anesthesia Care | Site: Eye | Laterality: Left

## 2022-05-27 MED ORDER — SIGHTPATH DOSE#1 NA HYALUR & NA CHOND-NA HYALUR IO KIT
PACK | INTRAOCULAR | Status: DC | PRN
  Administered 2022-05-27: 1 via OPHTHALMIC

## 2022-05-27 MED ORDER — SIGHTPATH DOSE#1 BSS IO SOLN
INTRAOCULAR | Status: DC | PRN
  Administered 2022-05-27: 94 mL via OPHTHALMIC

## 2022-05-27 MED ORDER — BRIMONIDINE TARTRATE-TIMOLOL 0.2-0.5 % OP SOLN
OPHTHALMIC | Status: DC | PRN
  Administered 2022-05-27: 1 [drp] via OPHTHALMIC

## 2022-05-27 MED ORDER — ACETAMINOPHEN 160 MG/5ML PO SOLN: 325.0000 mg | Freq: Once | ORAL | Status: DC

## 2022-05-27 MED ORDER — ARMC OPHTHALMIC DILATING DROPS
1.0000 "application " | OPHTHALMIC | Status: DC | PRN
  Administered 2022-05-27 (×3): 1 via OPHTHALMIC

## 2022-05-27 MED ORDER — FENTANYL CITRATE (PF) 100 MCG/2ML IJ SOLN
INTRAMUSCULAR | Status: DC | PRN
  Administered 2022-05-27 (×2): 50 ug via INTRAVENOUS

## 2022-05-27 MED ORDER — SIGHTPATH DOSE#1 BSS IO SOLN
INTRAOCULAR | Status: DC | PRN
  Administered 2022-05-27: 15 mL

## 2022-05-27 MED ORDER — ACETAMINOPHEN 325 MG PO TABS: 325.0000 mg | ORAL_TABLET | Freq: Once | ORAL | Status: DC

## 2022-05-27 MED ORDER — LIDOCAINE HCL (PF) 2 % IJ SOLN
INTRAOCULAR | Status: DC | PRN
  Administered 2022-05-27: 1 mL via INTRAOCULAR

## 2022-05-27 MED ORDER — TETRACAINE HCL 0.5 % OP SOLN
1.0000 [drp] | OPHTHALMIC | Status: DC | PRN
  Administered 2022-05-27 (×3): 1 [drp] via OPHTHALMIC

## 2022-05-27 MED ORDER — MIDAZOLAM HCL 2 MG/2ML IJ SOLN
INTRAMUSCULAR | Status: DC | PRN
  Administered 2022-05-27: 2 mg via INTRAVENOUS

## 2022-05-27 MED ORDER — ONDANSETRON HCL 4 MG/2ML IJ SOLN
INTRAMUSCULAR | Status: DC | PRN
  Administered 2022-05-27: 4 mg via INTRAVENOUS

## 2022-05-27 MED ORDER — LACTATED RINGERS IV SOLN: INTRAVENOUS | Status: DC

## 2022-05-27 MED ORDER — MOXIFLOXACIN HCL 0.5 % OP SOLN
OPHTHALMIC | Status: DC | PRN
  Administered 2022-05-27: 0.2 mL via OPHTHALMIC

## 2022-05-27 SURGICAL SUPPLY — 16 items
CATARACT SUITE SIGHTPATH (MISCELLANEOUS) ×2 IMPLANT
DISSECTOR HYDRO NUCLEUS 50X22 (MISCELLANEOUS) ×2 IMPLANT
DRSG TEGADERM 2-3/8X2-3/4 SM (GAUZE/BANDAGES/DRESSINGS) ×2 IMPLANT
FEE CATARACT SUITE SIGHTPATH (MISCELLANEOUS) ×1 IMPLANT
GLOVE SURG GAMMEX PI TX LF 7.5 (GLOVE) ×2 IMPLANT
GLOVE SURG SYN 8.5  E (GLOVE) ×1
GLOVE SURG SYN 8.5 E (GLOVE) ×1 IMPLANT
GLOVE SURG SYN 8.5 PF PI (GLOVE) ×1 IMPLANT
LENS IOL RAYNER 20.0 (Intraocular Lens) ×2 IMPLANT
LENS IOL RAYONE EMV 20.0 (Intraocular Lens) IMPLANT
NDL FILTER BLUNT 18X1 1/2 (NEEDLE) ×1 IMPLANT
NEEDLE FILTER BLUNT 18X 1/2SAF (NEEDLE) ×1
NEEDLE FILTER BLUNT 18X1 1/2 (NEEDLE) ×1 IMPLANT
SYR 3ML LL SCALE MARK (SYRINGE) ×2 IMPLANT
SYR 5ML LL (SYRINGE) ×2 IMPLANT
WATER STERILE IRR 250ML POUR (IV SOLUTION) ×2 IMPLANT

## 2022-05-27 NOTE — Anesthesia Postprocedure Evaluation (Signed)
Anesthesia Post Note  Patient: Barbara Blake  Procedure(s) Performed: CATARACT EXTRACTION PHACO AND INTRAOCULAR LENS PLACEMENT (IOC) LEFT RAYNER LENS 10.93 01:07.6 (Left: Eye)     Patient location during evaluation: PACU Anesthesia Type: MAC Level of consciousness: awake and alert and oriented Pain management: satisfactory to patient Vital Signs Assessment: post-procedure vital signs reviewed and stable Respiratory status: spontaneous breathing, nonlabored ventilation and respiratory function stable Cardiovascular status: blood pressure returned to baseline and stable Postop Assessment: Adequate PO intake and No signs of nausea or vomiting Anesthetic complications: no   No notable events documented.  Raliegh Ip

## 2022-05-27 NOTE — Anesthesia Procedure Notes (Signed)
Procedure Name: MAC Date/Time: 05/27/2022 1:17 PM  Performed by: Jeannene Patella, CRNAPre-anesthesia Checklist: Patient identified, Emergency Drugs available, Suction available, Timeout performed and Patient being monitored Patient Re-evaluated:Patient Re-evaluated prior to induction Oxygen Delivery Method: Nasal cannula Placement Confirmation: positive ETCO2

## 2022-05-27 NOTE — Op Note (Signed)
OPERATIVE NOTE  TERRIKA ZUVER [id number] 05/27/2022   PREOPERATIVE DIAGNOSIS: Nuclear sclerotic cataract left eye. H25.12   POSTOPERATIVE DIAGNOSIS: Nuclear sclerotic cataract left eye. H25.12   PROCEDURE:  Phacoemusification with posterior chamber intraocular lens placement of the left eye  Ultrasound time: Procedure(s): CATARACT EXTRACTION PHACO AND INTRAOCULAR LENS PLACEMENT (IOC) LEFT RAYNER LENS 10.93 01:07.6 (Left)  LENS:   Implant Name Type Inv. Item Serial No. Manufacturer Lot No. LRB No. Used Action  LENS IOL RAYNER 20.0 - [id number] Intraocular Lens LENS IOL RAYNER 20.0  [employer] [id number] Left 1 Implanted      SURGEON:  Courtney Heys. Lazarus Salines, MD   ANESTHESIA:  Topical with tetracaine drops, augmented with 1% preservative-free intracameral lidocaine.   COMPLICATIONS:  None.   DESCRIPTION OF PROCEDURE:  The patient was identified in the holding room and transported to the operating room and placed in the supine position under the operating microscope.  The left eye was identified as the operative eye, which was prepped and draped in the usual sterile ophthalmic fashion.   A 1 millimeter clear-corneal paracentesis was made inferotemporally. Preservative-free 1% lidocaine mixed with 1:1,000 bisulfite-free aqueous solution of epinephrine was injected into the anterior chamber. The anterior chamber was then filled with Viscoat viscoelastic. A 2.4 millimeter keratome was used to make a clear-corneal incision superotemporally. A curvilinear capsulorrhexis was made with a cystotome and capsulorrhexis forceps. Balanced salt solution was used to hydrodissect and hydrodelineate the nucleus. Phacoemulsification was then used to remove the lens nucleus and epinucleus. The remaining cortex was then removed using the irrigation and aspiration handpiece. Provisc was then placed into the capsular bag to distend it for lens placement. A +20.00 [id number] intraocular lens was then injected into the  capsular bag. The remaining viscoelastic was aspirated.   Wounds were hydrated with balanced salt solution.  The anterior chamber was inflated to a physiologic pressure with balanced salt solution.  No wound leaks were noted. Vigamox was injected intracamerally.  Timolol and Brimonidine drops were applied to the eye.  The patient was taken to the recovery room in stable condition without complications of anesthesia or surgery.  Maryann Alar [place] 05/27/2022, 1:42 PM

## 2022-05-27 NOTE — Anesthesia Preprocedure Evaluation (Signed)
Anesthesia Evaluation  Patient identified by MRN, date of birth, ID band Patient awake    Reviewed: Allergy & Precautions, H&P , NPO status , Patient's Chart, lab work & pertinent test results, reviewed documented beta blocker date and time   History of Anesthesia Complications Negative for: history of anesthetic complications  Airway Mallampati: II  TM Distance: >3 FB Neck ROM: Full    Dental no notable dental hx.    Pulmonary asthma ,    Pulmonary exam normal breath sounds clear to auscultation       Cardiovascular hypertension, (-) angina(-) DOE Normal cardiovascular exam Rhythm:Regular Rate:Normal   HLD   Neuro/Psych Anxiety  Neuromuscular disease (Sciatica)    GI/Hepatic GERD  Controlled,  Endo/Other  diabetes (Pre-DM)Hypothyroidism Morbid obesity  Renal/GU Renal disease (Stones)     Musculoskeletal   Abdominal (+) + obese (BMI 43),   Peds  Hematology   Anesthesia Other Findings   Reproductive/Obstetrics                             Anesthesia Physical  Anesthesia Plan  ASA: 3  Anesthesia Plan: MAC   Post-op Pain Management: Minimal or no pain anticipated   Induction: Intravenous  PONV Risk Score and Plan: 2 and TIVA, Midazolam and Treatment may vary due to age or medical condition  Airway Management Planned: Nasal Cannula  Additional Equipment:   Intra-op Plan:   Post-operative Plan:   Informed Consent: I have reviewed the patients History and Physical, chart, labs and discussed the procedure including the risks, benefits and alternatives for the proposed anesthesia with the patient or authorized representative who has indicated his/her understanding and acceptance.     Dental Advisory Given  Plan Discussed with: CRNA and Anesthesiologist  Anesthesia Plan Comments:         Anesthesia Quick Evaluation

## 2022-05-27 NOTE — H&P (Signed)
[facility]   Primary Care Physician:  McLean-Scocuzza, Nino Glow, MD Ophthalmologist: Dr. Merleen Nicely  Pre-Procedure History & Physical: HPI:  Barbara Blake is a 66 y.o. female here for cataract surgery.   Past Medical History:  Diagnosis Date   Anxiety    Asthma    Gallstones    History of kidney stones    HTN (hypertension)    Hypothyroidism    Low back pain    Lung nodule    Obesity    Vitamin D deficiency     Past Surgical History:  Procedure Laterality Date   APPENDECTOMY     CATARACT EXTRACTION W/PHACO Right 05/13/2022   Procedure: CATARACT EXTRACTION PHACO AND INTRAOCULAR LENS PLACEMENT ([place]) RIGHT RAYNER LENS;  Surgeon: Norvel Richards, MD;  Location: [place];  Service: Ophthalmology;  Laterality: Right;  12.84 1:47.2   COLONOSCOPY N/A 11/27/2021   Procedure: COLONOSCOPY WITH BIOPSY;  Surgeon: Lucilla Lame, MD;  Location: [place];  Service: Endoscopy;  Laterality: N/A;   DILATION AND CURETTAGE OF UTERUS     EXTRACORPOREAL SHOCK WAVE LITHOTRIPSY Left 06/22/2018   Procedure: EXTRACORPOREAL SHOCK WAVE LITHOTRIPSY (ESWL);  Surgeon: Abbie Sons, MD;  Location: [facility];  Service: Urology;  Laterality: Left;   LIPOMA EXCISION     MASS EXCISION Right 07/16/2020   Procedure: EXCISION OF A SUBCUTANEOUS SOFT TISSUE MASS IN THE ANTERIOR ASPECT OF RIGHT KNEE.;  Surgeon: Corky Mull, MD;  Location: [facility];  Service: Orthopedics;  Laterality: Right;   POLYPECTOMY N/A 11/27/2021   Procedure: POLYPECTOMY;  Surgeon: Lucilla Lame, MD;  Location: [place];  Service: Endoscopy;  Laterality: N/A;    Prior to Admission medications   Medication Sig Start Date End Date Taking? Authorizing Provider  albuterol (PROAIR HFA) 108 (90 Base) MCG/ACT inhaler Inhale 1-2 puffs into the lungs every 6 (six) hours as needed for wheezing or shortness of breath. 12/02/21  Yes McLean-Scocuzza, Nino Glow, MD  ALPRAZolam (XANAX) 0.25 MG tablet TAKE 1/2  TABLET BY MOUTH DAILY AS NEEDED FOR ANXIETY. 12/02/21 05/31/22 Yes McLean-Scocuzza, Nino Glow, MD  amLODipine (NORVASC) 5 MG tablet TAKE 1 TABLET (5 MG TOTAL) BY MOUTH EVERY MORNING. 12/24/21 12/24/22 Yes McLean-Scocuzza, Nino Glow, MD  cholecalciferol (VITAMIN D3) 25 MCG (1000 UNIT) tablet Take 1,000 Units by mouth daily.   Yes [provider]  Glucosamine HCl (GLUCOSAMINE PO) Take by mouth.   Yes [provider]  hydrochlorothiazide (HYDRODIURIL) 25 MG tablet Take by mouth daily. 06/11/21 06/11/22 Yes McLean-Scocuzza, Nino Glow, MD  ibuprofen (ADVIL) 200 MG tablet Take 200 mg by mouth every 8 (eight) hours as needed for moderate pain.   Yes [provider]  levothyroxine (SYNTHROID) 175 MCG tablet TAKE 1 TABLET BY MOUTH DAILY BEFORE BREAKFAST. 12/02/21 12/02/22 Yes McLean-Scocuzza, Nino Glow, MD  losartan (COZAAR) 100 MG tablet TAKE 1 TABLET (100 MG TOTAL) BY MOUTH DAILY. 12/02/21 12/02/22 Yes McLean-Scocuzza, Nino Glow, MD  naproxen sodium (ALEVE) 220 MG tablet Take 440 mg by mouth daily as needed (pain).   Yes [provider]  budesonide-formoterol (SYMBICORT) 80-4.5 MCG/ACT inhaler Inhale 2 puffs into the lungs 2 (two) times daily. Rinse mouth Patient not taking: Reported on 05/03/2022 12/02/21   McLean-Scocuzza, Nino Glow, MD  COVID-19 mRNA bivalent vaccine, Pfizer, (PFIZER COVID-19 VAC BIVALENT) injection Inject into the muscle. 12/18/21   Carlyle Basques, MD  ipratropium (ATROVENT) 0.06 % nasal spray Place 2 sprays into both nostrils 3 (three) times daily as needed  for rhinitis. 12/02/21   McLean-Scocuzza, Nino Glow, MD    Allergies as of 04/06/2022   (No Known Allergies)    Family History  Problem Relation Age of Onset   Alzheimer's disease Mother    Hypertension Mother    Cancer Mother 17       colon   Lupus Daughter    Heart attack Maternal Grandmother    Breast cancer Other    Breast cancer Maternal Aunt 54   Bladder Cancer Neg Hx    Kidney cancer Neg Hx      Social History   Socioeconomic History   Marital status: Married    Spouse name: Not on file   Number of children: 2   Years of education: Not on file   Highest education level: Not on file  Occupational History   Occupation: Investment banker, 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    From [employer]   As of 11/2019 married 73 years    2 kids       Regular Exercise -  Not at this time   Daily Caffeine Use:  1 cup hot tea daily         Social Determinants of Health   Financial Resource Strain: Not on file  Food Insecurity: Not on file  Transportation Needs: Not on file  Physical Activity: Not on file  Stress: Not on file  Social Connections: Not on file  Intimate Partner Violence: Not on file    Review of Systems: See HPI, otherwise negative ROS  Physical Exam: BP 137/77   Pulse 63   Temp 97.6 F (36.4 C) (Temporal)   Ht '5\' 1"'$  (1.549 m)   Wt 103.4 kg   SpO2 100%   BMI 43.08 kg/m  General:   Alert, cooperative in NAD Head:  Normocephalic and atraumatic. Respiratory:  Normal work of breathing. Cardiovascular:  RRR  Impression/Plan: Barbara Blake is here for cataract surgery.  Risks, benefits, limitations, and alternatives regarding cataract surgery have been reviewed with the patient.  Questions have been answered.  All parties agreeable.   Norvel Richards, MD  05/27/2022, 12:25 PM

## 2022-05-27 NOTE — Transfer of Care (Signed)
Immediate Anesthesia Transfer of Care Note  Patient: Barbara Blake  Procedure(s) Performed: CATARACT EXTRACTION PHACO AND INTRAOCULAR LENS PLACEMENT (IOC) LEFT RAYNER LENS 10.93 01:07.6 (Left: Eye)  Patient Location: PACU  Anesthesia Type: MAC  Level of Consciousness: awake, alert  and patient cooperative  Airway and Oxygen Therapy: Patient Spontanous Breathing and Patient connected to supplemental oxygen  Post-op Assessment: Post-op Vital signs reviewed, Patient's Cardiovascular Status Stable, Respiratory Function Stable, Patent Airway and No signs of Nausea or vomiting  Post-op Vital Signs: Reviewed and stable  Complications: No notable events documented.

## 2022-05-28 ENCOUNTER — Encounter: Payer: Self-pay | Admitting: Ophthalmology

## 2022-05-30 ENCOUNTER — Other Ambulatory Visit: Payer: Self-pay

## 2022-05-31 ENCOUNTER — Other Ambulatory Visit: Payer: Self-pay

## 2022-06-18 ENCOUNTER — Other Ambulatory Visit: Payer: Self-pay

## 2022-06-18 MED ORDER — PREDNISOLONE ACETATE 1 % OP SUSP
OPHTHALMIC | 1 refills | Status: DC
  Filled 2022-06-18: qty 10, 14d supply, fill #0

## 2022-07-02 ENCOUNTER — Other Ambulatory Visit: Payer: Self-pay

## 2022-07-02 MED ORDER — KETOROLAC TROMETHAMINE 0.5 % OP SOLN
OPHTHALMIC | 0 refills | Status: DC
  Filled 2022-07-02: qty 5, 14d supply, fill #0

## 2022-07-12 ENCOUNTER — Other Ambulatory Visit: Payer: Self-pay

## 2022-07-12 ENCOUNTER — Other Ambulatory Visit: Payer: Self-pay | Admitting: Internal Medicine

## 2022-07-12 DIAGNOSIS — F419 Anxiety disorder, unspecified: Secondary | ICD-10-CM

## 2022-07-12 DIAGNOSIS — I10 Essential (primary) hypertension: Secondary | ICD-10-CM

## 2022-07-12 MED ORDER — HYDROCHLOROTHIAZIDE 25 MG PO TABS
25.0000 mg | ORAL_TABLET | Freq: Every day | ORAL | 3 refills | Status: DC
  Filled 2022-07-12: qty 90, 90d supply, fill #0
  Filled 2022-11-02: qty 90, 90d supply, fill #1
  Filled 2023-03-06: qty 90, 90d supply, fill #2
  Filled 2023-06-19: qty 90, 90d supply, fill #3

## 2022-07-12 MED ORDER — ALPRAZOLAM 0.25 MG PO TABS
ORAL_TABLET | ORAL | 5 refills | Status: DC
  Filled 2022-07-12 – 2022-07-22 (×3): qty 30, fill #0
  Filled 2022-08-28: qty 30, 60d supply, fill #0

## 2022-07-12 NOTE — Telephone Encounter (Signed)
Refilled: 12/02/2021 Last OV: 12/02/2021 Next OV: 12/31/2022

## 2022-07-20 ENCOUNTER — Other Ambulatory Visit: Payer: Self-pay

## 2022-07-20 ENCOUNTER — Other Ambulatory Visit
Admission: RE | Admit: 2022-07-20 | Discharge: 2022-07-20 | Disposition: A | Discharge status: Home / Self Care | Payer: No Typology Code available for payment source | Attending: Ophthalmology | Admitting: Ophthalmology

## 2022-07-20 DIAGNOSIS — H15001 Unspecified scleritis, right eye: Secondary | ICD-10-CM | POA: Diagnosis present

## 2022-07-20 LAB — CBC
HCT: 43 % (ref 36.0–46.0)
Hemoglobin: 13.9 g/dL (ref 12.0–15.0)
MCH: 30 pg (ref 26.0–34.0)
MCHC: 32.3 g/dL (ref 30.0–36.0)
MCV: 92.9 fL (ref 80.0–100.0)
Platelets: 257 10*3/uL (ref 150–400)
RBC: 4.63 MIL/uL (ref 3.87–5.11)
RDW: 13.6 % (ref 11.5–15.5)
WBC: 7.4 10*3/uL (ref 4.0–10.5)
nRBC: 0 % (ref 0.0–0.2)

## 2022-07-20 LAB — URIC ACID: Uric Acid, Serum: 4.1 mg/dL (ref 2.5–7.1)

## 2022-07-20 LAB — CREATININE, SERUM
Creatinine, Ser: 0.84 mg/dL (ref 0.44–1.00)
GFR, Estimated: >60 mL/min (ref >60)

## 2022-07-20 LAB — C-REACTIVE PROTEIN: CRP: 0.7 mg/dL (ref <1.0)

## 2022-07-20 LAB — SEDIMENTATION RATE: Sed Rate: 22 mm/hr (ref 0–30)

## 2022-07-20 MED ORDER — PREDNISONE 20 MG PO TABS
ORAL_TABLET | ORAL | 0 refills | Status: DC
  Filled 2022-07-20: qty 84, 21d supply, fill #0

## 2022-07-20 MED ORDER — OMEPRAZOLE 20 MG PO CPDR
20.0000 mg | DELAYED_RELEASE_CAPSULE | Freq: Every day | ORAL | 2 refills | Status: DC
Start: 2022-07-20 — End: 2022-09-23
  Filled 2022-07-20: qty 30, 30d supply, fill #0
  Filled 2022-08-28: qty 30, 30d supply, fill #1

## 2022-07-21 LAB — RPR: RPR Ser Ql: NONREACTIVE

## 2022-07-22 ENCOUNTER — Other Ambulatory Visit: Payer: Self-pay

## 2022-07-22 LAB — ANCA PROFILE
Anti-MPO Antibodies: 1.7 units — ABNORMAL HIGH (ref 0.0–0.9)
Anti-PR3 Antibodies: <0.2 units (ref 0.0–0.9)
Atypical P-ANCA titer: <1:20 {titer}
C-ANCA: <1:20 {titer}
P-ANCA: <1:20 {titer}

## 2022-07-22 LAB — RHEUMATOID FACTOR: Rheumatoid fact SerPl-aCnc: <10 IU/mL (ref <14.0)

## 2022-07-22 LAB — CYCLIC CITRUL PEPTIDE ANTIBODY, IGG/IGA: CCP Antibodies IgG/IgA: 6 units (ref 0–19)

## 2022-07-22 LAB — ANA: Anti Nuclear Antibody (ANA): NEGATIVE

## 2022-07-22 LAB — ANGIOTENSIN CONVERTING ENZYME: Angiotensin-Converting Enzyme: 38 U/L (ref 14–82)

## 2022-07-22 LAB — FLUORESCENT TREPONEMAL AB(FTA)-IGG-BLD: Fluorescent Treponemal Ab, IgG: NONREACTIVE

## 2022-07-27 ENCOUNTER — Telehealth: Payer: Self-pay | Admitting: Internal Medicine

## 2022-07-27 NOTE — Telephone Encounter (Signed)
[facility] calling stating pt asked  for a network exception  to make an appointment outside of [facility] and I let them know I did not see that in her chart. I also let her know that Olivia Mackie is out of the office

## 2022-07-28 ENCOUNTER — Telehealth: Payer: Self-pay

## 2022-07-28 NOTE — Telephone Encounter (Signed)
Olivia Mackie from [facility] called. She stated that they are getting kick back from the insurance because they are a specialty trying to refer to another specialty. The insurance told them they need it to be sent through her PCP. Olivia Mackie is going to fax over the Dr. Katherine Basset as well as some notes where they are  trying to refer to and see if it goes through.  ===View-only below this line=== ----- Message ----- From: Kennyth Arnold, FNP Sent: 07/27/2022   9:45 PM EDT To: Gracy Racer, [place] me posted ----- Message ----- From: Gracy Racer, CMA Sent: 07/27/2022   1:57 PM EDT To: Kennyth Arnold, FNP  Spoke with pt. She said her Ophthalmologist  wanted her to see a [facility]. She has an appt with her Ophthalmologist on this Friday to see if he even still wants her to go to one she won't know until Friday. She had cataract surgery and it's left her with some eye issues so the ophthalmologist is trying to get it under control. But she may have to go to the specialist who is out of network ----- Message ----- From: Kennyth Arnold, FNP Sent: 07/27/2022   1:29 PM EDT To: Gracy Racer, CMA  Can you ask the patient why she is requesting an exception?  ----- Message ----- From: Gracy Racer, CMA Sent: 07/27/2022  10:55 AM EDT To: Kennyth Arnold, FNP  Vernie Murders, how should I go about handling this ? ----- Message ----- From: Wandra Scot Sent: 07/27/2022  10:28 AM EDT To: Gracy Racer, CMA

## 2022-07-28 NOTE — Telephone Encounter (Signed)
Barbara Blake called from [facility] regarding a requested network exception through the doctor who performed her cataract surgery, Dr. Merleen Nicely.

## 2022-07-29 NOTE — Telephone Encounter (Signed)
I spoke with Jenny Reichmann from [facility] focus ins, she stated that they're going to need the medical records to support the ophthalmologist issue and a letter why pt needs to go out of [facility] focus plan to fax number [phone] attn Cindy. Thank you!

## 2022-07-29 NOTE — Telephone Encounter (Signed)
Lft Cindy at [employer] a vm to call ofc. thanks

## 2022-08-02 ENCOUNTER — Other Ambulatory Visit: Payer: Self-pay

## 2022-08-02 MED ORDER — PREDNISOLONE ACETATE 1 % OP SUSP
OPHTHALMIC | 3 refills | Status: DC
  Filled 2022-08-02: qty 10, 30d supply, fill #0

## 2022-08-02 MED ORDER — KETOROLAC TROMETHAMINE 0.5 % OP SOLN
OPHTHALMIC | 3 refills | Status: DC
  Filled 2022-08-02: qty 5, 18d supply, fill #0
  Filled 2022-08-28: qty 5, 10d supply, fill #0

## 2022-08-03 ENCOUNTER — Other Ambulatory Visit: Payer: Self-pay

## 2022-08-03 NOTE — Telephone Encounter (Signed)
[facility] Cindy called to follow up on the ofc notes and letter from pt being referred to another eye dr from [facility]. I advised to [facility] that [facility] will provide the ofc notes and the letter. Jenny Reichmann stated it is to come from pt provider. I did advise to [facility] what Olivia Mackie said from [facility]. I also gave cindy the number to [facility] to speak to [place].

## 2022-08-04 ENCOUNTER — Other Ambulatory Visit: Payer: Self-pay

## 2022-08-04 MED ORDER — KETOROLAC TROMETHAMINE 0.4 % OP SOLN
OPHTHALMIC | 0 refills | Status: DC
  Filled 2022-08-04: qty 5, 18d supply, fill #0

## 2022-08-29 ENCOUNTER — Other Ambulatory Visit: Payer: Self-pay

## 2022-08-30 ENCOUNTER — Other Ambulatory Visit: Payer: Self-pay

## 2022-09-23 ENCOUNTER — Ambulatory Visit (INDEPENDENT_AMBULATORY_CARE_PROVIDER_SITE_OTHER): Payer: No Typology Code available for payment source | Admitting: Family Medicine

## 2022-09-23 ENCOUNTER — Encounter: Payer: Self-pay | Admitting: Family Medicine

## 2022-09-23 ENCOUNTER — Other Ambulatory Visit: Payer: Self-pay

## 2022-09-23 VITALS — BP 128/84 | HR 103 | Temp 98.4°F | Ht 61.0 in | Wt 231.8 lb

## 2022-09-23 DIAGNOSIS — R918 Other nonspecific abnormal finding of lung field: Secondary | ICD-10-CM

## 2022-09-23 DIAGNOSIS — K219 Gastro-esophageal reflux disease without esophagitis: Secondary | ICD-10-CM

## 2022-09-23 DIAGNOSIS — J4520 Mild intermittent asthma, uncomplicated: Secondary | ICD-10-CM

## 2022-09-23 DIAGNOSIS — J302 Other seasonal allergic rhinitis: Secondary | ICD-10-CM

## 2022-09-23 DIAGNOSIS — Z6841 Body Mass Index (BMI) 40.0 and over, adult: Secondary | ICD-10-CM

## 2022-09-23 DIAGNOSIS — I10 Essential (primary) hypertension: Secondary | ICD-10-CM | POA: Diagnosis not present

## 2022-09-23 DIAGNOSIS — F419 Anxiety disorder, unspecified: Secondary | ICD-10-CM

## 2022-09-23 DIAGNOSIS — E039 Hypothyroidism, unspecified: Secondary | ICD-10-CM

## 2022-09-23 MED ORDER — FAMOTIDINE 20 MG PO TABS
20.0000 mg | ORAL_TABLET | Freq: Two times a day (BID) | ORAL | 0 refills | Status: DC
  Filled 2022-09-23: qty 60, 30d supply, fill #0

## 2022-09-23 MED ORDER — VITAMIN D 25 MCG (1000 UNIT) PO TABS: 5000.0000 [IU] | ORAL_TABLET | Freq: Every day | ORAL | Status: DC

## 2022-09-23 NOTE — Progress Notes (Signed)
SUBJECTIVE:   CHIEF COMPLAINT / HPI: transfer care  Patient presents to clinic to transfer care  No acute concerns  HTN Asymptomatic. Tolerating  medications well.  Denies any visual changes, headaches, chest pain, shortness of breath or lower extremity edema.  Chronic cough Longstanding history of cough.  History of Asthma, has been taking Albuterol and increased use recently thinking it may help with cough.  Has  not been taking Symbicort secondary to cost.  Cough worse at night when laying down. Cough nonproductive.  Endorses some posterior mucus production   GERD Not currently on medication.  Was taking Omeprazole while on taper dose Prednisone recently but now stopped.  Reports had not noticed if had any cough during this time.  Denies any nausea, vomiting, weight loss.  Endorses increase in weight.  Weight management Is interested in weight loss management.   PERTINENT  PMH / PSH:  HTN Hypothyroid Asthma (mild, intermittent) Pulmonary nodules  OBJECTIVE:   BP 128/84 (BP Location: Left Arm, Patient Position: Sitting, Cuff Size: Large)   Pulse (!) 103   Temp 98.4 F (36.9 C) (Oral)   Ht '5\' 1"'$  (1.549 m)   Wt 231 lb 12.8 oz (105.1 kg)   SpO2 97%   BMI 43.80 kg/m    General: Alert, no acute distress Cardio: Normal S1 and S2, RRR, no r/m/g Pulm: CTAB, normal work of breathing Abdomen: Bowel sounds normal. Abdomen soft and non-tender.  Extremities: No peripheral edema.  Neuro: Cranial nerves grossly intact   ASSESSMENT/PLAN:   Hypertension Asymptomatic. Not quite at goal <120/80.  Tolerating medication.   -Continue Amlodipine 5 mg daily -Continue Losartan 100 mg daily -Recommend to take BP meds at night to optimize BP  -Monitor BP at home, recordings and will review at next visit -Plan for labs at next visit -Follow up in 2 months   Pulmonary nodules Reviewed follow up CT chest from 12/12/2019 showing scattered tiny pulmonary nodules, some calcified  and were likely secondary to postinflammatory changes.  The nodule seen on previous CT from 2019 remained consistent with postinfectious process and remained stable.  No further work up was recommended.   -If becomes symptomatic will reimage    Asthma, mild intermittent Chronic cough.  Has increased use of Albuterol which helps.  No wheezing or shortness of breath. Lungs clear on exam today.  -Continue Albuterol q4-6h as needed -Can restart Symbicort 80-4.45mg BID, will check with pharmacy team for cheaper equivalant  -Follow up in 2 months -Strict return precautions provided   Allergic rhinitis Chronic. Using nasal spray which helps. -Continue Atrovent nasal spray   Gastroesophageal reflux disease Chronic cough for years.  Worse at night. Fluctuations in weight.  Was taking Omeprazole while on steroids recently and had not noticed cough during this time. -Will trial Pepcid 20 mg BID x 4-8 weeks -Follow up in 2 months  Hypothyroidism Asymptomatic.  Tolerating medication. Last TSH 11/2021, 5.21 -Continue Levothyroxine 1738m daily -Repeat TSH  in 2 months  Morbid obesity with BMI of 40.0-44.9, adult [place] discussion with patient about nutrition and lifestyle.  Plan to continue discussion at future visits.   -Lifestyle management and increase exercise -Set realistic goals to achieve weekly -Increase water intake  -Follow up in 2 months   Anxiety Stable.  Rarely takes Xanax.  Discussed with patient weaning medication as not recommended in advancing age and risks associated with medication.  Patient ok to stop medication. Had not taken Xanax for more than 1 month.  No concern for withdrawal. -Discontinue Xanax -Follow up in 2 months    PDMP Reviewed Carollee Leitz, MD

## 2022-09-23 NOTE — Patient Instructions (Signed)
Thank you for allowing me to take part in your health care.  Our goals for today as we discussed include:  For your cough Start Pepcid 20 mg twice a day   For your blood pressure Continue Hydrochlorothiazide 25 mg daily Continue Losartan 100 mg at night Continue Amlodipine 5 mg at night  Stop Xanax If needing Atarax please MyChart me  Please bring in your immunization record at your next visit  Recommend RSV vaccine Recommend Pneumonia 20 vaccine if not given.  Please follow-up with PCP in 2 months as scheduled for annual visit  If you have any questions or concerns, please do not hesitate to call the office at [phone].  I look forward to our next visit and until then take care and stay safe.  Regards,   Carollee Leitz, MD   [place]   Gastroesophageal Reflux Disease, Adult  Gastroesophageal reflux (GER) happens when acid from the stomach flows up into the tube that connects the mouth and the stomach (esophagus). Normally, food travels down the esophagus and stays in the stomach to be digested. With GER, food and stomach acid sometimes move back up into the esophagus. You may have a disease called gastroesophageal reflux disease (GERD) if the reflux: Happens often. Causes frequent or very bad symptoms. Causes problems such as damage to the esophagus. When this happens, the esophagus becomes sore and swollen. Over time, GERD can make small holes (ulcers) in the lining of the esophagus. What are the causes? This condition is caused by a problem with the muscle between the esophagus and the stomach. When this muscle is weak or not normal, it does not close properly to keep food and acid from coming back up from the stomach. The muscle can be weak because of: Tobacco use. Pregnancy. Having a certain type of hernia (hiatal hernia). Alcohol use. Certain foods and drinks, such as coffee, chocolate, onions, and peppermint. What increases the risk? Being  overweight. Having a disease that affects your connective tissue. Taking NSAIDs, such a ibuprofen. What are the signs or symptoms? Heartburn. Difficult or painful swallowing. The feeling of having a lump in the throat. A bitter taste in the mouth. Bad breath. Having a lot of saliva. Having an upset or bloated stomach. Burping. Chest pain. Different conditions can cause chest pain. Make sure you see your doctor if you have chest pain. Shortness of breath or wheezing. A long-term cough or a cough at night. Wearing away of the surface of teeth (tooth enamel). Weight loss. How is this treated? Making changes to your diet. Taking medicine. Having surgery. Treatment will depend on how bad your symptoms are. Follow these instructions at home: Eating and drinking  Follow a diet as told by your doctor. You may need to avoid foods and drinks such as: Coffee and tea, with or without caffeine. Drinks that contain alcohol. Energy drinks and sports drinks. Bubbly (carbonated) drinks or sodas. Chocolate and cocoa. Peppermint and mint flavorings. Garlic and onions. Horseradish. Spicy and acidic foods. These include peppers, chili powder, curry powder, vinegar, hot sauces, and BBQ sauce. Citrus fruit juices and citrus fruits, such as oranges, lemons, and limes. Tomato-based foods. These include red sauce, chili, salsa, and pizza with red sauce. Fried and fatty foods. These include donuts, french fries, potato chips, and high-fat dressings. High-fat meats. These include hot dogs, rib eye steak, sausage, ham, and bacon. High-fat dairy items, such as whole milk, butter, and cream cheese. Eat small meals often. Avoid eating  large meals. Avoid drinking large amounts of liquid with your meals. Avoid eating meals during the 2-3 hours before bedtime. Avoid lying down right after you eat. Do not exercise right after you eat. Lifestyle  Do not smoke or use any products that contain nicotine or  tobacco. If you need help quitting, ask your doctor. Try to lower your stress. If you need help doing this, ask your doctor. If you are overweight, lose an amount of weight that is healthy for you. Ask your doctor about a safe weight loss goal. General instructions Pay attention to any changes in your symptoms. Take over-the-counter and prescription medicines only as told by your doctor. Do not take aspirin, ibuprofen, or other NSAIDs unless your doctor says it is okay. Wear loose clothes. Do not wear anything tight around your waist. Raise (elevate) the head of your bed about 6 inches (15 cm). You may need to use a wedge to do this. Avoid bending over if this makes your symptoms worse. Keep all follow-up visits. Contact a doctor if: You have new symptoms. You lose weight and you do not know why. You have trouble swallowing or it hurts to swallow. You have wheezing or a cough that keeps happening. You have a hoarse voice. Your symptoms do not get better with treatment. Get help right away if: You have sudden pain in your arms, neck, jaw, teeth, or back. You suddenly feel sweaty, dizzy, or light-headed. You have chest pain or shortness of breath. You vomit and the vomit is green, yellow, or black, or it looks like blood or coffee grounds. You faint. Your poop (stool) is red, bloody, or black. You cannot swallow, drink, or eat. These symptoms may represent a serious problem that is an emergency. Do not wait to see if the symptoms will go away. Get medical help right away. Call your local emergency services (911 in the [place] Do not drive yourself to the hospital. Summary If a person has gastroesophageal reflux disease (GERD), food and stomach acid move back up into the esophagus and cause symptoms or problems such as damage to the esophagus. Treatment will depend on how bad your symptoms are. Follow a diet as told by your doctor. Take all medicines only as told by your doctor. This  information is not intended to replace advice given to you by your health care provider. Make sure you discuss any questions you have with your health care provider. Document Revised: 06/09/2020 Document Reviewed: 06/09/2020 Elsevier Patient Education  [place].

## 2022-09-30 NOTE — Assessment & Plan Note (Signed)
Reviewed follow up CT chest from 12/12/2019 showing scattered tiny pulmonary nodules, some calcified and were likely secondary to postinflammatory changes.  The nodule seen on previous CT from 2019 remained consistent with postinfectious process and remained stable.  No further work up was recommended.   -If becomes symptomatic will reimage

## 2022-09-30 NOTE — Assessment & Plan Note (Addendum)
Long discussion with patient about nutrition and lifestyle.  Plan to continue discussion at future visits.   -Lifestyle management and increase exercise -Set realistic goals to achieve weekly -Increase water intake  -Follow up in 2 months

## 2022-09-30 NOTE — Assessment & Plan Note (Signed)
Asymptomatic. Not quite at goal <120/80.  Tolerating medication.   -Continue Amlodipine 5 mg daily -Continue Losartan 100 mg daily -Recommend to take BP meds at night to optimize BP  -Monitor BP at home, recordings and will review at next visit -Plan for labs at next visit -Follow up in 2 months

## 2022-09-30 NOTE — Assessment & Plan Note (Signed)
Asymptomatic.  Tolerating medication. Last TSH 11/2021, 5.21 -Continue Levothyroxine 158mg daily -Repeat TSH  in 2 months

## 2022-09-30 NOTE — Assessment & Plan Note (Signed)
Chronic cough for years.  Worse at night. Fluctuations in weight.  Was taking Omeprazole while on steroids recently and had not noticed cough during this time. -Will trial Pepcid 20 mg BID x 4-8 weeks -Follow up in 2 months

## 2022-09-30 NOTE — Assessment & Plan Note (Signed)
Chronic. Using nasal spray which helps. -Continue Atrovent nasal spray

## 2022-09-30 NOTE — Assessment & Plan Note (Signed)
Chronic cough.  Has increased use of Albuterol which helps.  No wheezing or shortness of breath. Lungs clear on exam today.  -Continue Albuterol q4-6h as needed -Can restart Symbicort 80-4.26mg BID, will check with pharmacy team for cheaper equivalant  -Follow up in 2 months -Strict return precautions provided

## 2022-10-02 ENCOUNTER — Encounter: Payer: Self-pay | Admitting: Family Medicine

## 2022-10-02 NOTE — Assessment & Plan Note (Signed)
Stable.  Rarely takes Xanax.  Discussed with patient weaning medication as not recommended in advancing age and risks associated with medication.  Patient ok to stop medication. Had not taken Xanax for more than 1 month.  No concern for withdrawal. -Discontinue Xanax -Follow up in 2 months

## 2022-10-04 ENCOUNTER — Other Ambulatory Visit: Payer: Self-pay

## 2022-10-04 MED ORDER — PREDNISOLONE ACETATE 1 % OP SUSP
OPHTHALMIC | 0 refills | Status: DC
  Filled 2022-10-04: qty 10, 30d supply, fill #0

## 2022-10-20 ENCOUNTER — Other Ambulatory Visit
Admission: RE | Admit: 2022-10-20 | Discharge: 2022-10-20 | Disposition: A | Discharge status: Home / Self Care | Payer: No Typology Code available for payment source | Source: Ambulatory Visit | Attending: Ophthalmology | Admitting: Ophthalmology

## 2022-10-20 ENCOUNTER — Other Ambulatory Visit: Payer: Self-pay

## 2022-10-20 MED ORDER — PREDNISONE 20 MG PO TABS
80.0000 mg | ORAL_TABLET | Freq: Every day | ORAL | 1 refills | Status: DC
  Filled 2022-10-20: qty 56, 14d supply, fill #0

## 2022-10-20 MED ORDER — DIFLUPREDNATE 0.05 % OP EMUL
OPHTHALMIC | 0 refills | Status: DC
  Filled 2022-10-20: qty 5, 12d supply, fill #0

## 2022-10-21 ENCOUNTER — Other Ambulatory Visit
Admission: RE | Admit: 2022-10-21 | Discharge: 2022-10-21 | Disposition: A | Discharge status: Home / Self Care | Payer: No Typology Code available for payment source | Source: Ambulatory Visit | Attending: Ophthalmology | Admitting: Ophthalmology

## 2022-10-21 ENCOUNTER — Other Ambulatory Visit: Payer: Self-pay

## 2022-10-21 DIAGNOSIS — H15002 Unspecified scleritis, left eye: Secondary | ICD-10-CM | POA: Diagnosis present

## 2022-10-21 LAB — CBC
HCT: 40.8 % (ref 36.0–46.0)
Hemoglobin: 13.6 g/dL (ref 12.0–15.0)
MCH: 30.2 pg (ref 26.0–34.0)
MCHC: 33.3 g/dL (ref 30.0–36.0)
MCV: 90.7 fL (ref 80.0–100.0)
Platelets: 237 10*3/uL (ref 150–400)
RBC: 4.5 MIL/uL (ref 3.87–5.11)
RDW: 13 % (ref 11.5–15.5)
WBC: 6.5 10*3/uL (ref 4.0–10.5)
nRBC: 0 % (ref 0.0–0.2)

## 2022-10-21 LAB — URINALYSIS, COMPLETE (UACMP) WITH MICROSCOPIC
Bacteria, UA: NONE SEEN
Bilirubin Urine: NEGATIVE
Glucose, UA: NEGATIVE mg/dL
Hgb urine dipstick: NEGATIVE
Ketones, ur: NEGATIVE mg/dL
Nitrite: NEGATIVE
Protein, ur: NEGATIVE mg/dL
Specific Gravity, Urine: 1.029 (ref 1.005–1.030)
pH: 5 (ref 5.0–8.0)

## 2022-10-21 LAB — CREATININE, SERUM
Creatinine, Ser: 0.69 mg/dL (ref 0.44–1.00)
GFR, Estimated: >60 mL/min

## 2022-10-21 LAB — C-REACTIVE PROTEIN: CRP: 0.6 mg/dL

## 2022-10-21 LAB — SEDIMENTATION RATE: Sed Rate: 25 mm/h (ref 0–30)

## 2022-10-21 LAB — URIC ACID: Uric Acid, Serum: 4.4 mg/dL (ref 2.5–7.1)

## 2022-10-22 ENCOUNTER — Other Ambulatory Visit: Payer: Self-pay

## 2022-10-22 LAB — RPR: RPR Ser Ql: NONREACTIVE

## 2022-10-23 LAB — CYCLIC CITRUL PEPTIDE ANTIBODY, IGG/IGA: CCP Antibodies IgG/IgA: <1 units (ref 0–19)

## 2022-10-23 LAB — ANA: Anti Nuclear Antibody (ANA): NEGATIVE

## 2022-10-24 LAB — RHEUMATOID FACTOR: Rheumatoid fact SerPl-aCnc: <10 IU/mL (ref <14.0)

## 2022-10-25 ENCOUNTER — Other Ambulatory Visit: Payer: Self-pay

## 2022-10-25 LAB — LYSOZYME, SERUM: Lysozyme: 5 ug/mL (ref 3.6–8.1)

## 2022-10-25 LAB — ACETYLCHOLINE RECEPTOR, BINDING: Acety choline binding ab: <0.03 nmol/L (ref 0.00–0.24)

## 2022-10-25 LAB — FLUORESCENT TREPONEMAL AB(FTA)-IGG-BLD: Fluorescent Treponemal Ab, IgG: NONREACTIVE

## 2022-10-25 MED ORDER — COVID-19 MRNA VAC-TRIS(PFIZER) 30 MCG/0.3ML IM SUSY
PREFILLED_SYRINGE | INTRAMUSCULAR | 0 refills | Status: DC
  Filled 2022-10-29: qty 0.3, 1d supply, fill #0

## 2022-10-26 ENCOUNTER — Other Ambulatory Visit: Payer: Self-pay

## 2022-10-26 LAB — ANCA PROFILE
Anti-MPO Antibodies: 2 units — ABNORMAL HIGH (ref 0.0–0.9)
Anti-PR3 Antibodies: <0.2 units (ref 0.0–0.9)
Atypical P-ANCA titer: <1:20 {titer}
C-ANCA: <1:20 {titer}
P-ANCA: <1:20 {titer}

## 2022-10-26 MED ORDER — AZATHIOPRINE 50 MG PO TABS
50.0000 mg | ORAL_TABLET | Freq: Every day | ORAL | 3 refills | Status: DC
  Filled 2022-10-26: qty 30, 30d supply, fill #0

## 2022-10-27 ENCOUNTER — Other Ambulatory Visit
Admission: RE | Admit: 2022-10-27 | Discharge: 2022-10-27 | Disposition: A | Discharge status: Home / Self Care | Payer: No Typology Code available for payment source | Attending: Ophthalmology | Admitting: Ophthalmology

## 2022-10-27 DIAGNOSIS — Z01818 Encounter for other preprocedural examination: Secondary | ICD-10-CM | POA: Diagnosis present

## 2022-10-27 LAB — HEPATITIS PANEL, ACUTE
HCV Ab: NONREACTIVE
Hep A IgM: NONREACTIVE
Hep B C IgM: NONREACTIVE
Hepatitis B Surface Ag: NONREACTIVE

## 2022-10-27 LAB — HIV ANTIBODY (ROUTINE TESTING W REFLEX): HIV Screen 4th Generation wRfx: NONREACTIVE

## 2022-10-28 ENCOUNTER — Other Ambulatory Visit: Payer: Self-pay

## 2022-10-28 LAB — MISC LABCORP TEST (SEND OUT)
LabCorp test name: 142455
Labcorp test code: 142455

## 2022-10-28 LAB — ANTI-DNA ANTIBODY, DOUBLE-STRANDED: ds DNA Ab: 1 IU/mL (ref 0–9)

## 2022-10-28 LAB — ANCA PROFILE
Anti-MPO Antibodies: 2.4 units — ABNORMAL HIGH (ref 0.0–0.9)
Anti-PR3 Antibodies: <0.2 units (ref 0.0–0.9)
Atypical P-ANCA titer: <1:20 {titer}
C-ANCA: <1:20 {titer}
P-ANCA: <1:20 {titer}

## 2022-10-28 LAB — RPR: RPR Ser Ql: NONREACTIVE

## 2022-10-28 LAB — LYME DISEASE SEROLOGY W/REFLEX: Lyme Total Antibody EIA: NEGATIVE

## 2022-10-28 LAB — ANGIOTENSIN CONVERTING ENZYME: Angiotensin-Converting Enzyme: 45 U/L (ref 14–82)

## 2022-10-28 LAB — ANA: Anti Nuclear Antibody (ANA): NEGATIVE

## 2022-10-29 ENCOUNTER — Other Ambulatory Visit: Payer: Self-pay

## 2022-10-29 ENCOUNTER — Telehealth: Payer: Self-pay | Admitting: Family Medicine

## 2022-10-29 DIAGNOSIS — H209 Unspecified iridocyclitis: Secondary | ICD-10-CM

## 2022-10-29 LAB — QUANTIFERON-TB GOLD PLUS (RQFGPL)
QuantiFERON Mitogen Value: >10 IU/mL
QuantiFERON Nil Value: 0 IU/mL
QuantiFERON TB1 Ag Value: 0 IU/mL
QuantiFERON TB2 Ag Value: 0 IU/mL

## 2022-10-29 LAB — QUANTIFERON-TB GOLD PLUS: QuantiFERON-TB Gold Plus: NEGATIVE

## 2022-10-29 NOTE — Telephone Encounter (Signed)
Patient went to [facility] and they want to send her to Dr Brigitte Pulse for Uveitis, at [facility]. Patient's insurance told her she needed a referral from her provider and to date it 10/26/2022. Phone number for Dr Raul Del office is [phone].

## 2022-11-01 ENCOUNTER — Other Ambulatory Visit: Payer: Self-pay

## 2022-11-02 ENCOUNTER — Other Ambulatory Visit: Payer: Self-pay | Admitting: Family Medicine

## 2022-11-02 DIAGNOSIS — K219 Gastro-esophageal reflux disease without esophagitis: Secondary | ICD-10-CM

## 2022-11-02 NOTE — Addendum Note (Signed)
Addended by: Fulton Mole D on: 11/02/2022 03:40 PM   Modules accepted: Orders

## 2022-11-03 ENCOUNTER — Other Ambulatory Visit: Payer: Self-pay

## 2022-11-03 LAB — HLA A,B,C (IR)

## 2022-11-03 NOTE — Telephone Encounter (Signed)
Can send in refill for 120 tabs

## 2022-11-05 ENCOUNTER — Other Ambulatory Visit: Payer: Self-pay | Admitting: Family Medicine

## 2022-11-05 ENCOUNTER — Other Ambulatory Visit: Payer: Self-pay

## 2022-11-05 DIAGNOSIS — K219 Gastro-esophageal reflux disease without esophagitis: Secondary | ICD-10-CM

## 2022-11-08 ENCOUNTER — Other Ambulatory Visit: Payer: Self-pay

## 2022-11-08 MED FILL — Famotidine Tab 20 MG: ORAL | 60 days supply | Qty: 120 | Fill #0 | Status: AC

## 2022-11-14 ENCOUNTER — Other Ambulatory Visit: Payer: Self-pay

## 2022-11-24 ENCOUNTER — Ambulatory Visit: Payer: No Typology Code available for payment source | Admitting: Family Medicine

## 2022-11-24 DIAGNOSIS — E039 Hypothyroidism, unspecified: Secondary | ICD-10-CM

## 2022-11-24 DIAGNOSIS — I10 Essential (primary) hypertension: Secondary | ICD-10-CM

## 2022-11-24 DIAGNOSIS — K219 Gastro-esophageal reflux disease without esophagitis: Secondary | ICD-10-CM

## 2022-12-08 ENCOUNTER — Ambulatory Visit (INDEPENDENT_AMBULATORY_CARE_PROVIDER_SITE_OTHER): Payer: No Typology Code available for payment source | Admitting: Family Medicine

## 2022-12-08 ENCOUNTER — Other Ambulatory Visit: Payer: Self-pay

## 2022-12-08 ENCOUNTER — Encounter: Payer: Self-pay | Admitting: Family Medicine

## 2022-12-08 VITALS — BP 122/78 | HR 76 | Temp 98.5°F | Ht 61.0 in | Wt 230.6 lb

## 2022-12-08 DIAGNOSIS — E039 Hypothyroidism, unspecified: Secondary | ICD-10-CM | POA: Diagnosis not present

## 2022-12-08 DIAGNOSIS — E559 Vitamin D deficiency, unspecified: Secondary | ICD-10-CM

## 2022-12-08 DIAGNOSIS — J4520 Mild intermittent asthma, uncomplicated: Secondary | ICD-10-CM

## 2022-12-08 DIAGNOSIS — I10 Essential (primary) hypertension: Secondary | ICD-10-CM

## 2022-12-08 DIAGNOSIS — Z0000 Encounter for general adult medical examination without abnormal findings: Secondary | ICD-10-CM | POA: Diagnosis not present

## 2022-12-08 DIAGNOSIS — Z6841 Body Mass Index (BMI) 40.0 and over, adult: Secondary | ICD-10-CM | POA: Diagnosis not present

## 2022-12-08 DIAGNOSIS — K219 Gastro-esophageal reflux disease without esophagitis: Secondary | ICD-10-CM

## 2022-12-08 DIAGNOSIS — Z1231 Encounter for screening mammogram for malignant neoplasm of breast: Secondary | ICD-10-CM

## 2022-12-08 LAB — CBC WITH DIFFERENTIAL/PLATELET
Basophils Absolute: 0.1 10*3/uL (ref 0.0–0.1)
Basophils Relative: 1.1 % (ref 0.0–3.0)
Eosinophils Absolute: 0.1 10*3/uL (ref 0.0–0.7)
Eosinophils Relative: 1.4 % (ref 0.0–5.0)
HCT: 47.1 % — ABNORMAL HIGH (ref 36.0–46.0)
Hemoglobin: 15.7 g/dL — ABNORMAL HIGH (ref 12.0–15.0)
Lymphocytes Relative: 28.3 % (ref 12.0–46.0)
Lymphs Abs: 1.8 10*3/uL (ref 0.7–4.0)
MCHC: 33.3 g/dL (ref 30.0–36.0)
MCV: 93 fl (ref 78.0–100.0)
Monocytes Absolute: 0.4 10*3/uL (ref 0.1–1.0)
Monocytes Relative: 6.4 % (ref 3.0–12.0)
Neutro Abs: 4.1 10*3/uL (ref 1.4–7.7)
Neutrophils Relative %: 62.8 % (ref 43.0–77.0)
Platelets: 275 10*3/uL (ref 150.0–400.0)
RBC: 5.07 Mil/uL (ref 3.87–5.11)
RDW: 14.3 % (ref 11.5–15.5)
WBC: 6.5 10*3/uL (ref 4.0–10.5)

## 2022-12-08 LAB — VITAMIN D 25 HYDROXY (VIT D DEFICIENCY, FRACTURES): VITD: 34.41 ng/mL (ref 30.00–100.00)

## 2022-12-08 LAB — LIPID PANEL
Cholesterol: 206 mg/dL — ABNORMAL HIGH (ref 0–200)
HDL: 58 mg/dL (ref >39.00)
LDL Cholesterol: 123 mg/dL — ABNORMAL HIGH (ref 0–99)
NonHDL: 148.31
Total CHOL/HDL Ratio: 4
Triglycerides: 129 mg/dL (ref 0.0–149.0)
VLDL: 25.8 mg/dL (ref 0.0–40.0)

## 2022-12-08 LAB — VITAMIN B12: Vitamin B-12: 275 pg/mL (ref 211–911)

## 2022-12-08 LAB — COMPREHENSIVE METABOLIC PANEL
ALT: 20 U/L (ref 0–35)
AST: 19 U/L (ref 0–37)
Albumin: 4.2 g/dL (ref 3.5–5.2)
Alkaline Phosphatase: 84 U/L (ref 39–117)
BUN: 20 mg/dL (ref 6–23)
CO2: 30 mEq/L (ref 19–32)
Calcium: 10 mg/dL (ref 8.4–10.5)
Chloride: 103 mEq/L (ref 96–112)
Creatinine, Ser: 0.92 mg/dL (ref 0.40–1.20)
GFR: 65.42 mL/min (ref >60.00)
Glucose, Bld: 94 mg/dL (ref 70–99)
Potassium: 4.3 mEq/L (ref 3.5–5.1)
Sodium: 140 mEq/L (ref 135–145)
Total Bilirubin: 1.2 mg/dL (ref 0.2–1.2)
Total Protein: 7.3 g/dL (ref 6.0–8.3)

## 2022-12-08 LAB — TSH: TSH: 0.39 u[IU]/mL (ref 0.35–5.50)

## 2022-12-08 LAB — HEMOGLOBIN A1C: Hgb A1c MFr Bld: 5.5 % (ref 4.6–6.5)

## 2022-12-08 NOTE — Progress Notes (Signed)
   SUBJECTIVE:   Chief Complaint  Patient presents with   Annual Exam   HPI Patient presents to clinic for annual physical  No acute concerns.  Hypertension Controlled with current medications.  Does not check blood pressure at home and is currently asymptomatic.  Hypothyroidism Asymptomatic.  Controlled with levothyroxine 175 mcg daily.  Needs repeat TSH.  PERTINENT PMH / PSH: Hypertension Obesity class III   OBJECTIVE:  BP 122/78   Pulse 76   Temp 98.5 F (36.9 C)   Ht '5\' 1"'$  (1.549 m)   Wt 230 lb 9.6 oz (104.6 kg)   SpO2 99%   BMI 43.57 kg/m    Physical Exam Vitals reviewed.  Constitutional:      General: She is not in acute distress.    Appearance: She is normal weight. She is not ill-appearing.  HENT:     Head: Normocephalic.  Eyes:     Conjunctiva/sclera: Conjunctivae normal.  Cardiovascular:     Rate and Rhythm: Normal rate and regular rhythm.     Pulses: Normal pulses.  Pulmonary:     Effort: Pulmonary effort is normal.     Breath sounds: Normal breath sounds.  Abdominal:     General: Bowel sounds are normal.  Neurological:     Mental Status: She is alert. Mental status is at baseline.  Psychiatric:        Mood and Affect: Mood normal.        Behavior: Behavior normal.        Thought Content: Thought content normal.        Judgment: Judgment normal.     ASSESSMENT/PLAN:  Annual physical exam Assessment & Plan: Healthy 66 year old female Pap up-to-date.  ASCUS 2022.  Follows with OB/GYN. Referral for mammogram  DEXA completed Colonoscopy up-to-date.  Recommend follow-up in 7 years.  Due 2029 Shingles completed Pneumonia declined      Orders: -     CBC with Differential/Platelet -     Lipid panel  Primary hypertension Assessment & Plan: Chronic.  Stable.  Asymptomatic.  -Continue Amlodipine 5 mg daily -Continue Losartan 100 mg daily -Recommend to take BP meds at night to optimize BP  -Monitor BP at home, recordings and will  review at next visit   Orders: -     Comprehensive metabolic panel  Hypothyroidism, unspecified type Assessment & Plan: Chronic.  Stable. Continue levothyroxine 125 mcg daily Labs today  Orders: -     TSH -     Levothyroxine Sodium; TAKE 1 TABLET BY MOUTH DAILY BEFORE BREAKFAST.  Dispense: 90 tablet; Refill: 3  Vitamin D deficiency -     VITAMIN D 25 Hydroxy (Vit-D Deficiency, Fractures)  Morbid obesity with BMI of 40.0-44.9, adult (HCC) -     Hemoglobin A1c -     Vitamin B12  Breast cancer screening by mammogram -     3D Screening Mammogram, Left and Right; Future  Essential hypertension -     Losartan Potassium; TAKE 1 TABLET (100 MG TOTAL) BY MOUTH DAILY.  Dispense: 90 tablet; Refill: 3 -     amLODIPine Besylate; TAKE 1 TABLET (5 MG TOTAL) BY MOUTH EVERY MORNING.  Dispense: 90 tablet; Refill: 3   PDMP reviewed  Return in about 3 months (around 03/09/2023) for PCP.  Carollee Leitz, MD

## 2022-12-08 NOTE — Patient Instructions (Addendum)
It was a pleasure meeting you today. Thank you for allowing me to take part in your health care.  Our goals for today as we discussed include:  We will get some labs today.  If they are abnormal or we need to do something about them, I will call you.  If they are normal, I will send you a message on MyChart (if it is active) or a letter in the mail.  If you don't hear from [place] in 2 weeks, please call the office at the number below.   Schedule appointment for 3 months   If you have any questions or concerns, please do not hesitate to call the office at [phone].  I look forward to our next visit and until then take care and stay safe.  Regards,   Carollee Leitz, MD   [facility]

## 2022-12-09 ENCOUNTER — Encounter: Payer: No Typology Code available for payment source | Admitting: Family Medicine

## 2022-12-20 ENCOUNTER — Other Ambulatory Visit: Payer: Self-pay

## 2022-12-20 ENCOUNTER — Encounter: Payer: Self-pay | Admitting: Family Medicine

## 2022-12-20 DIAGNOSIS — Z1231 Encounter for screening mammogram for malignant neoplasm of breast: Secondary | ICD-10-CM | POA: Insufficient documentation

## 2022-12-20 DIAGNOSIS — Z780 Asymptomatic menopausal state: Secondary | ICD-10-CM | POA: Insufficient documentation

## 2022-12-20 MED ORDER — AMLODIPINE BESYLATE 5 MG PO TABS
5.0000 mg | ORAL_TABLET | Freq: Every morning | ORAL | 3 refills | Status: DC
  Filled 2022-12-20: qty 90, fill #0
  Filled 2023-03-06: qty 90, 90d supply, fill #0
  Filled 2023-06-19: qty 90, 90d supply, fill #1
  Filled 2023-09-27: qty 90, 90d supply, fill #2

## 2022-12-20 MED ORDER — LEVOTHYROXINE SODIUM 175 MCG PO TABS
175.0000 ug | ORAL_TABLET | Freq: Every day | ORAL | 3 refills | Status: DC
  Filled 2022-12-20: qty 90, fill #0
  Filled 2023-02-15 – 2023-03-02 (×2): qty 90, 90d supply, fill #0
  Filled 2023-06-15: qty 90, 90d supply, fill #1
  Filled 2023-09-23: qty 90, 90d supply, fill #2

## 2022-12-20 MED ORDER — LOSARTAN POTASSIUM 100 MG PO TABS
ORAL_TABLET | Freq: Every day | ORAL | 3 refills | Status: DC
  Filled 2022-12-20: qty 90, 90d supply, fill #0
  Filled 2023-05-23: qty 90, 90d supply, fill #1
  Filled 2023-08-25: qty 90, 90d supply, fill #2

## 2022-12-20 NOTE — Assessment & Plan Note (Signed)
Chronic.  Stable.  Asymptomatic.  -Continue Amlodipine 5 mg daily -Continue Losartan 100 mg daily -Recommend to take BP meds at night to optimize BP  -Monitor BP at home, recordings and will review at next visit

## 2022-12-20 NOTE — Assessment & Plan Note (Addendum)
Healthy 67 year old female Pap up-to-date.  ASCUS 2022.  Follows with OB/GYN. Referral for mammogram  DEXA completed Colonoscopy up-to-date.  Recommend follow-up in 7 years.  Due 2029 Shingles completed Pneumonia declined

## 2022-12-20 NOTE — Assessment & Plan Note (Signed)
Chronic.  Stable. Continue levothyroxine 125 mcg daily Labs today

## 2022-12-31 ENCOUNTER — Encounter: Payer: No Typology Code available for payment source | Admitting: Internal Medicine

## 2023-02-15 ENCOUNTER — Other Ambulatory Visit: Payer: Self-pay

## 2023-03-01 ENCOUNTER — Other Ambulatory Visit: Payer: Self-pay

## 2023-03-02 ENCOUNTER — Other Ambulatory Visit: Payer: Self-pay

## 2023-03-06 ENCOUNTER — Other Ambulatory Visit: Payer: Self-pay

## 2023-03-31 ENCOUNTER — Other Ambulatory Visit: Payer: Self-pay

## 2023-03-31 ENCOUNTER — Ambulatory Visit (INDEPENDENT_AMBULATORY_CARE_PROVIDER_SITE_OTHER): Payer: 59 | Admitting: Family Medicine

## 2023-03-31 VITALS — BP 120/72 | HR 73 | Temp 97.8°F | Ht 61.0 in | Wt 226.8 lb

## 2023-03-31 DIAGNOSIS — E559 Vitamin D deficiency, unspecified: Secondary | ICD-10-CM

## 2023-03-31 DIAGNOSIS — E039 Hypothyroidism, unspecified: Secondary | ICD-10-CM | POA: Diagnosis not present

## 2023-03-31 DIAGNOSIS — Z136 Encounter for screening for cardiovascular disorders: Secondary | ICD-10-CM

## 2023-03-31 DIAGNOSIS — E782 Mixed hyperlipidemia: Secondary | ICD-10-CM | POA: Diagnosis not present

## 2023-03-31 DIAGNOSIS — R7989 Other specified abnormal findings of blood chemistry: Secondary | ICD-10-CM

## 2023-03-31 DIAGNOSIS — I10 Essential (primary) hypertension: Secondary | ICD-10-CM

## 2023-03-31 DIAGNOSIS — Z6841 Body Mass Index (BMI) 40.0 and over, adult: Secondary | ICD-10-CM | POA: Diagnosis not present

## 2023-03-31 MED ORDER — VITAMIN D (ERGOCALCIFEROL) 1.25 MG (50000 UNIT) PO CAPS
50000.0000 [IU] | ORAL_CAPSULE | ORAL | 0 refills | Status: DC
  Filled 2023-03-31: qty 12, 84d supply, fill #0

## 2023-03-31 MED ORDER — VITAMIN B-12 1000 MCG SL SUBL
1000.0000 ug | SUBLINGUAL_TABLET | Freq: Every day | SUBLINGUAL | 3 refills | Status: AC
  Filled 2023-03-31: qty 90, 90d supply, fill #0

## 2023-03-31 NOTE — Patient Instructions (Addendum)
It was a pleasure meeting you today. Thank you for allowing me to take part in your health care.  Our goals for today as we discussed include:  Start Vitamin D 1.25 mg weekly for 3 months then Vitamin 2000 IU daily Start Vitamin B 12 1000 mcg daily   Calcium score CT referral sent. They will call you with an appointment  Last PAP in 2022. Follow up with OBGYN referral.    If you have any questions or concerns, please do not hesitate to call the office at [phone].  I look forward to our next visit and until then take care and stay safe.  Regards,   Dana Allan, MD   [facility]

## 2023-03-31 NOTE — Progress Notes (Unsigned)
SUBJECTIVE:   Chief Complaint  Patient presents with   Medical Management of Chronic Issues   HPI Patient presents to clinic to discuss chronic disease management.  No acute concerns today.  Hypertension Asymptomatic.  Compliant with current medication.  Takes Norvasc 5 mg, Cozaar 100 mg and  HydroDIURIL 25 mg daily.  Tolerating medications well.  Denies any headaches, visual changes, chest pain, shortness of breath or lower extremity edema.  Hypothyroid Asymptomatic.  Reviewed recent TSH within normal limits.  Takes levothyroxine 175 mcg's daily and tolerating well.  Hyperlipidemia Not currently on statin, risk factors include hypertension, obesity.  LDL not at goal less than 161.  Prefers not to start statin therapy.  Discussed calcium score for further evaluation and risk assessment.  Prefers to proceed with cardiac CT   PERTINENT PMH / PSH: Obesity class III Hypertension Hypothyroid Hyperlipidemia   OBJECTIVE:  BP 120/72   Pulse 73   Temp 97.8 F (36.6 C) (Oral)   Ht 5\' 1"  (1.549 m)   Wt 226 lb 12.8 oz (102.9 kg)   SpO2 98%   BMI 42.85 kg/m    Physical Exam Vitals reviewed.  Constitutional:      General: She is not in acute distress.    Appearance: Normal appearance. She is normal weight. She is not ill-appearing, toxic-appearing or diaphoretic.  Eyes:     General:        Right eye: No discharge.        Left eye: No discharge.     Conjunctiva/sclera: Conjunctivae normal.  Cardiovascular:     Rate and Rhythm: Normal rate and regular rhythm.     Heart sounds: Normal heart sounds.  Pulmonary:     Effort: Pulmonary effort is normal.     Breath sounds: Normal breath sounds.  Abdominal:     General: Bowel sounds are normal.  Musculoskeletal:        General: Normal range of motion.  Skin:    General: Skin is warm and dry.  Neurological:     General: No focal deficit present.     Mental Status: She is alert and oriented to person, place, and time. Mental  status is at baseline.  Psychiatric:        Mood and Affect: Mood normal.        Behavior: Behavior normal.        Thought Content: Thought content normal.        Judgment: Judgment normal.     ASSESSMENT/PLAN:  Screening for ischemic heart disease -     CT CARDIAC SCORING (SELF PAY ONLY); Future  Vitamin D deficiency Assessment & Plan: Vitamin D level low normal Refill vitamin D 1.25 mg weekly   Orders: -     Vitamin D (Ergocalciferol); Take 1 capsule (50,000 Units total) by mouth every 7 (seven) days.  Dispense: 12 capsule; Refill: 1  Low vitamin B12 level -     Vitamin B-12; Place 1 tablet (1,000 mcg total) under the tongue daily.  Dispense: 90 tablet; Refill: 3  Morbid obesity with BMI of 40.0-44.9, adult [facility]) Assessment & Plan: -Lifestyle management and increase exercise -Set realistic goals to achieve weekly -Increase water intake  -Could consider bariatric surgery for weight loss     Primary hypertension Assessment & Plan: Chronic.  Stable.  Asymptomatic.  -Continue Amlodipine 5 mg daily -Continue Losartan 100 mg daily -Continue hydrochlorothiazide 25 mg daily -Monitor BP at home, recordings and will review at next visit  Mixed hyperlipidemia Assessment & Plan: Chronic.  LDL not at goal Not currently on statin therapy Agreeable to proceed with calcium score Encouraged healthy lifestyle and increased activity  Orders: -     Rosuvastatin Calcium; Take 1 tablet (5 mg total) by mouth daily.  Dispense: 90 tablet; Refill: 3  Hypothyroidism, unspecified type Assessment & Plan: Chronic.  Asymptomatic.  Recent TSH within normal limits Continue levothyroxine 125 mcg daily Check TSH annually   HCM Pneumonia up-to-date Mammogram due.  Referral previously sent.  Patient to schedule appointment. Last Pap 12/22, ASCUS, HPV negative.  Refer to OB/GYN for colposcopy.  Patient to follow-up with OB/GYN.  Will obtain records Colonoscopy up-to-date   PDMP  reviewed  Return in 6 months (on 09/30/2023), or if symptoms worsen or fail to improve.  Dana Allan, MD

## 2023-04-08 ENCOUNTER — Ambulatory Visit
Admission: RE | Admit: 2023-04-08 | Discharge: 2023-04-08 | Disposition: A | Discharge status: Home / Self Care | Payer: 59 | Source: Ambulatory Visit | Attending: Family Medicine | Admitting: Family Medicine

## 2023-04-08 DIAGNOSIS — Z136 Encounter for screening for cardiovascular disorders: Secondary | ICD-10-CM | POA: Insufficient documentation

## 2023-04-09 ENCOUNTER — Encounter: Payer: Self-pay | Admitting: Family Medicine

## 2023-04-09 NOTE — Assessment & Plan Note (Signed)
-  Lifestyle management and increase exercise -Set realistic goals to achieve weekly -Increase water intake  -Could consider bariatric surgery for weight loss

## 2023-04-09 NOTE — Assessment & Plan Note (Signed)
Chronic.  Stable.  Asymptomatic.  -Continue Amlodipine 5 mg daily -Continue Losartan 100 mg daily -Continue hydrochlorothiazide 25 mg daily -Monitor BP at home, recordings and will review at next visit

## 2023-04-10 ENCOUNTER — Telehealth: Payer: Self-pay | Admitting: Family Medicine

## 2023-04-10 ENCOUNTER — Other Ambulatory Visit: Payer: Self-pay

## 2023-04-10 ENCOUNTER — Encounter: Payer: Self-pay | Admitting: Family Medicine

## 2023-04-10 DIAGNOSIS — R7989 Other specified abnormal findings of blood chemistry: Secondary | ICD-10-CM | POA: Insufficient documentation

## 2023-04-10 DIAGNOSIS — Z136 Encounter for screening for cardiovascular disorders: Secondary | ICD-10-CM | POA: Insufficient documentation

## 2023-04-10 HISTORY — DX: Other specified abnormal findings of blood chemistry: R79.89

## 2023-04-10 MED ORDER — ROSUVASTATIN CALCIUM 5 MG PO TABS
5.0000 mg | ORAL_TABLET | Freq: Every day | ORAL | 3 refills | Status: DC
  Filled 2023-04-10: qty 90, 90d supply, fill #0
  Filled 2023-06-19 – 2023-06-23 (×2): qty 90, 90d supply, fill #1
  Filled 2023-10-20: qty 90, 90d supply, fill #2

## 2023-04-10 MED ORDER — VITAMIN D (ERGOCALCIFEROL) 1.25 MG (50000 UNIT) PO CAPS
50000.0000 [IU] | ORAL_CAPSULE | ORAL | 1 refills | Status: DC
  Filled 2023-04-10: qty 12, 84d supply, fill #0

## 2023-04-10 NOTE — Assessment & Plan Note (Signed)
Chronic.  LDL not at goal Not currently on statin therapy Agreeable to proceed with calcium score Encouraged healthy lifestyle and increased activity

## 2023-04-10 NOTE — Assessment & Plan Note (Signed)
Chronic.  Asymptomatic.  Recent TSH within normal limits Continue levothyroxine 125 mcg daily Check TSH annually

## 2023-04-10 NOTE — Assessment & Plan Note (Signed)
Vitamin D level low normal Refill vitamin D 1.25 mg weekly

## 2023-04-11 ENCOUNTER — Other Ambulatory Visit: Payer: Self-pay

## 2023-04-11 NOTE — Telephone Encounter (Signed)
Records requested

## 2023-04-14 ENCOUNTER — Encounter: Payer: Self-pay | Admitting: Family Medicine

## 2023-04-14 DIAGNOSIS — I700 Atherosclerosis of aorta: Secondary | ICD-10-CM | POA: Insufficient documentation

## 2023-04-15 ENCOUNTER — Ambulatory Visit
Admission: RE | Admit: 2023-04-15 | Discharge: 2023-04-15 | Disposition: A | Discharge status: Home / Self Care | Payer: 59 | Source: Ambulatory Visit | Attending: Family Medicine | Admitting: Family Medicine

## 2023-04-15 DIAGNOSIS — Z1231 Encounter for screening mammogram for malignant neoplasm of breast: Secondary | ICD-10-CM | POA: Insufficient documentation

## 2023-05-23 ENCOUNTER — Other Ambulatory Visit: Payer: Self-pay

## 2023-06-15 ENCOUNTER — Other Ambulatory Visit: Payer: Self-pay

## 2023-06-19 ENCOUNTER — Other Ambulatory Visit: Payer: Self-pay

## 2023-06-20 ENCOUNTER — Other Ambulatory Visit: Payer: Self-pay

## 2023-06-23 ENCOUNTER — Other Ambulatory Visit: Payer: Self-pay

## 2023-08-16 ENCOUNTER — Other Ambulatory Visit: Payer: Self-pay

## 2023-08-25 ENCOUNTER — Other Ambulatory Visit: Payer: Self-pay

## 2023-08-26 ENCOUNTER — Other Ambulatory Visit: Payer: Self-pay

## 2023-08-30 ENCOUNTER — Other Ambulatory Visit: Payer: Self-pay | Admitting: Family Medicine

## 2023-08-30 ENCOUNTER — Other Ambulatory Visit: Payer: Self-pay

## 2023-08-30 DIAGNOSIS — I10 Essential (primary) hypertension: Secondary | ICD-10-CM

## 2023-08-30 MED FILL — Hydrochlorothiazide Tab 25 MG: ORAL | 90 days supply | Qty: 90 | Fill #0 | Status: AC

## 2023-09-01 ENCOUNTER — Other Ambulatory Visit: Payer: Self-pay

## 2023-10-04 ENCOUNTER — Other Ambulatory Visit: Payer: Self-pay

## 2023-10-04 ENCOUNTER — Ambulatory Visit (INDEPENDENT_AMBULATORY_CARE_PROVIDER_SITE_OTHER): Payer: 59 | Admitting: Family Medicine

## 2023-10-04 ENCOUNTER — Encounter: Payer: Self-pay | Admitting: Family Medicine

## 2023-10-04 VITALS — BP 118/66 | HR 70 | Temp 98.2°F | Resp 16 | Ht 61.0 in | Wt 210.4 lb

## 2023-10-04 DIAGNOSIS — I10 Essential (primary) hypertension: Secondary | ICD-10-CM

## 2023-10-04 DIAGNOSIS — Z1231 Encounter for screening mammogram for malignant neoplasm of breast: Secondary | ICD-10-CM

## 2023-10-04 DIAGNOSIS — J309 Allergic rhinitis, unspecified: Secondary | ICD-10-CM

## 2023-10-04 DIAGNOSIS — E785 Hyperlipidemia, unspecified: Secondary | ICD-10-CM

## 2023-10-04 DIAGNOSIS — Z1211 Encounter for screening for malignant neoplasm of colon: Secondary | ICD-10-CM

## 2023-10-04 DIAGNOSIS — J4520 Mild intermittent asthma, uncomplicated: Secondary | ICD-10-CM

## 2023-10-04 DIAGNOSIS — E559 Vitamin D deficiency, unspecified: Secondary | ICD-10-CM

## 2023-10-04 DIAGNOSIS — R7989 Other specified abnormal findings of blood chemistry: Secondary | ICD-10-CM

## 2023-10-04 DIAGNOSIS — Z6841 Body Mass Index (BMI) 40.0 and over, adult: Secondary | ICD-10-CM | POA: Diagnosis not present

## 2023-10-04 MED ORDER — AIRSUPRA 90-80 MCG/ACT IN AERO
1.0000 | INHALATION_SPRAY | Freq: Four times a day (QID) | RESPIRATORY_TRACT | 3 refills | Status: AC | PRN
  Filled 2023-10-04 – 2023-10-20 (×2): qty 10.7, 30d supply, fill #0
  Filled 2024-03-15: qty 10.7, 30d supply, fill #1
  Filled 2024-08-28: qty 10.7, 30d supply, fill #2

## 2023-10-04 MED ORDER — LOSARTAN POTASSIUM 100 MG PO TABS
50.0000 mg | ORAL_TABLET | Freq: Every day | ORAL | Status: DC
Start: 2023-10-04 — End: 2023-11-12

## 2023-10-04 MED ORDER — IPRATROPIUM BROMIDE 0.06 % NA SOLN
2.0000 | Freq: Three times a day (TID) | NASAL | 11 refills | Status: AC | PRN
  Filled 2023-10-04: qty 15, 14d supply, fill #0
  Filled 2024-03-15: qty 15, 14d supply, fill #1

## 2023-10-04 NOTE — Progress Notes (Signed)
SUBJECTIVE:   Chief Complaint  Patient presents with   Medical Management of Chronic Issues   HPI Follow up for chronic care management  Discussed the use of AI scribe software for clinical note transcription with the patient, who gave verbal consent to proceed.  History of Present Illness The patient, with a history of hypertension and obesity, presents for a follow-up visit after significant weight loss. She reports a weight loss of approximately 20 pounds, but expresses frustration at a weight plateau at 210 pounds. The patient has made dietary changes, including eliminating red meat and sweets, and increasing water and unsweetened apple juice intake. She also reports daily treadmill use for 30 minutes.  Despite these efforts, the patient feels stuck at her current weight and is seeking advice for further weight loss. She expresses a desire to lose an additional 10 pounds. The patient also reports a persistent cough, which she describes as a constant tickle in her throat. This cough is not associated with any other respiratory symptoms such as wheezing or shortness of breath.  The patient also reports a history of low vitamin B12 and vitamin D levels, for which she has been taking supplements. She has been adherent to these supplements and reports taking them regularly. The patient also has a history of hypertension, for which she is on losartan and hydrochlorothiazide. She reports no symptoms of dizziness or chest pain.  The patient has been on Symbicort for a cough in the past, but stopped it as she did not believe she had asthma. She expresses concern about the cough being related to postnasal drip. She also reports using an albuterol inhaler as needed for the cough.  In summary, the patient presents with concerns about a weight loss plateau, a persistent cough, and questions about her current medications and supplements. She is actively engaged in her health and is seeking advice for  further weight loss and management of her cough.    PERTINENT PMH / PSH: As above  OBJECTIVE:  BP 118/66   Pulse 70   Temp 98.2 F (36.8 C)   Resp 16   Ht 5\' 1"  (1.549 m)   Wt 210 lb 6 oz (95.4 kg)   SpO2 98%   BMI 39.75 kg/m    Physical Exam Vitals reviewed.  Constitutional:      General: She is not in acute distress.    Appearance: Normal appearance. She is normal weight. She is not ill-appearing, toxic-appearing or diaphoretic.  Eyes:     General:        Right eye: No discharge.        Left eye: No discharge.     Conjunctiva/sclera: Conjunctivae normal.  Cardiovascular:     Rate and Rhythm: Normal rate and regular rhythm.     Heart sounds: Normal heart sounds.  Pulmonary:     Effort: Pulmonary effort is normal.     Breath sounds: Normal breath sounds.  Abdominal:     General: Bowel sounds are normal.  Musculoskeletal:        General: Normal range of motion.  Skin:    General: Skin is warm and dry.  Neurological:     General: No focal deficit present.     Mental Status: She is alert and oriented to person, place, and time. Mental status is at baseline.  Psychiatric:        Mood and Affect: Mood normal.        Behavior: Behavior normal.  Thought Content: Thought content normal.        Judgment: Judgment normal.        03/31/2023    3:14 PM 12/08/2022    9:28 AM 09/23/2022   10:20 AM 12/02/2021    8:33 AM 11/28/2020    8:13 AM  Depression screen PHQ 2/9  Decreased Interest 0 0 0 0 0  Down, Depressed, Hopeless 0 0 0 0 0  PHQ - 2 Score 0 0 0 0 0  Altered sleeping 0 0   0  Tired, decreased energy 0 0   0  Change in appetite 0 0   0  Feeling bad or failure about yourself  0 0   0  Trouble concentrating 0 0   0  Moving slowly or fidgety/restless 0 0   0  Suicidal thoughts 0 0   0  PHQ-9 Score 0 0   0  Difficult doing work/chores  Not difficult at all   Not difficult at all      03/31/2023    3:15 PM 12/08/2022    9:28 AM 11/28/2020    8:13 AM   GAD 7 : Generalized Anxiety Score  Nervous, Anxious, on Edge 0 0 0  Control/stop worrying 0 0 0  Worry too much - different things 0 0 0  Trouble relaxing 0 0 0  Restless 0 0 0  Easily annoyed or irritable 0 0 0  Afraid - awful might happen 0 0 0  Total GAD 7 Score 0 0 0  Anxiety Difficulty  Not difficult at all Not difficult at all    ASSESSMENT/PLAN:  Encounter for screening mammogram for malignant neoplasm of breast -     3D Screening Mammogram, Left and Right; Future  Allergic rhinitis, unspecified seasonality, unspecified trigger -     Ipratropium Bromide; Place 2 sprays into both nostrils 3 (three) times daily as needed for rhinitis.  Dispense: 15 mL; Refill: 11  Essential hypertension Assessment & Plan: Blood pressure well controlled, possibly over controlled with current regimen of Losartan 100mg  and Hydrochlorothiazide 25mg . No symptoms of hypotension reported. -Reduce Losartan to 50mg  daily and monitor blood pressure for a week. -If blood pressure remains controlled, reduce Hydrochlorothiazide to 12.5mg  daily and continue to monitor blood pressure. -If blood pressure increases, return to previous dosages and notify physician.  Orders: -     Losartan Potassium; Take 0.5 tablets (50 mg total) by mouth daily.  Mild intermittent asthma without complication Assessment & Plan: Chronic Cough Possible postnasal drip or asthma. Currently using Symbicort and Albuterol as needed. -Discontinue Albuterol. -Start Airsupra as needed for cough. -Continue Symbicort daily and monitor for improvement in cough.  Orders: -     Airsupra; Inhale 1 puff into the lungs 4 (four) times daily as needed.  Dispense: 10.7 g; Refill: 3  Morbid obesity with BMI of 40.0-44.9, adult (HCC) Assessment & Plan: Significant weight loss achieved, but currently plateaued at 210 lbs. Discussed the importance of nutrition and exercise. Consideration of Wegovy, but patient needs to learn healthy eating  habits first. -Encouraged to continue current exercise regimen and consider weight training. -Advised to research nutrition and consider Mediterranean diet. -Consider Reginal Lutes if patient demonstrates understanding of healthy eating habits.   Low vitamin B12 level Assessment & Plan: Taking Vitamin B12 due to previous deficiency. -Continue Vitamin B12 1000mg  daily.   Vitamin D deficiency Assessment & Plan: Completed course of high dose Vitamin D and currently on maintenance dose.  -Continue over the counter  Vitamin D 1000mg  daily.   Hyperlipidemia, unspecified hyperlipidemia type Assessment & Plan: Last cholesterol check 6 months ago.  -Plan to check cholesterol at next annual physical. -Continue Crestor 5 mg daily     PDMP reviewed  Return in about 4 weeks (around 11/01/2023) for PCP.  Dana Allan, MD

## 2023-10-04 NOTE — Patient Instructions (Addendum)
It was a pleasure meeting you today. Thank you for allowing me to take part in your health care.  Our goals for today as we discussed include:  Decrease losartan to 50 mg daily Continue to monitor blood pressure.  If remains <120/80 can decrease hydrochlorothiazide to 12.5 mg If blood pressure increases >140/90 resume losartan 100 mg daily and continue hydrochlorothiazide 25 mg daily  Continue Amlodipine 5 mg daily  Stop Albuterol Start Airsupra 1-2 puffs every 4-6 hours. Max 12 puffs in 24 hours.  Can be used as rescue therapy  If no improvement in cough can resume Symbicort  Congratulations on recent weight loss.  Continue to set small goals and change up diet and exercise.  Follow up in 4 weeks  This is a list of the screening recommended for you and due dates:  Health Maintenance  Topic Date Due   Pneumonia Vaccine (2 of 2 - PCV) 11/24/2023*   Mammogram  04/14/2025   Colon Cancer Screening  11/27/2028   DTaP/Tdap/Td vaccine (3 - Td or Tdap) 01/10/2030   Flu Shot  Completed   DEXA scan (bone density measurement)  Completed   Hepatitis C Screening  Completed   Zoster (Shingles) Vaccine  Completed   HPV Vaccine  Aged Out   COVID-19 Vaccine  Discontinued  *Topic was postponed. The date shown is not the original due date.      If you have any questions or concerns, please do not hesitate to call the office at [phone].  I look forward to our next visit and until then take care and stay safe.  Regards,   Dana Allan, MD   [facility]

## 2023-10-07 ENCOUNTER — Encounter: Payer: Self-pay | Admitting: Family Medicine

## 2023-10-07 DIAGNOSIS — Z1231 Encounter for screening mammogram for malignant neoplasm of breast: Secondary | ICD-10-CM | POA: Insufficient documentation

## 2023-10-07 DIAGNOSIS — Z131 Encounter for screening for diabetes mellitus: Secondary | ICD-10-CM | POA: Insufficient documentation

## 2023-10-07 NOTE — Assessment & Plan Note (Signed)
Taking Vitamin B12 due to previous deficiency. -Continue Vitamin B12 1000mg  daily.

## 2023-10-07 NOTE — Assessment & Plan Note (Signed)
Chronic Cough Possible postnasal drip or asthma. Currently using Symbicort and Albuterol as needed. -Discontinue Albuterol. -Start Airsupra as needed for cough. -Continue Symbicort daily and monitor for improvement in cough.

## 2023-10-07 NOTE — Assessment & Plan Note (Signed)
Completed course of high dose Vitamin D and currently on maintenance dose.  -Continue over the counter Vitamin D 1000mg  daily.

## 2023-10-07 NOTE — Assessment & Plan Note (Signed)
Blood pressure well controlled, possibly over controlled with current regimen of Losartan 100mg  and Hydrochlorothiazide 25mg . No symptoms of hypotension reported. -Reduce Losartan to 50mg  daily and monitor blood pressure for a week. -If blood pressure remains controlled, reduce Hydrochlorothiazide to 12.5mg  daily and continue to monitor blood pressure. -If blood pressure increases, return to previous dosages and notify physician.

## 2023-10-07 NOTE — Assessment & Plan Note (Signed)
Last cholesterol check 6 months ago.  -Plan to check cholesterol at next annual physical. -Continue Crestor 5 mg daily

## 2023-10-07 NOTE — Assessment & Plan Note (Signed)
Significant weight loss achieved, but currently plateaued at 210 lbs. Discussed the importance of nutrition and exercise. Consideration of Wegovy, but patient needs to learn healthy eating habits first. -Encouraged to continue current exercise regimen and consider weight training. -Advised to research nutrition and consider Mediterranean diet. -Consider Reginal Lutes if patient demonstrates understanding of healthy eating habits.

## 2023-10-18 ENCOUNTER — Encounter: Payer: Self-pay | Admitting: Family Medicine

## 2023-10-19 ENCOUNTER — Other Ambulatory Visit: Payer: Self-pay

## 2023-10-20 ENCOUNTER — Other Ambulatory Visit: Payer: Self-pay

## 2023-11-01 ENCOUNTER — Ambulatory Visit: Payer: 59 | Admitting: Family Medicine

## 2023-11-04 ENCOUNTER — Ambulatory Visit (INDEPENDENT_AMBULATORY_CARE_PROVIDER_SITE_OTHER): Payer: 59 | Admitting: Family Medicine

## 2023-11-04 ENCOUNTER — Encounter: Payer: Self-pay | Admitting: Family Medicine

## 2023-11-04 VITALS — BP 120/72 | HR 62 | Temp 98.0°F | Resp 16 | Ht 61.0 in | Wt 209.4 lb

## 2023-11-04 DIAGNOSIS — Z6841 Body Mass Index (BMI) 40.0 and over, adult: Secondary | ICD-10-CM

## 2023-11-04 DIAGNOSIS — E782 Mixed hyperlipidemia: Secondary | ICD-10-CM

## 2023-11-04 DIAGNOSIS — E039 Hypothyroidism, unspecified: Secondary | ICD-10-CM

## 2023-11-04 DIAGNOSIS — R7989 Other specified abnormal findings of blood chemistry: Secondary | ICD-10-CM

## 2023-11-04 DIAGNOSIS — R7309 Other abnormal glucose: Secondary | ICD-10-CM

## 2023-11-04 DIAGNOSIS — I700 Atherosclerosis of aorta: Secondary | ICD-10-CM

## 2023-11-04 DIAGNOSIS — E559 Vitamin D deficiency, unspecified: Secondary | ICD-10-CM | POA: Diagnosis not present

## 2023-11-04 DIAGNOSIS — E785 Hyperlipidemia, unspecified: Secondary | ICD-10-CM | POA: Diagnosis not present

## 2023-11-04 DIAGNOSIS — I10 Essential (primary) hypertension: Secondary | ICD-10-CM | POA: Diagnosis not present

## 2023-11-04 DIAGNOSIS — J4520 Mild intermittent asthma, uncomplicated: Secondary | ICD-10-CM

## 2023-11-04 NOTE — Progress Notes (Signed)
SUBJECTIVE:   Chief Complaint  Patient presents with   Medication Dose Change    4 week follow up    HPI Presents for follow up chronic disease management  Discussed the use of AI scribe software for clinical note transcription with the patient, who gave verbal consent to proceed.  History of Present Illness The patient, with a history of hypertension and weight management issues, presents for a follow-up visit. She reports a slow but steady weight loss, with a recent decrease of one pound, and expresses a desire to lose at least nine more pounds. She acknowledges the slow pace of weight loss but remains committed to her goal.  The patient's blood pressure has been well-controlled on a regimen of Losartan 50mg  and Hydrochlorothiazide 25mg . Losartan dose was decreased from 100 mg at last visit. She reports home readings of 118/70 to 120/70, with no symptoms of dizziness or shortness of breath. She expresses satisfaction with her current blood pressure control and a desire to continue on the current regimen.  The patient also reports recent exposure to dust due to kitchen renovations, which has led to increased use of her Albuterol inhaler, Airsupra, up to twice a day. She understands this is a rescue therapy and uses it as needed. She denies any other new symptoms or health concerns. The patient's goal is to continue her weight loss journey and potentially reduce her medication load.    PERTINENT PMH / PSH: As above  OBJECTIVE:  BP 120/72   Pulse 62   Temp 98 F (36.7 C)   Resp 16   Ht 5\' 1"  (1.549 m)   Wt 209 lb 6 oz (95 kg)   SpO2 98%   BMI 39.56 kg/m    Physical Exam Vitals reviewed.  Constitutional:      General: She is not in acute distress.    Appearance: Normal appearance. She is obese. She is not ill-appearing, toxic-appearing or diaphoretic.  Eyes:     General:        Right eye: No discharge.        Left eye: No discharge.     Conjunctiva/sclera: Conjunctivae  normal.  Cardiovascular:     Rate and Rhythm: Normal rate and regular rhythm.     Heart sounds: Normal heart sounds.  Pulmonary:     Effort: Pulmonary effort is normal.     Breath sounds: Normal breath sounds.  Abdominal:     General: Bowel sounds are normal.  Skin:    General: Skin is warm and dry.  Neurological:     Mental Status: She is alert and oriented to person, place, and time. Mental status is at baseline.  Psychiatric:        Mood and Affect: Mood normal.        Behavior: Behavior normal.        Thought Content: Thought content normal.        Judgment: Judgment normal.        11/04/2023    9:11 AM 03/31/2023    3:14 PM 12/08/2022    9:28 AM 09/23/2022   10:20 AM 12/02/2021    8:33 AM  Depression screen PHQ 2/9  Decreased Interest 0 0 0 0 0  Down, Depressed, Hopeless 0 0 0 0 0  PHQ - 2 Score 0 0 0 0 0  Altered sleeping 0 0 0    Tired, decreased energy 0 0 0    Change in appetite 0 0 0  Feeling bad or failure about yourself  0 0 0    Trouble concentrating 0 0 0    Moving slowly or fidgety/restless 0 0 0    Suicidal thoughts 0 0 0    PHQ-9 Score 0 0 0    Difficult doing work/chores Not difficult at all  Not difficult at all        11/04/2023    9:11 AM 03/31/2023    3:15 PM 12/08/2022    9:28 AM 11/28/2020    8:13 AM  GAD 7 : Generalized Anxiety Score  Nervous, Anxious, on Edge 0 0 0 0  Control/stop worrying 0 0 0 0  Worry too much - different things 0 0 0 0  Trouble relaxing 0 0 0 0  Restless 0 0 0 0  Easily annoyed or irritable 0 0 0 0  Afraid - awful might happen 0 0 0 0  Total GAD 7 Score 0 0 0 0  Anxiety Difficulty Not difficult at all  Not difficult at all Not difficult at all    ASSESSMENT/PLAN:  Essential hypertension Assessment & Plan: Well controlled on Losartan 50mg  and Hydrochlorothiazide 25mg . No symptoms of hypotension reported. Home blood pressure readings have been around 118/70. -Continue current medications. -Monitor blood  pressure periodically at home. -If symptoms of hypotension develop or blood pressure drops further, consider reducing medication.  Orders: -     Comprehensive metabolic panel; Future -     amLODIPine Besylate; Take 1 tablet (5 mg total) by mouth every morning. -     hydroCHLOROthiazide; Take 1 tablet (25 mg total) by mouth daily. -     Losartan Potassium; Take 0.5 tablets (50 mg total) by mouth daily.  Morbid obesity with BMI of 40.0-44.9, adult [facility]) Assessment & Plan: Slow but steady weight loss. Patient's goal is to lose 9 more pounds.  Total 22 lbs lost since starting weight loss journey 04/2022. -Encouraged patient to continue current efforts -Recommend switching up activity    Mild intermittent asthma without complication Assessment & Plan: Increased use of rescue inhaler (Airsupra) due to dust exposure from home renovations. -Continue using Airsupra as needed, up to 12 puffs in a 24-hour period. -Reminded patient that Paulene Floor is a rescue therapy, not a daily medication.   Aortic atherosclerosis (HCC) Assessment & Plan: Continue Crestor 5 mg daily Recheck Lipids at next visit  Orders: -     Lipid panel; Future  Hyperlipidemia, unspecified hyperlipidemia type Assessment & Plan: Refill Crestor 5 mg daily  Orders: -     Lipid panel; Future  Low vitamin B12 level -     Vitamin B12; Future  Vitamin D deficiency -     VITAMIN D 25 Hydroxy (Vit-D Deficiency, Fractures); Future  Abnormal glucose -     Hemoglobin A1c; Future  Hypothyroidism, unspecified type -     TSH; Future -     Levothyroxine Sodium; Take 1 tablet (175 mcg total) by mouth daily before breakfast.  Mixed hyperlipidemia Assessment & Plan: Refill Crestor 5 mg daily  Orders: -     Rosuvastatin Calcium; Take 1 tablet (5 mg total) by mouth daily.  Dispense: 90 tablet; Refill: 3   PDMP reviewed  Return in about 6 months (around 05/03/2024) for PCP, blood pressure.  Dana Allan, MD

## 2023-11-04 NOTE — Patient Instructions (Addendum)
It was a pleasure meeting you today. Thank you for allowing me to take part in your health care.  Our goals for today as we discussed include:  Continue current medications at same dose Blood pressure looks great  Continue weight loss journey and congratulation on your success thus far.  22lbs down.  This is great.   This is a list of the screening recommended for you and due dates:  Health Maintenance  Topic Date Due   Pneumonia Vaccine (2 of 2 - PCV) 11/24/2023*   Mammogram  04/14/2025   Colon Cancer Screening  11/27/2028   DTaP/Tdap/Td vaccine (3 - Td or Tdap) 01/10/2030   Flu Shot  Completed   DEXA scan (bone density measurement)  Completed   Hepatitis C Screening  Completed   Zoster (Shingles) Vaccine  Completed   HPV Vaccine  Aged Out   COVID-19 Vaccine  Discontinued  *Topic was postponed. The date shown is not the original due date.    Follow up in 6 months  If you have any questions or concerns, please do not hesitate to call the office at [phone].  I look forward to our next visit and until then take care and stay safe.  Regards,   Dana Allan, MD   [facility]

## 2023-11-12 ENCOUNTER — Encounter: Payer: Self-pay | Admitting: Family Medicine

## 2023-11-12 ENCOUNTER — Other Ambulatory Visit: Payer: Self-pay

## 2023-11-12 DIAGNOSIS — R7309 Other abnormal glucose: Secondary | ICD-10-CM | POA: Insufficient documentation

## 2023-11-12 MED ORDER — LOSARTAN POTASSIUM 100 MG PO TABS: 50.0000 mg | ORAL_TABLET | Freq: Every day | ORAL | Status: DC

## 2023-11-12 MED ORDER — ROSUVASTATIN CALCIUM 5 MG PO TABS
5.0000 mg | ORAL_TABLET | Freq: Every day | ORAL | 3 refills | Status: DC
Start: 2023-11-12 — End: 2024-05-04
  Filled 2023-11-12 – 2024-01-22 (×2): qty 90, 90d supply, fill #0

## 2023-11-12 MED ORDER — HYDROCHLOROTHIAZIDE 25 MG PO TABS: 25.0000 mg | ORAL_TABLET | Freq: Every day | ORAL | Status: DC

## 2023-11-12 MED ORDER — LEVOTHYROXINE SODIUM 175 MCG PO TABS: 175.0000 ug | ORAL_TABLET | Freq: Every day | ORAL | Status: DC

## 2023-11-12 MED ORDER — AMLODIPINE BESYLATE 5 MG PO TABS: 5.0000 mg | ORAL_TABLET | Freq: Every morning | ORAL | Status: DC

## 2023-11-12 NOTE — Assessment & Plan Note (Signed)
Slow but steady weight loss. Patient's goal is to lose 9 more pounds.  Total 22 lbs lost since starting weight loss journey 04/2022. -Encouraged patient to continue current efforts -Recommend switching up activity

## 2023-11-12 NOTE — Assessment & Plan Note (Signed)
Increased use of rescue inhaler (Airsupra) due to dust exposure from home renovations. -Continue using Paulene Floor as needed, up to 12 puffs in a 24-hour period. -Reminded patient that Paulene Floor is a rescue therapy, not a daily medication.

## 2023-11-12 NOTE — Assessment & Plan Note (Addendum)
Refill Crestor 5 mg daily

## 2023-11-12 NOTE — Assessment & Plan Note (Signed)
Well controlled on Losartan 50mg  and Hydrochlorothiazide 25mg . No symptoms of hypotension reported. Home blood pressure readings have been around 118/70. -Continue current medications. -Monitor blood pressure periodically at home. -If symptoms of hypotension develop or blood pressure drops further, consider reducing medication.

## 2023-11-12 NOTE — Assessment & Plan Note (Signed)
Continue Crestor 5 mg daily Recheck Lipids at next visit

## 2023-11-13 ENCOUNTER — Other Ambulatory Visit: Payer: Self-pay

## 2024-01-10 ENCOUNTER — Other Ambulatory Visit: Payer: Self-pay

## 2024-01-10 ENCOUNTER — Other Ambulatory Visit: Payer: Self-pay | Admitting: Family Medicine

## 2024-01-10 DIAGNOSIS — I10 Essential (primary) hypertension: Secondary | ICD-10-CM

## 2024-01-10 DIAGNOSIS — E039 Hypothyroidism, unspecified: Secondary | ICD-10-CM

## 2024-01-10 NOTE — Telephone Encounter (Signed)
Copied from CRM [id number]. Topic: Clinical - Medication Refill >> Jan 10, 2024  2:35 PM Sonny Dandy B wrote: Most Recent Primary Care Visit:  Provider: Dana Allan  Department: [facility]  Visit Type: OFFICE VISIT  Date: 11/04/2023  Medication: levothyroxine (SYNTHROID) 175 MCG tablet ,losartan (COZAAR) 100 MG tablet, hydrochlorothiazide (HYDRODIURIL) 25 MG tablet, amLODipine (NORVASC) 5 MG tablet  Has the patient contacted their pharmacy? Yes (Agent: If no, request that the patient contact the pharmacy for the refill. If patient does not wish to contact the pharmacy document the reason why and proceed with request.) (Agent: If yes, when and what did the pharmacy advise?)  Is this the correct pharmacy for this prescription? Yes If no, delete pharmacy and type the correct one.  This is the patient's preferred pharmacy:  [facility] [address] [place] [address] Phone: [phone] Fax: [phone]   Has the prescription been filled recently? Yes  Is the patient out of the medication? Yes  Has the patient been seen for an appointment in the last year OR does the patient have an upcoming appointment? Yes  Can we respond through MyChart? Yes  Agent: Please be advised that Rx refills may take up to 3 business days. We ask that you follow-up with your pharmacy.

## 2024-01-11 ENCOUNTER — Other Ambulatory Visit: Payer: Self-pay

## 2024-01-11 MED ORDER — LEVOTHYROXINE SODIUM 175 MCG PO TABS: 175.0000 ug | ORAL_TABLET | Freq: Every day | ORAL | Status: DC

## 2024-01-11 MED ORDER — HYDROCHLOROTHIAZIDE 25 MG PO TABS
25.0000 mg | ORAL_TABLET | Freq: Every day | ORAL | 0 refills | Status: DC
  Filled 2024-01-11: qty 30, 30d supply, fill #0

## 2024-01-11 MED ORDER — AMLODIPINE BESYLATE 5 MG PO TABS: 5.0000 mg | ORAL_TABLET | Freq: Every morning | ORAL | Status: DC

## 2024-01-11 MED ORDER — LOSARTAN POTASSIUM 100 MG PO TABS
50.0000 mg | ORAL_TABLET | Freq: Every day | ORAL | 0 refills | Status: DC
  Filled 2024-01-11: qty 30, 60d supply, fill #0

## 2024-01-11 MED FILL — Levothyroxine Sodium Tab 175 MCG: ORAL | 90 days supply | Qty: 90 | Fill #0 | Status: AC

## 2024-01-11 MED FILL — Amlodipine Besylate Tab 5 MG (Base Equivalent): ORAL | 90 days supply | Qty: 90 | Fill #0 | Status: AC

## 2024-01-22 ENCOUNTER — Other Ambulatory Visit: Payer: Self-pay

## 2024-01-29 ENCOUNTER — Other Ambulatory Visit: Payer: Self-pay | Admitting: Family Medicine

## 2024-01-29 DIAGNOSIS — I10 Essential (primary) hypertension: Secondary | ICD-10-CM

## 2024-01-30 ENCOUNTER — Other Ambulatory Visit: Payer: Self-pay

## 2024-01-30 MED ORDER — HYDROCHLOROTHIAZIDE 25 MG PO TABS
25.0000 mg | ORAL_TABLET | Freq: Every day | ORAL | 1 refills | Status: DC
  Filled 2024-01-30: qty 90, 90d supply, fill #0

## 2024-02-06 ENCOUNTER — Other Ambulatory Visit: Payer: Self-pay

## 2024-03-15 ENCOUNTER — Other Ambulatory Visit: Payer: Self-pay

## 2024-04-03 ENCOUNTER — Other Ambulatory Visit: Payer: Self-pay | Admitting: Family Medicine

## 2024-04-03 ENCOUNTER — Other Ambulatory Visit: Payer: Self-pay

## 2024-04-03 DIAGNOSIS — I10 Essential (primary) hypertension: Secondary | ICD-10-CM

## 2024-04-03 MED FILL — Amlodipine Besylate Tab 5 MG (Base Equivalent): ORAL | 90 days supply | Qty: 90 | Fill #1 | Status: AC

## 2024-04-03 MED FILL — Levothyroxine Sodium Tab 175 MCG: ORAL | 90 days supply | Qty: 90 | Fill #1 | Status: AC

## 2024-04-12 ENCOUNTER — Other Ambulatory Visit: Payer: Self-pay | Admitting: Family Medicine

## 2024-04-12 ENCOUNTER — Other Ambulatory Visit: Payer: Self-pay

## 2024-04-12 DIAGNOSIS — I10 Essential (primary) hypertension: Secondary | ICD-10-CM

## 2024-04-13 ENCOUNTER — Other Ambulatory Visit: Payer: Self-pay

## 2024-04-13 MED FILL — Losartan Potassium Tab 100 MG: ORAL | 90 days supply | Qty: 45 | Fill #0 | Status: CN

## 2024-04-24 ENCOUNTER — Other Ambulatory Visit: Payer: Self-pay

## 2024-04-27 ENCOUNTER — Other Ambulatory Visit: Payer: Self-pay

## 2024-04-27 MED FILL — Losartan Potassium Tab 100 MG: ORAL | 90 days supply | Qty: 45 | Fill #0 | Status: AC

## 2024-05-04 ENCOUNTER — Ambulatory Visit (INDEPENDENT_AMBULATORY_CARE_PROVIDER_SITE_OTHER): Payer: 59 | Admitting: Family Medicine

## 2024-05-04 ENCOUNTER — Other Ambulatory Visit: Payer: Self-pay | Admitting: Family Medicine

## 2024-05-04 ENCOUNTER — Other Ambulatory Visit: Payer: Self-pay

## 2024-05-04 ENCOUNTER — Encounter: Payer: Self-pay | Admitting: Family Medicine

## 2024-05-04 ENCOUNTER — Ambulatory Visit: Payer: Self-pay | Admitting: Family Medicine

## 2024-05-04 VITALS — BP 122/84 | HR 59 | Temp 97.9°F | Resp 20 | Ht 61.0 in | Wt 212.2 lb

## 2024-05-04 DIAGNOSIS — R7989 Other specified abnormal findings of blood chemistry: Secondary | ICD-10-CM | POA: Diagnosis not present

## 2024-05-04 DIAGNOSIS — R7309 Other abnormal glucose: Secondary | ICD-10-CM | POA: Diagnosis not present

## 2024-05-04 DIAGNOSIS — E782 Mixed hyperlipidemia: Secondary | ICD-10-CM

## 2024-05-04 DIAGNOSIS — E785 Hyperlipidemia, unspecified: Secondary | ICD-10-CM

## 2024-05-04 DIAGNOSIS — E039 Hypothyroidism, unspecified: Secondary | ICD-10-CM

## 2024-05-04 DIAGNOSIS — E559 Vitamin D deficiency, unspecified: Secondary | ICD-10-CM

## 2024-05-04 DIAGNOSIS — I10 Essential (primary) hypertension: Secondary | ICD-10-CM

## 2024-05-04 DIAGNOSIS — Z6841 Body Mass Index (BMI) 40.0 and over, adult: Secondary | ICD-10-CM

## 2024-05-04 DIAGNOSIS — I700 Atherosclerosis of aorta: Secondary | ICD-10-CM | POA: Diagnosis not present

## 2024-05-04 DIAGNOSIS — Z1231 Encounter for screening mammogram for malignant neoplasm of breast: Secondary | ICD-10-CM | POA: Diagnosis not present

## 2024-05-04 LAB — LIPID PANEL
Cholesterol: 147 mg/dL (ref 0–200)
HDL: 61.5 mg/dL (ref >39.00)
LDL Cholesterol: 70 mg/dL (ref 0–99)
NonHDL: 85.23
Total CHOL/HDL Ratio: 2
Triglycerides: 76 mg/dL (ref 0.0–149.0)
VLDL: 15.2 mg/dL (ref 0.0–40.0)

## 2024-05-04 LAB — COMPREHENSIVE METABOLIC PANEL WITH GFR
ALT: 18 U/L (ref 0–35)
AST: 16 U/L (ref 0–37)
Albumin: 4.1 g/dL (ref 3.5–5.2)
Alkaline Phosphatase: 80 U/L (ref 39–117)
BUN: 21 mg/dL (ref 6–23)
CO2: 30 meq/L (ref 19–32)
Calcium: 9.5 mg/dL (ref 8.4–10.5)
Chloride: 104 meq/L (ref 96–112)
Creatinine, Ser: 0.76 mg/dL (ref 0.40–1.20)
GFR: 81.48 mL/min (ref >60.00)
Glucose, Bld: 91 mg/dL (ref 70–99)
Potassium: 4.1 meq/L (ref 3.5–5.1)
Sodium: 142 meq/L (ref 135–145)
Total Bilirubin: 1.6 mg/dL — ABNORMAL HIGH (ref 0.2–1.2)
Total Protein: 7 g/dL (ref 6.0–8.3)

## 2024-05-04 LAB — VITAMIN B12: Vitamin B-12: >1500 pg/mL — ABNORMAL HIGH (ref 211–911)

## 2024-05-04 LAB — TSH: TSH: <0.01 u[IU]/mL — ABNORMAL LOW (ref 0.35–5.50)

## 2024-05-04 LAB — VITAMIN D 25 HYDROXY (VIT D DEFICIENCY, FRACTURES): VITD: 24.85 ng/mL — ABNORMAL LOW (ref 30.00–100.00)

## 2024-05-04 LAB — HEMOGLOBIN A1C: Hgb A1c MFr Bld: 5.5 % (ref 4.6–6.5)

## 2024-05-04 MED ORDER — LEVOTHYROXINE SODIUM 150 MCG PO TABS: 150.0000 ug | ORAL_TABLET | Freq: Every day | ORAL | 0 refills | Status: DC

## 2024-05-04 MED ORDER — LEVOTHYROXINE SODIUM 175 MCG PO TABS
175.0000 ug | ORAL_TABLET | Freq: Every day | ORAL | 3 refills | Status: DC
  Filled 2024-05-04: qty 90, 90d supply, fill #0

## 2024-05-04 MED ORDER — ROSUVASTATIN CALCIUM 5 MG PO TABS
5.0000 mg | ORAL_TABLET | Freq: Every day | ORAL | 3 refills | Status: AC
  Filled 2024-05-04 – 2024-05-16 (×2): qty 90, 90d supply, fill #0
  Filled 2024-08-21: qty 90, 90d supply, fill #1
  Filled 2024-12-02: qty 90, 90d supply, fill #2

## 2024-05-04 MED ORDER — HYDROCHLOROTHIAZIDE 25 MG PO TABS
25.0000 mg | ORAL_TABLET | Freq: Every day | ORAL | 1 refills | Status: DC
  Filled 2024-05-04 – 2024-05-16 (×2): qty 90, 90d supply, fill #0

## 2024-05-04 MED ORDER — AMLODIPINE BESYLATE 5 MG PO TABS: 10.0000 mg | ORAL_TABLET | Freq: Every morning | ORAL | Status: DC

## 2024-05-04 MED ORDER — VITAMIN D (ERGOCALCIFEROL) 1.25 MG (50000 UNIT) PO CAPS
50000.0000 [IU] | ORAL_CAPSULE | ORAL | 1 refills | Status: DC
  Filled 2024-05-04 – 2024-05-16 (×2): qty 12, 84d supply, fill #0

## 2024-05-04 NOTE — Patient Instructions (Addendum)
 It was a pleasure meeting you today. Thank you for allowing me to take part in your health care.  Our goals for today as we discussed include:  Increase Amlodipine  to 10 mg daily If continues to tolerate well in couple of weeks notify MD and will send in refills. Continue Hydrochlorothiazide  25 mg daily  Discontinue the Losartan  for side effects of cough.   Continue to monitor blood pressure.  Goal <150/90  Refills sent for medications today  We will get some labs today.  If they are abnormal or we need to do something about them, I will call you.  If they are normal, I will send you a message on MyChart (if it is active) or a letter in the mail.  If you don't hear from us  in 2 weeks, please call the office at the number below.    Look at [facility] website to see if this may be an option for you with your weight loss. [facility] [address] [place] [phone]   Dr Ladd Picker, MD   Referral sent for Mammogram. Please call to schedule appointment. [facility] [address] [place], [place] [address] [phone]   This is a list of the screening recommended for you and due dates:  Health Maintenance  Topic Date Due   Pneumonia Vaccine (2 of 2 - PCV) 08/10/2022   Mammogram  04/14/2024   Flu Shot  07/13/2024   Colon Cancer Screening  11/27/2028   DTaP/Tdap/Td vaccine (3 - Td or Tdap) 01/10/2030   DEXA scan (bone density measurement)  Completed   Hepatitis C Screening  Completed   Zoster (Shingles) Vaccine  Completed   HPV Vaccine  Aged Out   Meningitis B Vaccine  Aged Out   COVID-19 Vaccine  Discontinued         If you have any questions or concerns, please do not hesitate to call the office at [phone].  I look forward to our next visit and until then take care and stay safe.  Regards,   Valli Gaw, MD   [facility]

## 2024-05-04 NOTE — Progress Notes (Unsigned)
 SUBJECTIVE:   Chief Complaint  Patient presents with   Medical Management of Chronic Issues    6 month follow up   HPI Presents for follow up chronic disease management  Discussed the use of AI scribe software for clinical note transcription with the patient, who gave verbal consent to proceed.  History of Present Illness Barbara Blake is a 68 year old female with hypertension who presents for follow-up on blood pressure management and weight loss.  She has gained two pounds recently and is concerned about her weight management. She previously lost forty pounds and is worried about maintaining her progress. She describes her current diet, stating she had oatmeal in the morning and chicken breast with salad for dinner, and has not eaten since 5 PM the previous day. She is concerned she might not be eating enough.  Regarding her blood pressure, she has been off losartan  for about a week due to being unable to visit the pharmacy. She notes a decrease in coughing since discontinuing losartan . She is currently taking Norvasc  and hydrochlorothiazide . Her blood pressure readings at work and at [employer] have been within normal range.  She is married and has grandchildren. No significant symptoms related to her blood pressure management, aside from a decrease in coughing after stopping losartan .     PERTINENT PMH / PSH: As above  OBJECTIVE:  BP 122/84   Pulse (!) 59   Temp 97.9 F (36.6 C)   Resp 20   Ht 5\' 1"  (1.549 m)   Wt 212 lb 4 oz (96.3 kg)   SpO2 99%   BMI 40.10 kg/m    Physical Exam Vitals reviewed.  Constitutional:      General: She is not in acute distress.    Appearance: Normal appearance. She is obese. She is not ill-appearing, toxic-appearing or diaphoretic.  Eyes:     General:        Right eye: No discharge.        Left eye: No discharge.     Conjunctiva/sclera: Conjunctivae normal.  Cardiovascular:     Rate and Rhythm: Normal rate and regular rhythm.      Heart sounds: Normal heart sounds.  Pulmonary:     Effort: Pulmonary effort is normal.     Breath sounds: Normal breath sounds.  Abdominal:     General: Bowel sounds are normal.  Musculoskeletal:        General: Normal range of motion.  Skin:    General: Skin is warm and dry.  Neurological:     General: No focal deficit present.     Mental Status: She is alert and oriented to person, place, and time. Mental status is at baseline.  Psychiatric:        Mood and Affect: Mood normal.        Behavior: Behavior normal.        Thought Content: Thought content normal.        Judgment: Judgment normal.           05/04/2024    1:13 PM 11/04/2023    9:11 AM 03/31/2023    3:14 PM 12/08/2022    9:28 AM 09/23/2022   10:20 AM  Depression screen PHQ 2/9  Decreased Interest 0 0 0 0 0  Down, Depressed, Hopeless 0 0 0 0 0  PHQ - 2 Score 0 0 0 0 0  Altered sleeping 0 0 0 0   Tired, decreased energy 0 0 0 0   Change  in appetite 0 0 0 0   Feeling bad or failure about yourself  0 0 0 0   Trouble concentrating 0 0 0 0   Moving slowly or fidgety/restless 0 0 0 0   Suicidal thoughts 0 0 0 0   PHQ-9 Score 0 0 0 0   Difficult doing work/chores Not difficult at all Not difficult at all  Not difficult at all       05/04/2024    1:13 PM 11/04/2023    9:11 AM 03/31/2023    3:15 PM 12/08/2022    9:28 AM  GAD 7 : Generalized Anxiety Score  Nervous, Anxious, on Edge 0 0 0 0  Control/stop worrying 0 0 0 0  Worry too much - different things 0 0 0 0  Trouble relaxing 0 0 0 0  Restless 0 0 0 0  Easily annoyed or irritable 0 0 0 0  Afraid - awful might happen 0 0 0 0  Total GAD 7 Score 0 0 0 0  Anxiety Difficulty Not difficult at all Not difficult at all  Not difficult at all    ASSESSMENT/PLAN:  Essential hypertension Assessment & Plan: Blood pressure controlled with Norvasc  and hydrochlorothiazide . Self discontinued Losartan  due to cough, now improved. Amlodipine  increased to manage blood  pressure. Discussed stress and weight impact on blood pressure. - Discontinue losartan . - Increase amlodipine  to 10 mg daily. - Refill Hydrochlorothiazide  25 mg daily - Monitor blood pressure at home. - Report leg swelling or increased blood pressure. - Consider alternative medications if side effects occur.  Orders: -     Comprehensive metabolic panel with GFR -     amLODIPine  Besylate; Take 2 tablets (10 mg total) by mouth every morning. -     hydroCHLOROthiazide ; Take 1 tablet (25 mg total) by mouth daily.  Dispense: 90 tablet; Refill: 1  Breast cancer screening by mammogram -     3D Screening Mammogram, Left and Right  Hypothyroidism, unspecified type Assessment & Plan: Chronic.  Asymptomatic.   Continue levothyroxine  175 mcg daily Check TSH   Addendum TSH low <0.01 Decrease Levothyroxine  from 175 mcg to 150 mcg Repeat TSH in 4 weeks.  Patient will have labs completed at [facility] for 06/23 Discussed results with patient and all questions answered  Orders: -     TSH  Aortic atherosclerosis (HCC) -     Lipid panel  Hyperlipidemia, unspecified hyperlipidemia type Assessment & Plan: Well controlled Refill Crestor  5 mg daily   Orders: -     Lipid panel  Low vitamin B12 level Assessment & Plan: Taking Vitamin B12 due to previous deficiency. -Continue Vitamin B12 1000mg  daily. -Check Vitamin B 12 level  Orders: -     Vitamin B12  Vitamin D  deficiency Assessment & Plan: -Continue Vitamin D  1000mg  daily. -Check Vitamin D  level  Orders: -     VITAMIN D  25 Hydroxy (Vit-D Deficiency, Fractures)  Abnormal glucose -     Hemoglobin A1c  Mixed hyperlipidemia Assessment & Plan: Well controlled Refill Crestor  5 mg daily   Orders: -     Rosuvastatin  Calcium ; Take 1 tablet (5 mg total) by mouth daily.  Dispense: 90 tablet; Refill: 3  Morbid obesity with BMI of 40.0-44.9, adult [facility]) Assessment & Plan: Discussed weight gain and management options. Provided  information on [facility] program. Discussed potential use of weight loss medications and insurance issues. Emphasized dietary and lifestyle changes for sustainable weight loss. - Provide information on [facility] Healthy  Weight and Wellness program. - Discuss potential use of weight loss medications if needed. - Encourage dietary changes and lifestyle modifications.       PDMP reviewed  Return if symptoms worsen or fail to improve, for PCP.  Valli Gaw, MD

## 2024-05-09 ENCOUNTER — Encounter: Payer: Self-pay | Admitting: Family Medicine

## 2024-05-09 NOTE — Assessment & Plan Note (Signed)
-  Continue Vitamin D  1000mg  daily. -Check Vitamin D  level

## 2024-05-09 NOTE — Assessment & Plan Note (Signed)
 Well controlled Refill Crestor  5 mg daily

## 2024-05-09 NOTE — Assessment & Plan Note (Signed)
 Chronic.  Asymptomatic.   Continue levothyroxine  175 mcg daily Check TSH   Addendum TSH low <0.01 Decrease Levothyroxine  from 175 mcg to 150 mcg Repeat TSH in 4 weeks.  Patient will have labs completed at [facility] for 06/23 Discussed results with patient and all questions answered

## 2024-05-09 NOTE — Assessment & Plan Note (Signed)
 Taking Vitamin B12 due to previous deficiency. -Continue Vitamin B12 1000mg  daily. -Check Vitamin B 12 level

## 2024-05-09 NOTE — Assessment & Plan Note (Addendum)
 Blood pressure controlled with Norvasc  and hydrochlorothiazide . Self discontinued Losartan  due to cough, now improved. Amlodipine  increased to manage blood pressure. Discussed stress and weight impact on blood pressure. - Discontinue losartan . - Increase amlodipine  to 10 mg daily. - Refill Hydrochlorothiazide  25 mg daily - Monitor blood pressure at home. - Report leg swelling or increased blood pressure. - Consider alternative medications if side effects occur.

## 2024-05-09 NOTE — Assessment & Plan Note (Addendum)
 Discussed weight gain and management options. Provided information on [facility] program. Discussed potential use of weight loss medications and insurance issues. Emphasized dietary and lifestyle changes for sustainable weight loss. - Provide information on [facility] Weight and Wellness program. - Discuss potential use of weight loss medications if needed. - Encourage dietary changes and lifestyle modifications.

## 2024-05-15 ENCOUNTER — Other Ambulatory Visit: Payer: Self-pay

## 2024-05-16 ENCOUNTER — Other Ambulatory Visit: Payer: Self-pay

## 2024-06-13 ENCOUNTER — Encounter

## 2024-06-18 ENCOUNTER — Other Ambulatory Visit: Payer: Self-pay | Admitting: Family Medicine

## 2024-06-18 ENCOUNTER — Other Ambulatory Visit: Payer: Self-pay

## 2024-06-18 DIAGNOSIS — I10 Essential (primary) hypertension: Secondary | ICD-10-CM

## 2024-06-19 ENCOUNTER — Other Ambulatory Visit: Payer: Self-pay

## 2024-06-20 ENCOUNTER — Other Ambulatory Visit: Payer: Self-pay

## 2024-06-20 ENCOUNTER — Telehealth: Payer: Self-pay | Admitting: Family Medicine

## 2024-06-20 DIAGNOSIS — I10 Essential (primary) hypertension: Secondary | ICD-10-CM

## 2024-06-20 MED ORDER — AMLODIPINE BESYLATE 5 MG PO TABS
10.0000 mg | ORAL_TABLET | Freq: Every morning | ORAL | 1 refills | Status: DC
  Filled 2024-06-20: qty 180, 90d supply, fill #0

## 2024-06-20 NOTE — Telephone Encounter (Signed)
 Copied from CRM [id number]. Topic: Clinical - Medication Refill >> Jun 20, 2024  2:29 PM Henretta I wrote: Medication:  amLODipine  (NORVASC ) 5 MG tablet   Has the patient contacted their pharmacy? Yes, pharm stated it was discontinued   (Agent: If no, request that the patient contact the pharmacy for the refill. If patient does not wish to contact the pharmacy document the reason why and proceed with request.) (Agent: If yes, when and what did the pharmacy advise?)  This is the patient's preferred pharmacy:  [facility] [address] [place] [address] Phone: [phone] Fax: [phone]  Is this the correct pharmacy for this prescription? Yes If no, delete pharmacy and type the correct one.   Has the prescription been filled recently? No  Is the patient out of the medication? Yes  Has the patient been seen for an appointment in the last year OR does the patient have an upcoming appointment? Yes  Can we respond through MyChart? Yes  Agent: Please be advised that Rx refills may take up to 3 business days. We ask that you follow-up with your pharmacy.

## 2024-06-21 ENCOUNTER — Other Ambulatory Visit: Payer: Self-pay

## 2024-06-26 ENCOUNTER — Other Ambulatory Visit: Payer: Self-pay

## 2024-06-27 ENCOUNTER — Ambulatory Visit
Admission: RE | Admit: 2024-06-27 | Discharge: 2024-06-27 | Disposition: A | Discharge status: Home / Self Care | Source: Ambulatory Visit | Attending: Family Medicine | Admitting: Family Medicine

## 2024-06-27 ENCOUNTER — Other Ambulatory Visit: Payer: Self-pay | Admitting: Family Medicine

## 2024-06-27 ENCOUNTER — Other Ambulatory Visit: Payer: Self-pay

## 2024-06-27 DIAGNOSIS — Z1231 Encounter for screening mammogram for malignant neoplasm of breast: Secondary | ICD-10-CM | POA: Diagnosis not present

## 2024-06-27 DIAGNOSIS — E039 Hypothyroidism, unspecified: Secondary | ICD-10-CM

## 2024-06-28 ENCOUNTER — Other Ambulatory Visit: Payer: Self-pay

## 2024-06-28 MED ORDER — LEVOTHYROXINE SODIUM 150 MCG PO TABS
150.0000 ug | ORAL_TABLET | Freq: Every day | ORAL | 0 refills | Status: DC
  Filled 2024-06-28: qty 30, 30d supply, fill #0

## 2024-06-28 MED ORDER — LEVOTHYROXINE SODIUM 175 MCG PO TABS
175.0000 ug | ORAL_TABLET | Freq: Every day | ORAL | 0 refills | Status: DC
  Filled 2024-06-28: qty 30, 30d supply, fill #0

## 2024-06-28 NOTE — Addendum Note (Signed)
 Addended by: MARYLYNN VERNEITA CROME on: 06/28/2024 06:12 AM   Modules accepted: Orders

## 2024-08-02 ENCOUNTER — Telehealth: Payer: Self-pay

## 2024-08-02 NOTE — Telephone Encounter (Signed)
 Copied from CRM #[id number]. Topic: General - Other >> Aug 02, 2024  1:11 PM Sasha M wrote: Reason for CRM: Pt received a letter from [employer], [place], stating that Dr. Abbey may not be in network and they could possibly be charged. Upcoming appt in Jan. Pt wanted to know if Dr Abbey is in network or not so she can schedule with someone who is. Please advise pt via mychart or [phone]

## 2024-08-06 ENCOUNTER — Other Ambulatory Visit: Payer: Self-pay

## 2024-08-06 ENCOUNTER — Other Ambulatory Visit: Payer: Self-pay | Admitting: Internal Medicine

## 2024-08-06 DIAGNOSIS — E039 Hypothyroidism, unspecified: Secondary | ICD-10-CM

## 2024-08-06 NOTE — Telephone Encounter (Unsigned)
 Copied from CRM #[id number]. Topic: Clinical - Medication Refill >> Aug 06, 2024  3:45 PM Abigail D wrote: Medication: levothyroxine  (SYNTHROID ) 150 MCG tablet [[id number]]  Patient is a former of Dr. Hope and she would like a refill of this medication - She is currently out of it and has been having a lot of trouble with refills due to being in [facility]. She is requesting a 90 day supply if possible too.  Has the patient contacted their pharmacy? Yes (Agent: If no, request that the patient contact the pharmacy for the refill. If patient does not wish to contact the pharmacy document the reason why and proceed with request.) (Agent: If yes, when and what did the pharmacy advise?)  This is the patient's preferred pharmacy:  [facility] [address] [place] [address] Phone: [phone] Fax: [phone]  Is this the correct pharmacy for this prescription? Yes If no, delete pharmacy and type the correct one.   Has the prescription been filled recently? No  Is the patient out of the medication? Yes  Has the patient been seen for an appointment in the last year OR does the patient have an upcoming appointment? Yes  Can we respond through MyChart? Yes  Agent: Please be advised that Rx refills may take up to 3 business days. We ask that you follow-up with your pharmacy.

## 2024-08-07 ENCOUNTER — Telehealth: Payer: Self-pay

## 2024-08-07 ENCOUNTER — Other Ambulatory Visit (INDEPENDENT_AMBULATORY_CARE_PROVIDER_SITE_OTHER)

## 2024-08-07 ENCOUNTER — Other Ambulatory Visit: Payer: Self-pay

## 2024-08-07 DIAGNOSIS — R7989 Other specified abnormal findings of blood chemistry: Secondary | ICD-10-CM | POA: Diagnosis not present

## 2024-08-07 NOTE — Telephone Encounter (Signed)
 Can we change the orders to your name to draw labs? Per Andrez, we cannot draw the labs under Dr Hope.

## 2024-08-07 NOTE — Telephone Encounter (Signed)
 See my chart message. TSH drawn today. Wait for results to determine dose of synthroid  needed. Pt aware.

## 2024-08-07 NOTE — Telephone Encounter (Signed)
 Lab reordered. Pt is aware. Please relay message from Dr Glendia to patient below and notify office. Thanks.

## 2024-08-07 NOTE — Telephone Encounter (Signed)
 Copied from CRM #[id number]. Topic: General - Other >> Aug 07, 2024  1:56 PM Frederich PARAS wrote: Reason for CRM: pt calling , she had a physical with [facility] 11/04/23 , the apt was never submitted on [employer] management but it is showing on mychart. The apt was down as a office visit. Pt needs it to show on active health management side to prove the physical was done. PT Callback # is [phone]

## 2024-08-07 NOTE — Addendum Note (Signed)
 Addended by: MARYLEN PRO A on: 08/07/2024 11:58 AM   Modules accepted: Orders

## 2024-08-07 NOTE — Telephone Encounter (Signed)
 Former pt of Dr Hope. Her thyroid  medication was changed with last lab. This is the first check since the dose was changed. It appears she is getting her thyroid  labs drawn today. Results should be back in 24 hours and we will know what dose of thyroid  medication needs to be sent in.  Confirm has been taking synthroid  150mcg q day. Hold for results.

## 2024-08-07 NOTE — Telephone Encounter (Signed)
 Patient present for labs. Will adjust dose depending on labs

## 2024-08-07 NOTE — Telephone Encounter (Signed)
 Copied from CRM #[id number]. Topic: Clinical - Prescription Issue >> Aug 07, 2024 11:39 AM Drema MATSU wrote: Reason for CRM: Patient called to see why herlevothyroxine (SYNTHROID ) 150 MCG tablet was denied. Advised patient that she needs a lab appointment for refill. Patient stated that she needs her medications and wants to know if it can be called in. Lab appointment is scheduled for today at 04:00. She is requesting 90 day supply.

## 2024-08-07 NOTE — Telephone Encounter (Unsigned)
 Copied from CRM #[id number]. Topic: General - Other >> Aug 07, 2024  2:51 PM [place] wrote: Reason for CRM: patient asking for Learta Sueanne BIRCH, LPN to return call when available

## 2024-08-07 NOTE — Telephone Encounter (Signed)
 Once TSH results will see if dose needs to be changed. Hold for results.

## 2024-08-07 NOTE — Progress Notes (Signed)
 TSH reordered under Dr Pete name per Dr Glendia.

## 2024-08-08 ENCOUNTER — Ambulatory Visit

## 2024-08-08 ENCOUNTER — Other Ambulatory Visit: Payer: Self-pay

## 2024-08-08 VITALS — BP 130/80 | HR 68 | Temp 98.7°F | Ht 61.0 in | Wt 215.4 lb

## 2024-08-08 DIAGNOSIS — E785 Hyperlipidemia, unspecified: Secondary | ICD-10-CM | POA: Diagnosis not present

## 2024-08-08 DIAGNOSIS — Z131 Encounter for screening for diabetes mellitus: Secondary | ICD-10-CM | POA: Diagnosis not present

## 2024-08-08 DIAGNOSIS — I10 Essential (primary) hypertension: Secondary | ICD-10-CM

## 2024-08-08 DIAGNOSIS — J4520 Mild intermittent asthma, uncomplicated: Secondary | ICD-10-CM

## 2024-08-08 DIAGNOSIS — Z0000 Encounter for general adult medical examination without abnormal findings: Secondary | ICD-10-CM | POA: Diagnosis not present

## 2024-08-08 DIAGNOSIS — R918 Other nonspecific abnormal finding of lung field: Secondary | ICD-10-CM

## 2024-08-08 DIAGNOSIS — Z6841 Body Mass Index (BMI) 40.0 and over, adult: Secondary | ICD-10-CM

## 2024-08-08 DIAGNOSIS — E559 Vitamin D deficiency, unspecified: Secondary | ICD-10-CM

## 2024-08-08 DIAGNOSIS — E039 Hypothyroidism, unspecified: Secondary | ICD-10-CM

## 2024-08-08 DIAGNOSIS — R7989 Other specified abnormal findings of blood chemistry: Secondary | ICD-10-CM | POA: Diagnosis not present

## 2024-08-08 LAB — TSH: TSH: 0.03 u[IU]/mL — ABNORMAL LOW (ref 0.35–5.50)

## 2024-08-08 MED ORDER — LEVOTHYROXINE SODIUM 125 MCG PO TABS
125.0000 ug | ORAL_TABLET | Freq: Every day | ORAL | 3 refills | Status: AC
  Filled 2024-08-08: qty 90, 90d supply, fill #0
  Filled 2024-11-01 (×2): qty 90, 90d supply, fill #1

## 2024-08-08 MED ORDER — AMLODIPINE BESYLATE 10 MG PO TABS
10.0000 mg | ORAL_TABLET | Freq: Every day | ORAL | 3 refills | Status: AC
  Filled 2024-08-08: qty 90, 90d supply, fill #0
  Filled 2024-12-02: qty 90, 90d supply, fill #1

## 2024-08-08 MED ORDER — HYDROCHLOROTHIAZIDE 25 MG PO TABS
25.0000 mg | ORAL_TABLET | Freq: Every day | ORAL | 3 refills | Status: AC
  Filled 2024-08-08: qty 90, 90d supply, fill #0
  Filled 2024-12-02: qty 90, 90d supply, fill #1

## 2024-08-08 NOTE — Progress Notes (Signed)
 Established Patient Office Visit Annual physical TOC from Dr. Hope   Subjective  Patient ID: Barbara Blake, female    DOB: 1956-09-30  Age: 68 y.o. MRN: [id number]  Chief Complaint  Patient presents with   Establish Care   Annual Exam    She  has a past medical history of Anxiety, Asthma, Asthma, well controlled (11/28/2017), Cholelithiasis (04/25/2014), Fatty liver (01/27/2018), Gallstones, History of kidney stones, HTN (hypertension), Hypothyroidism, Kidney stones (05/29/2018), Left sided sciatica (04/04/2014), Low back pain, Lung nodule, Obesity, Rectal polyp, and Vitamin D  deficiency.  HPI Discussed the use of AI scribe software for clinical note transcription with the patient, who gave verbal consent to proceed.  History of Present Illness Barbara Blake is a 68 year old female who presents for annual physical exam, transfer of care and follow-up on chronic medical conditions.    Patient has acquired hypothyroidism and has been taking Synthroid  150 mcg daily.  Most recent TSH from 08/07/2024 shows slightly improved TSH from 3 months ago but still low.  She takes Synthroid  first thing in the morning.  Patient reports she has had hard time with weight loss.  Previous PCP was working on adjusting thyroid  medication.   She has experienced weight fluctuations, previously weighing 240 pounds, dropping to 209 pounds, and now increasing again, which she attributes to stress. She has been trying to manage her weight through dietary changes, including cutting down on eggs, cheese, and red meat, and eating oatmeal for breakfast, sandwiches or salads for lunch, and regular dinners. She drinks hot tea without sugar and avoids sodas and sugars. She exercises by walking on a treadmill for 30-40 minutes daily.  Her cholesterol levels improved after starting Crestor  and making dietary changes. She is currently taking 10 mg of amlodipine  and hydrochlorothiazide . She completed a course of 50,000  units of vitamin D .  She has a chronic cough lasting over 15 years, initially thought to be asthma-related. She experiences voice loss when speaking a lot and occasional wheezing. She uses Airsupra  for asthma management. No pulmonary function test has been done, and she has not seen a pulmonologist.  She reports symptoms suggestive of arthritis, with stiffness in her legs and knees after sitting or lying down for extended periods, which improves with movement.  Family history includes a mother with colon cancer. No family history of thyroid  cancer, MEN.   No chest pain, palpitations, or leg swelling. Chronic cough, occasional wheezing, and voice loss. No acid reflux relief from medication. No snoring or waking up from sleep. Stiffness in legs and knees after prolonged sitting or lying down.  ROS As per HPI    Objective:     BP 130/80 (BP Location: Right Arm, Patient Position: Sitting, Cuff Size: Normal)   Pulse 68   Temp 98.7 F (37.1 C) (Oral)   Ht 5' 1 (1.549 m)   Wt 215 lb 6.4 oz (97.7 kg)   SpO2 97%   BMI 40.70 kg/m      08/08/2024    2:06 PM 05/04/2024    1:13 PM 11/04/2023    9:11 AM  Depression screen PHQ 2/9  Decreased Interest 0 0 0  Down, Depressed, Hopeless 0 0 0  PHQ - 2 Score 0 0 0  Altered sleeping 0 0 0  Tired, decreased energy 0 0 0  Change in appetite 0 0 0  Feeling bad or failure about yourself  0 0 0  Trouble concentrating 0 0 0  Moving slowly or  fidgety/restless 0 0 0  Suicidal thoughts 0 0 0  PHQ-9 Score 0 0 0  Difficult doing work/chores Not difficult at all Not difficult at all Not difficult at all      08/08/2024    2:07 PM 05/04/2024    1:13 PM 11/04/2023    9:11 AM 03/31/2023    3:15 PM  GAD 7 : Generalized Anxiety Score  Nervous, Anxious, on Edge 0 0 0 0  Control/stop worrying 0 0 0 0  Worry too much - different things 0 0 0 0  Trouble relaxing 0 0 0 0  Restless 0 0 0 0  Easily annoyed or irritable 0 0 0 0  Afraid - awful might happen 0  0 0 0  Total GAD 7 Score 0 0 0 0  Anxiety Difficulty Not difficult at all Not difficult at all Not difficult at all       08/08/2024    2:06 PM 05/04/2024    1:13 PM 11/04/2023    9:11 AM  Depression screen PHQ 2/9  Decreased Interest 0 0 0  Down, Depressed, Hopeless 0 0 0  PHQ - 2 Score 0 0 0  Altered sleeping 0 0 0  Tired, decreased energy 0 0 0  Change in appetite 0 0 0  Feeling bad or failure about yourself  0 0 0  Trouble concentrating 0 0 0  Moving slowly or fidgety/restless 0 0 0  Suicidal thoughts 0 0 0  PHQ-9 Score 0 0 0  Difficult doing work/chores Not difficult at all Not difficult at all Not difficult at all      08/08/2024    2:07 PM 05/04/2024    1:13 PM 11/04/2023    9:11 AM 03/31/2023    3:15 PM  GAD 7 : Generalized Anxiety Score  Nervous, Anxious, on Edge 0 0 0 0  Control/stop worrying 0 0 0 0  Worry too much - different things 0 0 0 0  Trouble relaxing 0 0 0 0  Restless 0 0 0 0  Easily annoyed or irritable 0 0 0 0  Afraid - awful might happen 0 0 0 0  Total GAD 7 Score 0 0 0 0  Anxiety Difficulty Not difficult at all Not difficult at all Not difficult at all    SDOH Screenings   Depression (PHQ2-9): Low Risk  (08/08/2024)  Tobacco Use: Low Risk  (08/08/2024)     Physical Exam Constitutional:      General: She is not in acute distress.    Appearance: Normal appearance. She is obese.  HENT:     Head: Normocephalic and atraumatic.     Right Ear: Tympanic membrane normal.     Left Ear: Tympanic membrane normal.     Nose: No congestion.     Mouth/Throat:     Mouth: Mucous membranes are moist.  Neck:     Thyroid : No thyroid  mass or thyroid  tenderness.  Cardiovascular:     Rate and Rhythm: Normal rate and regular rhythm.  Pulmonary:     Effort: Pulmonary effort is normal.     Breath sounds: Normal breath sounds. No wheezing.  Abdominal:     General: Bowel sounds are normal.     Palpations: Abdomen is soft.     Tenderness: There is no guarding or  rebound.  Musculoskeletal:     Cervical back: Neck supple. No rigidity or tenderness.     Right lower leg: No edema.     Left lower  leg: No edema.  Lymphadenopathy:     Cervical: No cervical adenopathy.  Skin:    General: Skin is warm.  Neurological:     Mental Status: She is alert and oriented to person, place, and time.  Psychiatric:        Mood and Affect: Mood normal.        Behavior: Behavior normal.        No results found for any visits on 08/08/24.  The 10-year ASCVD risk score (Arnett DK, et al., 2019) is: 7.1%    Following labs reviewed:  Component     Latest Ref Rng 05/04/2024 08/07/2024  Sodium     135 - 145 mEq/L 142    Potassium     3.5 - 5.1 mEq/L 4.1    Chloride     96 - 112 mEq/L 104    CO2     19 - 32 mEq/L 30    Glucose     70 - 99 mg/dL 91    BUN     6 - 23 mg/dL 21    Creatinine     9.59 - 1.20 mg/dL 9.23    Total Bilirubin     0.2 - 1.2 mg/dL 1.6 (H)    Alkaline Phosphatase     39 - 117 U/L 80    AST     0 - 37 U/L 16    ALT     0 - 35 U/L 18    Total Protein     6.0 - 8.3 g/dL 7.0    Albumin     3.5 - 5.2 g/dL 4.1    GFR     >39.99 mL/min 81.48    Calcium      8.4 - 10.5 mg/dL 9.5    Cholesterol     0 - 200 mg/dL 852    Triglycerides     0.0 - 149.0 mg/dL 23.9    HDL Cholesterol     >39.00 mg/dL 38.49    VLDL     0.0 - 40.0 mg/dL 84.7    LDL (calc)     0 - 99 mg/dL 70    Total CHOL/HDL Ratio 2    NonHDL 85.23    TSH     0.35 - 5.50 uIU/mL <0.01 Repeated and verified X2. (L)  0.03 (L)   Vitamin B12     211 - 911 pg/mL >1500 (H)    VITD     30.00 - 100.00 ng/mL 24.85 (L)    Hemoglobin A1C     4.6 - 6.5 % 5.5       Assessment & Plan:   Annual physical exam Assessment & Plan: Up-to-date on DEXA, breast cancer screening, colorectal cancer screening. Patient qualifies for pneumonia immunization.  Counseled on updating this through local pharmacy.  If she is not able to do this we can update it through our clinic during  her next visit.   Hypothyroidism, unspecified type Assessment & Plan: With low TSH on 150 mcg Synthroid .  Reduce dose of Synthroid  from 150 to 125 mcg daily.  Check TSH, T4 in 6 weeks.  Orders: -     TSH; Future -     T4, free; Future  Hyperlipidemia, unspecified hyperlipidemia type Assessment & Plan: LDL significantly improved since starting Crestor  20 mg, continue.  Orders: -     Comprehensive metabolic panel with GFR  Low vitamin B12 level Assessment & Plan: Currently on 1000 mcg B12 daily, check B12 level  today.  Orders: -     Vitamin B12  Essential hypertension Assessment & Plan: Continue hydrochlorothiazide  25 mg daily, amlodipine  10 mg daily.  Check CMP.  Orders: -     Comprehensive metabolic panel with GFR -     amLODIPine  Besylate; Take 1 tablet (10 mg total) by mouth daily.  Dispense: 90 tablet; Refill: 3 -     hydroCHLOROthiazide ; Take 1 tablet (25 mg total) by mouth daily.  Dispense: 90 tablet; Refill: 3  Encounter for screening examination for intermediate hyperglycemia and diabetes mellitus Assessment & Plan: Check A1c.  Orders: -     Hemoglobin A1c  Vitamin D  deficiency Assessment & Plan: Known history of recurrent vitamin D  deficiency.  Check vitamin D  level.  If continues to be low recommend staying on vitamin D  50,000 units once weekly.  If vitamin D  is normalized she can start taking over-the-counter 1000 units vitamin D  daily.  Lab ordered.  Orders: -     VITAMIN D  25 Hydroxy (Vit-D Deficiency, Fractures)  Mild intermittent asthma without complication Assessment & Plan: Recommend pulmonology evaluation with PFT.  Continue 1 puff as needed.  Differential diagnosis includes GERD, post nasal drip.  In the past she has tried treatment for GERD without improvement in chronic cough, wheezing symptoms.  Orders: -     Pulmonary Visit  Morbid obesity with BMI of 40.0-44.9, adult [facility]) Assessment & Plan: Since her last visit with her previous PCP  patient has incorporated a healthy diet including cutting down on carbohydrate, red meat.  She exercises daily.  She has not been able to achieve goal weight.  Discussed continuing with intensive lifestyle modification and different pharmacological intervention for assistance in weight loss.  Patient does not have history of thyroid  cancer or family history of medullary thyroid  cancer.  Follow-up in 6 to 8 weeks to initiate either Wegovy or Zepbound.   Pulmonary nodules Assessment & Plan: Patient has CT chest done in the past to follow-up on pulmonary nodules.   12/12/2019 CT showed:  scattered tiny pulmonary nodules bilaterally, suspicious for postinflammatory change. The visualized nodules in the lower lobes are stable from the prior CT from 2019 consistent with a prior infectious etiology. No sizable nodules are seen. Given the low risk history, no further follow-up is recommended.  Given chronic cough, asthma symptom recommend pulmonology referral for further evaluation even though pulmonary nodules are likely benign.    Orders: -     Pulmonary Visit  Other orders -     Levothyroxine  Sodium; Take 1 tablet (125 mcg total) by mouth daily.  Dispense: 90 tablet; Refill: 3    Return in about 6 weeks (around 09/19/2024) for Weight, thyroid  follow up.   Luke Shade, MD

## 2024-08-08 NOTE — Assessment & Plan Note (Addendum)
 Recommend pulmonology evaluation with PFT.  Continue 1 puff as needed.  Differential diagnosis includes GERD, post nasal drip.  In the past she has tried treatment for GERD without improvement in chronic cough, wheezing symptoms.

## 2024-08-08 NOTE — Telephone Encounter (Signed)
 Pt is seeing Dr Abbey this PM

## 2024-08-08 NOTE — Assessment & Plan Note (Signed)
 Since her last visit with her previous PCP patient has incorporated a healthy diet including cutting down on carbohydrate, red meat.  She exercises daily.  She has not been able to achieve goal weight.  Discussed continuing with intensive lifestyle modification and different pharmacological intervention for assistance in weight loss.  Patient does not have history of thyroid  cancer or family history of medullary thyroid  cancer.  Follow-up in 6 to 8 weeks to initiate either Wegovy or Zepbound.

## 2024-08-08 NOTE — Assessment & Plan Note (Signed)
 Check A1c.

## 2024-08-08 NOTE — Assessment & Plan Note (Signed)
 Continue hydrochlorothiazide  25 mg daily, amlodipine  10 mg daily.  Check CMP.

## 2024-08-08 NOTE — Assessment & Plan Note (Signed)
 LDL significantly improved since starting Crestor  20 mg, continue.

## 2024-08-08 NOTE — Patient Instructions (Addendum)
 Reduce dose of Synthroid  from 150 mcg to 125 mcg daily. Repeat thyroid  levels in 6 weeks. Follow up with Dr. Abbey in 6 weeks to start on weight loss medication.   Start taking vitamin D  1000 units daily. If low I will send you prescription strength vitamin D .   I am referring you to pulmonology to get evaluation into chronic cough, asthma. If you do not hear from them in couple of weeks please reach out to me.   Continue extensive lifestyle modification to help with weight loss.   You qualify for pneumonia vaccine, please check with pharmacy to see if you can update this through the pharmacy.

## 2024-08-08 NOTE — Assessment & Plan Note (Signed)
 With low TSH on 150 mcg Synthroid .  Reduce dose of Synthroid  from 150 to 125 mcg daily.  Check TSH, T4 in 6 weeks.

## 2024-08-08 NOTE — Assessment & Plan Note (Signed)
 Up-to-date on DEXA, breast cancer screening, colorectal cancer screening. Patient qualifies for pneumonia immunization.  Counseled on updating this through local pharmacy.  If she is not able to do this we can update it through our clinic during her next visit.

## 2024-08-08 NOTE — Assessment & Plan Note (Addendum)
 Patient has CT chest done in the past to follow-up on pulmonary nodules.   12/12/2019 CT showed:  scattered tiny pulmonary nodules bilaterally, suspicious for postinflammatory change. The visualized nodules in the lower lobes are stable from the prior CT from 2019 consistent with a prior infectious etiology. No sizable nodules are seen. Given the low risk history, no further follow-up is recommended.  Given chronic cough, asthma symptom recommend pulmonology referral for further evaluation even though pulmonary nodules are likely benign.

## 2024-08-08 NOTE — Telephone Encounter (Signed)
 Pt has CPE scheduled for 08/08/2024 With Dr. Abbey

## 2024-08-08 NOTE — Assessment & Plan Note (Signed)
 Currently on 1000 mcg B12 daily, check B12 level today.

## 2024-08-08 NOTE — Assessment & Plan Note (Signed)
 Continue Crestor  20 mg daily.  Most recent LDL within goal.

## 2024-08-08 NOTE — Assessment & Plan Note (Signed)
 Known history of recurrent vitamin D  deficiency.  Check vitamin D  level.  If continues to be low recommend staying on vitamin D  50,000 units once weekly.  If vitamin D  is normalized she can start taking over-the-counter 1000 units vitamin D  daily.  Lab ordered.

## 2024-08-09 ENCOUNTER — Ambulatory Visit: Payer: Self-pay | Admitting: Internal Medicine

## 2024-08-09 LAB — COMPREHENSIVE METABOLIC PANEL WITH GFR
ALT: 27 U/L (ref 0–35)
AST: 20 U/L (ref 0–37)
Albumin: 4 g/dL (ref 3.5–5.2)
Alkaline Phosphatase: 89 U/L (ref 39–117)
BUN: 19 mg/dL (ref 6–23)
CO2: 32 meq/L (ref 19–32)
Calcium: 9.6 mg/dL (ref 8.4–10.5)
Chloride: 99 meq/L (ref 96–112)
Creatinine, Ser: 0.79 mg/dL (ref 0.40–1.20)
GFR: 77.63 mL/min (ref >60.00)
Glucose, Bld: 99 mg/dL (ref 70–99)
Potassium: 3.9 meq/L (ref 3.5–5.1)
Sodium: 141 meq/L (ref 135–145)
Total Bilirubin: 1.7 mg/dL — ABNORMAL HIGH (ref 0.2–1.2)
Total Protein: 7 g/dL (ref 6.0–8.3)

## 2024-08-09 LAB — VITAMIN B12: Vitamin B-12: 906 pg/mL (ref 211–911)

## 2024-08-09 LAB — VITAMIN D 25 HYDROXY (VIT D DEFICIENCY, FRACTURES): VITD: 46.94 ng/mL (ref 30.00–100.00)

## 2024-08-09 LAB — HEMOGLOBIN A1C: Hgb A1c MFr Bld: 5.8 % (ref 4.6–6.5)

## 2024-08-09 NOTE — Telephone Encounter (Signed)
 This was already discussed during her visit with me on 08/08/24 as documented on my note.   No further action required.  Luke Shade, MD

## 2024-08-10 ENCOUNTER — Ambulatory Visit: Payer: Self-pay

## 2024-08-10 DIAGNOSIS — E559 Vitamin D deficiency, unspecified: Secondary | ICD-10-CM

## 2024-08-14 ENCOUNTER — Other Ambulatory Visit
Admission: RE | Admit: 2024-08-14 | Discharge: 2024-08-14 | Disposition: A | Discharge status: Home / Self Care | Source: Ambulatory Visit | Attending: Pulmonary Disease | Admitting: Pulmonary Disease

## 2024-08-14 ENCOUNTER — Ambulatory Visit
Admission: RE | Admit: 2024-08-14 | Discharge: 2024-08-14 | Disposition: A | Discharge status: Home / Self Care | Source: Ambulatory Visit | Attending: Pulmonary Disease | Admitting: Pulmonary Disease

## 2024-08-14 ENCOUNTER — Ambulatory Visit: Admitting: Pulmonary Disease

## 2024-08-14 ENCOUNTER — Encounter: Payer: Self-pay | Admitting: Pulmonary Disease

## 2024-08-14 ENCOUNTER — Other Ambulatory Visit: Payer: Self-pay

## 2024-08-14 VITALS — BP 120/70 | HR 78 | Temp 98.2°F | Ht 61.0 in | Wt 218.0 lb

## 2024-08-14 DIAGNOSIS — R053 Chronic cough: Secondary | ICD-10-CM | POA: Insufficient documentation

## 2024-08-14 DIAGNOSIS — R0602 Shortness of breath: Secondary | ICD-10-CM | POA: Diagnosis not present

## 2024-08-14 DIAGNOSIS — J45991 Cough variant asthma: Secondary | ICD-10-CM

## 2024-08-14 DIAGNOSIS — R768 Other specified abnormal immunological findings in serum: Secondary | ICD-10-CM

## 2024-08-14 DIAGNOSIS — R0982 Postnasal drip: Secondary | ICD-10-CM

## 2024-08-14 DIAGNOSIS — K219 Gastro-esophageal reflux disease without esophagitis: Secondary | ICD-10-CM | POA: Diagnosis not present

## 2024-08-14 LAB — CBC WITH DIFFERENTIAL/PLATELET
Abs Immature Granulocytes: 0.03 K/uL (ref 0.00–0.07)
Basophils Absolute: 0.1 K/uL (ref 0.0–0.1)
Basophils Relative: 1 %
Eosinophils Absolute: 0.1 K/uL (ref 0.0–0.5)
Eosinophils Relative: 2 %
HCT: 44.1 % (ref 36.0–46.0)
Hemoglobin: 14.9 g/dL (ref 12.0–15.0)
Immature Granulocytes: 0 %
Lymphocytes Relative: 30 %
Lymphs Abs: 2.5 K/uL (ref 0.7–4.0)
MCH: 30.5 pg (ref 26.0–34.0)
MCHC: 33.8 g/dL (ref 30.0–36.0)
MCV: 90.4 fL (ref 80.0–100.0)
Monocytes Absolute: 0.4 K/uL (ref 0.1–1.0)
Monocytes Relative: 5 %
Neutro Abs: 5.2 K/uL (ref 1.7–7.7)
Neutrophils Relative %: 62 %
Platelets: 246 K/uL (ref 150–400)
RBC: 4.88 MIL/uL (ref 3.87–5.11)
RDW: 13.3 % (ref 11.5–15.5)
WBC: 8.3 K/uL (ref 4.0–10.5)
nRBC: 0 % (ref 0.0–0.2)

## 2024-08-14 LAB — NITRIC OXIDE: Nitric Oxide: 9

## 2024-08-14 MED ORDER — FLUTICASONE PROPIONATE HFA 110 MCG/ACT IN AERO
2.0000 | INHALATION_SPRAY | Freq: Two times a day (BID) | RESPIRATORY_TRACT | 6 refills | Status: DC
  Filled 2024-08-14: qty 12, 30d supply, fill #0
  Filled 2024-11-01: qty 12, 30d supply, fill #1

## 2024-08-14 MED ORDER — ESOMEPRAZOLE MAGNESIUM 40 MG PO CPDR
40.0000 mg | DELAYED_RELEASE_CAPSULE | Freq: Every day | ORAL | 2 refills | Status: AC
  Filled 2024-08-14: qty 30, 30d supply, fill #0
  Filled 2024-09-12: qty 30, 30d supply, fill #1

## 2024-08-14 NOTE — Progress Notes (Deleted)
 Subjective:    Patient ID: Barbara Blake, female    DOB: 1956-11-07, 68 y.o.   MRN: [id number]  Patient Care Team: Bair, Kalpana, MD as PCP - General [facility])  Chief Complaint  Patient presents with  . Consult    Cough x several years.     BACKGROUND:   HPI    Review of Systems A 10 point review of systems was performed and it is as noted above otherwise negative.   Past Medical History:  Diagnosis Date  . Anxiety   . Asthma   . Asthma, well controlled 11/28/2017  . Cholelithiasis 04/25/2014  . Fatty liver 01/27/2018  . Gallstones   . History of kidney stones   . HTN (hypertension)   . Hypothyroidism   . Kidney stones 05/29/2018  . Left sided sciatica 04/04/2014  . Low back pain   . Lung nodule   . Obesity   . Rectal polyp   . Vitamin D  deficiency     Past Surgical History:  Procedure Laterality Date  . APPENDECTOMY    . CATARACT EXTRACTION W/PHACO Right 05/13/2022   Procedure: CATARACT EXTRACTION PHACO AND INTRAOCULAR LENS PLACEMENT (IOC) RIGHT RAYNER LENS;  Surgeon: Enola Feliciano Hugger, MD;  Location: [facility];  Service: Ophthalmology;  Laterality: Right;  12.84 1:47.2  . CATARACT EXTRACTION W/PHACO Left 05/27/2022   Procedure: CATARACT EXTRACTION PHACO AND INTRAOCULAR LENS PLACEMENT (IOC) LEFT RAYNER LENS 10.93 01:07.6;  Surgeon: Enola Feliciano Hugger, MD;  Location: [facility];  Service: Ophthalmology;  Laterality: Left;  . COLONOSCOPY N/A 11/27/2021   Procedure: COLONOSCOPY WITH BIOPSY;  Surgeon: Jinny Carmine, MD;  Location: [facility];  Service: Endoscopy;  Laterality: N/A;  . DILATION AND CURETTAGE OF UTERUS    . EXTRACORPOREAL SHOCK WAVE LITHOTRIPSY Left 06/22/2018   Procedure: EXTRACORPOREAL SHOCK WAVE LITHOTRIPSY (ESWL);  Surgeon: Twylla Glendia BROCKS, MD;  Location: [facility];  Service: Urology;  Laterality: Left;  . LIPOMA EXCISION    . MASS EXCISION Right 07/16/2020   Procedure: EXCISION OF A SUBCUTANEOUS SOFT  TISSUE MASS IN THE ANTERIOR ASPECT OF RIGHT KNEE.;  Surgeon: Edie Norleen PARAS, MD;  Location: [facility];  Service: Orthopedics;  Laterality: Right;  . POLYPECTOMY N/A 11/27/2021   Procedure: POLYPECTOMY;  Surgeon: Jinny Carmine, MD;  Location: [facility];  Service: Endoscopy;  Laterality: N/A;    Patient Active Problem List   Diagnosis Date Noted  . Encounter for screening examination for intermediate hyperglycemia and diabetes mellitus 10/07/2023  . Aortic atherosclerosis (HCC) 04/14/2023  . Low vitamin B12 level 04/10/2023  . Postmenopausal estrogen deficiency 12/20/2022  . Atypical squamous cell changes of undetermined significance (ASCUS) on cervical cytology with negative high risk human papilloma virus (HPV) test result 12/16/2021  . Morbid obesity with BMI of 40.0-44.9, adult (HCC) 05/01/2020  . Allergic rhinitis 11/23/2019  . Colon polyps 11/17/2018  . Annual physical exam 11/17/2018  . Pulmonary nodules 06/02/2018  . HLD (hyperlipidemia) 12/16/2017  . Midline low back pain with bilateral sciatica 11/28/2017  . Gastroesophageal reflux disease 11/28/2017  . Hoarseness of voice 11/28/2017  . Asthma, mild intermittent 06/30/2016  . Positive ANA (antinuclear antibody) 02/20/2016  . Vitamin D  deficiency 02/20/2016  . Screen for colon cancer 04/04/2014  . Essential hypertension 11/19/2011  . Hypothyroidism 11/19/2011  . Anxiety 11/19/2011    Family History  Problem Relation Age of Onset  . Alzheimer's disease Mother   . Hypertension Mother   . Cancer Mother 57  colon  . Lupus Daughter   . Heart attack Maternal Grandmother   . Breast cancer Other   . Breast cancer Maternal Aunt 50  . Bladder Cancer Neg Hx   . Kidney cancer Neg Hx     Social History   Tobacco Use  . Smoking status: Never  . Smokeless tobacco: Never  Substance Use Topics  . Alcohol use: No    Alcohol/week: 0.0 standard drinks of alcohol    No Known Allergies  Current Meds  Medication  Sig  . Albuterol -Budesonide  (AIRSUPRA ) 90-80 MCG/ACT AERO Inhale 1 puff into the lungs 4 (four) times daily as needed.  . amLODipine  (NORVASC ) 10 MG tablet Take 1 tablet (10 mg total) by mouth daily.  . Cyanocobalamin  (VITAMIN B-12) 1000 MCG SUBL Place 1 tablet (1,000 mcg total) under the tongue daily.  . hydrochlorothiazide  (HYDRODIURIL ) 25 MG tablet Take 1 tablet (25 mg total) by mouth daily.  SABRA ipratropium (ATROVENT ) 0.06 % nasal spray Place 2 sprays into both nostrils 3 (three) times daily as needed for rhinitis.  . levothyroxine  (SYNTHROID ) 125 MCG tablet Take 1 tablet (125 mcg total) by mouth daily.  . rosuvastatin  (CRESTOR ) 5 MG tablet Take 1 tablet (5 mg total) by mouth daily.    Immunization History  Administered Date(s) Administered  . DT (Pediatric) 12/13/2008  . INFLUENZA, HIGH DOSE SEASONAL PF 09/09/2022  . Influenza Split 10/20/2011, 09/02/2014  . Influenza,inj,Quad PF,6+ Mos 08/16/2019  . Influenza-Unspecified 08/30/2017, 07/21/2018, 08/16/2019, 09/09/2020, 08/31/2021, 09/07/2023  . PFIZER(Purple Top)SARS-COV-2 Vaccination 12/10/2019, 12/31/2019, 11/14/2020  . PPD Test 11/17/2018  . Pfizer Covid-19 Vaccine Bivalent Booster 65yrs & up 12/18/2021  . Pfizer(Comirnaty )Fall Seasonal Vaccine 12 years and older 10/29/2022  . Pneumococcal Polysaccharide-23 07/10/2010, 11/25/2017  . Pneumococcal-Unspecified 08/10/2021  . Tdap 01/11/2020  . Zoster Recombinant(Shingrix) 10/06/2018, 02/16/2019        Objective:     Ht 5' 1 (1.549 m)   BMI 40.70 kg/m      GENERAL: HEAD: Normocephalic, atraumatic.  EYES: Pupils equal, round, reactive to light.  No scleral icterus.  MOUTH:  NECK: Supple. No thyromegaly. Trachea midline. No JVD.  No adenopathy. PULMONARY: Good air entry bilaterally.  No adventitious sounds. CARDIOVASCULAR: S1 and S2. Regular rate and rhythm.  ABDOMEN: MUSCULOSKELETAL: No joint deformity, no clubbing, no edema.  NEUROLOGIC:  SKIN:  Intact,warm,dry. PSYCH:        Assessment & Plan:   No diagnosis found.  No orders of the defined types were placed in this encounter.   No orders of the defined types were placed in this encounter.    Advised if symptoms do not improve or worsen, to please contact office for sooner follow up or seek emergency care.    I spent xxx minutes of dedicated to the care of this patient on the date of this encounter to include pre-visit review of records, face-to-face time with the patient discussing conditions above, post visit ordering of testing, clinical documentation with the electronic health record, making appropriate referrals as documented, and communicating necessary findings to members of the patients care team.   C. Leita Sanders, MD Advanced Bronchoscopy PCCM [facility]    *This note was dictated using voice recognition software/Dragon.  Despite best efforts to proofread, errors can occur which can change the meaning. Any transcriptional errors that result from this process are unintentional and may not be fully corrected at the time of dictation.

## 2024-08-14 NOTE — Patient Instructions (Signed)
 VISIT SUMMARY:  Today, we discussed your chronic cough and throat tickle that you've had for over fifteen years. We reviewed your previous treatments and symptoms, including occasional wheezing and voice loss during conversations.  YOUR PLAN:  -CHRONIC COUGH WITH SUSPECTED COUGH VARIANT ASTHMA: Cough variant asthma is a type of asthma where the main symptom is a chronic cough instead of wheezing. We will conduct pulmonary function tests to confirm this diagnosis. You are prescribed an inhaler to use twice daily, and it's important to rinse your mouth after each use. You can continue using Airsupra  as needed. Avoid mint or menthol products, and try using Lutens lozenges and sipping water  to help manage your cough.  -GASTROESOPHAGEAL REFLUX DISEASE (GERD): GERD is a condition where stomach acid frequently flows back into the tube connecting your mouth and stomach, which can cause a chronic cough. You are prescribed Nexium  to manage GERD, as it may be contributing to your cough variant asthma.  -POSTNASAL DRIP: Postnasal drip occurs when excess mucus from the nose drips down the back of the throat, which can trigger a cough. Since previous treatments have not been effective, you are advised to take Zyrtec at bedtime instead of Allegra.  -INTERMITTENT SHORTNESS OF BREATH: Your occasional shortness of breath may be related to cough variant asthma. We will monitor this symptom as we treat your chronic cough.  INSTRUCTIONS:  Please schedule a follow-up appointment in 4-6 weeks to assess your response to the treatment. Additionally, get a chest x-ray done at your convenience.

## 2024-08-14 NOTE — Progress Notes (Signed)
 Subjective:    Patient ID: Barbara Blake, female    DOB: 1956-10-27, 68 y.o.   MRN: [id number]  Patient Care Team: Bair, Kalpana, MD as PCP - [facility])  Chief Complaint  Patient presents with   Consult    Cough x several years.     BACKGROUND: Patient is a 68 year old lifelong never smoker who presents for evaluation of a chronic cough present for a number of years.  Patient is also noted to have multiple pulmonary nodules deemed benign by prior imaging.  Patient is kindly referred by Dr. Luke Shade.   HPI Discussed the use of AI scribe software for clinical note transcription with the patient, who gave verbal consent to proceed.  History of Present Illness   Barbara Blake is a 68 year old female who presents with a chronic cough and tickle in her throat.  She presents with her husband [name] today.  She has experienced a persistent tickle and cough for over fifteen years, which significantly impacts her ability to hold a normal conversation. During conversations, she sometimes completely loses her voice.  Cough is nonproductive.  No hemoptysis noted.  She has previously consulted with an ENT specialist and her primary care physician. The ENT suggested GERD and prescribed medication for acid reflux, which she took as directed but did not find effective in alleviating her cough. She also tried treatment for postnasal drip, but it did not provide relief.  She experiences occasional wheezing and uses Airsupra  (albuterol ), which provides temporary relief. She does not smoke.   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  No significant occupational exposure otherwise.  She has been noted to have multiple small pulmonary nodules that have been stable since 2020 and considered benign.    She does not endorse reflux symptoms per se.  She does have almost continual throat clearing.  Does note postnasal drip.  No chest pain.  She does have occasional shortness of breath particularly  with exertion.   Review of Systems A 10 point review of systems was performed and it is as noted above otherwise negative.   Past Medical History:  Diagnosis Date   Anxiety    Asthma    Asthma, well controlled 11/28/2017   Cholelithiasis 04/25/2014   Fatty liver 01/27/2018   Gallstones    History of kidney stones    HTN (hypertension)    Hypothyroidism    Kidney stones 05/29/2018   Left sided sciatica 04/04/2014   Low back pain    Lung nodule    Obesity    Rectal polyp    Vitamin D  deficiency     Past Surgical History:  Procedure Laterality Date   APPENDECTOMY     CATARACT EXTRACTION W/PHACO Right 05/13/2022   Procedure: CATARACT EXTRACTION PHACO AND INTRAOCULAR LENS PLACEMENT (IOC) RIGHT RAYNER LENS;  Surgeon: Enola Feliciano Hugger, MD;  Location: [facility];  Service: Ophthalmology;  Laterality: Right;  12.84 1:47.2   CATARACT EXTRACTION W/PHACO Left 05/27/2022   Procedure: CATARACT EXTRACTION PHACO AND INTRAOCULAR LENS PLACEMENT (IOC) LEFT RAYNER LENS 10.93 01:07.6;  Surgeon: Enola Feliciano Hugger, MD;  Location: [facility];  Service: Ophthalmology;  Laterality: Left;   COLONOSCOPY N/A 11/27/2021   Procedure: COLONOSCOPY WITH BIOPSY;  Surgeon: Jinny Carmine, MD;  Location: [facility];  Service: Endoscopy;  Laterality: N/A;   DILATION AND CURETTAGE OF UTERUS     EXTRACORPOREAL SHOCK WAVE LITHOTRIPSY Left 06/22/2018   Procedure: EXTRACORPOREAL SHOCK WAVE LITHOTRIPSY (ESWL);  Surgeon:  Stoioff, Glendia BROCKS, MD;  Location: [facility] OR[facility];  Service: Urology;  Laterality: Left;   LIPOMA EXCISION     MASS EXCISION Right 07/16/2020   Procedure: EXCISION OF A SUBCUTANEOUS SOFT TISSUE MASS IN THE ANTERIOR ASPECT OF RIGHT KNEE.;  Surgeon: Edie Norleen PARAS, MD;  Location: [facility];  Service: Orthopedics;  Laterality: Right;   POLYPECTOMY N/A 11/27/2021   Procedure: POLYPECTOMY;  Surgeon: Jinny Carmine, MD;  Location: [facility];  Service: Endoscopy;  Laterality: N/A;     Patient Active Problem List   Diagnosis Date Noted   Encounter for screening examination for intermediate hyperglycemia and diabetes mellitus 10/07/2023   Aortic atherosclerosis (HCC) 04/14/2023   Low vitamin B12 level 04/10/2023   Postmenopausal estrogen deficiency 12/20/2022   Atypical squamous cell changes of undetermined significance (ASCUS) on cervical cytology with negative high risk human papilloma virus (HPV) test result 12/16/2021   Morbid obesity with BMI of 40.0-44.9, adult (HCC) 05/01/2020   Allergic rhinitis 11/23/2019   Colon polyps 11/17/2018   Annual physical exam 11/17/2018   Pulmonary nodules 06/02/2018   HLD (hyperlipidemia) 12/16/2017   Midline low back pain with bilateral sciatica 11/28/2017   Gastroesophageal reflux disease 11/28/2017   Hoarseness of voice 11/28/2017   Asthma, mild intermittent 06/30/2016   Positive ANA (antinuclear antibody) 02/20/2016   Vitamin D  deficiency 02/20/2016   Screen for colon cancer 04/04/2014   Essential hypertension 11/19/2011   Hypothyroidism 11/19/2011   Anxiety 11/19/2011    Family History  Problem Relation Age of Onset   Alzheimer's disease Mother    Hypertension Mother    Cancer Mother 58       colon   Lupus Daughter    Heart attack Maternal Grandmother    Breast cancer Other    Breast cancer Maternal Aunt 50   Bladder Cancer Neg Hx    Kidney cancer Neg Hx     Social History   Tobacco Use   Smoking status: Never   Smokeless tobacco: Never  Substance Use Topics   Alcohol use: No    Alcohol/week: 0.0 standard drinks of alcohol    No Known Allergies  Current Meds  Medication Sig   Albuterol -Budesonide  (AIRSUPRA ) 90-80 MCG/ACT AERO Inhale 1 puff into the lungs 4 (four) times daily as needed.   amLODipine  (NORVASC ) 10 MG tablet Take 1 tablet (10 mg total) by mouth daily.   Cyanocobalamin  (VITAMIN B-12) 1000 MCG SUBL Place 1 tablet (1,000 mcg total) under the tongue daily.   esomeprazole  (NEXIUM ) 40 MG  capsule Take 1 capsule (40 mg total) by mouth daily.   fluticasone  (FLOVENT  HFA) 110 MCG/ACT inhaler Inhale 2 puffs into the lungs 2 (two) times daily.   hydrochlorothiazide  (HYDRODIURIL ) 25 MG tablet Take 1 tablet (25 mg total) by mouth daily.   ipratropium (ATROVENT ) 0.06 % nasal spray Place 2 sprays into both nostrils 3 (three) times daily as needed for rhinitis.   levothyroxine  (SYNTHROID ) 125 MCG tablet Take 1 tablet (125 mcg total) by mouth daily.   rosuvastatin  (CRESTOR ) 5 MG tablet Take 1 tablet (5 mg total) by mouth daily.    Immunization History  Administered Date(s) Administered   DT (Pediatric) 12/13/2008   INFLUENZA, HIGH DOSE SEASONAL PF 09/09/2022   Influenza Split 10/20/2011, 09/02/2014   Influenza,inj,Quad PF,6+ Mos 08/16/2019   Influenza-Unspecified 08/30/2017, 07/21/2018, 08/16/2019, 09/09/2020, 08/31/2021, 09/07/2023   PFIZER(Purple Top)SARS-COV-2 Vaccination 12/10/2019, 12/31/2019, 11/14/2020   PPD Test 11/17/2018   Pfizer Covid-19 Vaccine Bivalent Booster 75yrs & up 12/18/2021  Pfizer(Comirnaty )Fall Seasonal Vaccine 12 years and older 10/29/2022   Pneumococcal Polysaccharide-23 07/10/2010, 11/25/2017   Pneumococcal-Unspecified 08/10/2021   Tdap 01/11/2020   Zoster Recombinant(Shingrix) 10/06/2018, 02/16/2019        Objective:     BP 120/70   Pulse 78   Temp 98.2 F (36.8 C) (Oral)   Ht 5' 1 (1.549 m)   Wt 218 lb (98.9 kg)   SpO2 100%   BMI 41.19 kg/m   SpO2: 100 %  GENERAL: Obese woman, no acute distress.  Fully ambulatory, no conversational dyspnea.  Constantly clearing throat with occasional nonproductive cough. HEAD: Normocephalic, atraumatic.  EYES: Pupils equal, round, reactive to light.  No scleral icterus.  MOUTH: Dentition intact, oral mucosa moist.  No thrush. NECK: Supple. No thyromegaly. Trachea midline. No JVD.  No adenopathy. PULMONARY: Good air entry bilaterally.  No adventitious sounds. CARDIOVASCULAR: S1 and S2. Regular rate and  rhythm.  No rubs, murmurs or gallops heard. ABDOMEN: Is otherwise benign. MUSCULOSKELETAL: No joint deformity, no clubbing, no edema.  NEUROLOGIC: No overt focal deficit, no gait disturbance, speech is fluent. SKIN: Intact,warm,dry. PSYCH: Mood and behavior normal.  Lab Results  Component Value Date   NITRICOXIDE 9 08/14/2024  *Very low level of nitric oxide  indicating no significant type II inflammation.   Assessment & Plan:     ICD-10-CM   1. Chronic cough  R05.3 Pulmonary function test    DG Chest 2 View    Nitric oxide     2. Shortness of breath  R06.02 Pulmonary function test    DG Chest 2 View    Nitric oxide     3. Post-nasal drip  R09.82     4. Antineutrophil cytoplasmic antibody (ANCA) positive  R76.8 ANCA TITERS    5. Asthma, cough variant  J45.991 CBC with Differential/Platelet    Allergen Panel (27) + IGE     Orders Placed This Encounter  Procedures   DG Chest 2 View    Standing Status:   Future    Expected Date:   08/14/2024    Expiration Date:   08/14/2025    Reason for Exam (SYMPTOM  OR DIAGNOSIS REQUIRED):   Chronic cough    Preferred imaging location?:   [facility]   CBC with Differential/Platelet    Standing Status:   Future    Expected Date:   08/14/2024    Expiration Date:   08/14/2025   Allergen Panel (27) + IGE    Standing Status:   Future    Expiration Date:   08/14/2025   ANCA TITERS    Standing Status:   Future    Expiration Date:   08/14/2025   Nitric oxide    Pulmonary function test    Standing Status:   Future    Expiration Date:   08/14/2025    Where should this test be performed?:   Outpatient Pulmonary    What type of PFT is being ordered?:   Full PFT   Meds ordered this encounter  Medications   esomeprazole  (NEXIUM ) 40 MG capsule    Sig: Take 1 capsule (40 mg total) by mouth daily.    Dispense:  30 capsule    Refill:  2   fluticasone  (FLOVENT  HFA) 110 MCG/ACT inhaler    Sig: Inhale 2 puffs into the lungs 2 (two) times daily.     Dispense:  12 g    Refill:  6   Discussion:    Chronic cough with suspected cough variant asthma Chronic cough for  over 15 years, possibly due to cough variant asthma. Low level of inflammation does not exclude cough variant asthma.  She may have LONA asthma differential includes postnasal drip and GERD as potential triggers. Cough variant asthma can present with cough instead of wheezing. - Order pulmonary function tests - Prescribe inhaler for use twice daily (Flovent  110) - Advise to rinse mouth after inhaler use - Allow continued use of Airsupra  as needed - Advise to avoid mint or menthol products - Recommend using Luden's lozenges and sipping water  to manage cough  Gastroesophageal reflux disease (GERD) GERD previously diagnosed and treated with Nexium , but cough persists. GERD may contribute to cough variant asthma. - Prescribe Nexium  for GERD management - Advised trial of PPI should last more than a few weeks  Postnasal drip Postnasal drip considered as a potential trigger for cough variant asthma. Previous treatments have been ineffective. - Recommend Zyrtec at bedtime instead of Allegra  Intermittent shortness of breath Intermittent shortness of breath possibly related to cough variant asthma. - PFTs ordered  Anti-MPO (ANCA) antibody positivity Previously noted.  Can be seen in eosinophilic granulomatosis with polyangiitis. - Will obtain CBC with differential  Follow-Up Follow-up needed to assess response to treatment and further evaluate chronic cough. - Schedule follow-up appointment in 4-6 weeks - Order chest x-ray to be done at her convenience      Advised if symptoms do not improve or worsen, to please contact office for sooner follow up or seek emergency care.    I spent 60 minutes of dedicated to the care of this patient on the date of this encounter to include pre-visit review of records, face-to-face time with the patient discussing conditions above, post  visit ordering of testing, clinical documentation with the electronic health record, making appropriate referrals as documented, and communicating necessary findings to members of the patients care team.   C. Leita Sanders, MD Advanced [facility]ch[facility]    *This note was dictated using voice recognition software/Dragon.  Despite best efforts to proofread, errors can occur which can change the meaning. Any transcriptional errors that result from this process are unintentional and may not be fully corrected at the time of dictation.

## 2024-08-15 LAB — ALLERGEN PANEL (27) + IGE
Alternaria Alternata IgE: <0.1 kU/L
Aspergillus Fumigatus IgE: <0.1 kU/L
Bahia Grass IgE: <0.1 kU/L
Bermuda Grass IgE: <0.1 kU/L
Cat Dander IgE: 2.99 kU/L — AB
Cedar, Mountain IgE: <0.1 kU/L
Cladosporium Herbarum IgE: <0.1 kU/L
Cocklebur IgE: <0.1 kU/L
Cockroach, American IgE: <0.1 kU/L
Common Silver Birch IgE: <0.1 kU/L
D Farinae IgE: <0.1 kU/L
D Pteronyssinus IgE: 0.11 kU/L — AB
Dog Dander IgE: 0.15 kU/L — AB
Elm, American IgE: <0.1 kU/L
Hickory, White IgE: <0.1 kU/L
IgE (Immunoglobulin E), Serum: 290 [IU]/mL (ref 6–495)
Johnson Grass IgE: <0.1 kU/L
Kentucky Bluegrass IgE: <0.1 kU/L
Maple/Box Elder IgE: <0.1 kU/L
Mucor Racemosus IgE: <0.1 kU/L
Oak, White IgE: <0.1 kU/L
Penicillium Chrysogen IgE: <0.1 kU/L
Pigweed, Rough IgE: <0.1 kU/L
Plantain, English IgE: <0.1 kU/L
Ragweed, Short IgE: <0.1 kU/L
Setomelanomma Rostrat: <0.1 kU/L
Timothy Grass IgE: <0.1 kU/L
White Mulberry IgE: <0.1 kU/L

## 2024-08-15 LAB — ANCA TITERS
Atypical P-ANCA titer: <1:20 {titer}
C-ANCA: <1:20 {titer}
P-ANCA: <1:20 {titer}

## 2024-08-16 ENCOUNTER — Ambulatory Visit: Payer: Self-pay | Admitting: Pulmonary Disease

## 2024-08-28 ENCOUNTER — Other Ambulatory Visit: Payer: Self-pay

## 2024-09-20 ENCOUNTER — Other Ambulatory Visit (INDEPENDENT_AMBULATORY_CARE_PROVIDER_SITE_OTHER)

## 2024-09-20 DIAGNOSIS — E039 Hypothyroidism, unspecified: Secondary | ICD-10-CM | POA: Diagnosis not present

## 2024-09-20 DIAGNOSIS — E559 Vitamin D deficiency, unspecified: Secondary | ICD-10-CM

## 2024-09-20 NOTE — Addendum Note (Signed)
 Addended by: MARYLEN PRO A on: 09/20/2024 02:45 PM   Modules accepted: Orders

## 2024-09-21 LAB — TSH: TSH: 0.556 u[IU]/mL (ref 0.450–4.500)

## 2024-09-21 LAB — VITAMIN D 25 HYDROXY (VIT D DEFICIENCY, FRACTURES): Vit D, 25-Hydroxy: 26.9 ng/mL — ABNORMAL LOW (ref 30.0–100.0)

## 2024-09-21 LAB — T4, FREE: Free T4: 1.38 ng/dL (ref 0.82–1.77)

## 2024-09-21 NOTE — Progress Notes (Signed)
 Hold results. To be discussed during office visit on 09/24/24.   Luke Shade, MD

## 2024-09-22 ENCOUNTER — Other Ambulatory Visit: Payer: Self-pay

## 2024-09-24 ENCOUNTER — Other Ambulatory Visit: Payer: Self-pay

## 2024-09-24 ENCOUNTER — Ambulatory Visit

## 2024-09-24 VITALS — BP 116/68 | HR 77 | Temp 98.2°F | Ht 61.0 in | Wt 222.4 lb

## 2024-09-24 DIAGNOSIS — E66813 Obesity, class 3: Secondary | ICD-10-CM

## 2024-09-24 DIAGNOSIS — E039 Hypothyroidism, unspecified: Secondary | ICD-10-CM

## 2024-09-24 DIAGNOSIS — E559 Vitamin D deficiency, unspecified: Secondary | ICD-10-CM

## 2024-09-24 DIAGNOSIS — I10 Essential (primary) hypertension: Secondary | ICD-10-CM | POA: Diagnosis not present

## 2024-09-24 MED ORDER — TIRZEPATIDE-WEIGHT MANAGEMENT 2.5 MG/0.5ML ~~LOC~~ SOLN
2.5000 mg | SUBCUTANEOUS | 0 refills | Status: DC
  Filled 2024-09-24: qty 28, fill #0

## 2024-09-24 MED ORDER — WEGOVY 0.25 MG/0.5ML ~~LOC~~ SOAJ
0.2500 mg | SUBCUTANEOUS | 0 refills | Status: AC
  Filled 2024-09-24: qty 2, 28d supply, fill #0

## 2024-09-24 MED ORDER — VITAMIN D (ERGOCALCIFEROL) 1.25 MG (50000 UNIT) PO CAPS
50000.0000 [IU] | ORAL_CAPSULE | ORAL | 3 refills | Status: AC
  Filled 2024-09-24: qty 12, 84d supply, fill #0

## 2024-09-24 MED ORDER — AMOXICILLIN-POT CLAVULANATE 875-125 MG PO TABS
1.0000 | ORAL_TABLET | Freq: Two times a day (BID) | ORAL | 0 refills | Status: AC
  Filled 2024-09-24: qty 14, 7d supply, fill #0

## 2024-09-24 NOTE — Patient Instructions (Addendum)
 Take calcium  600 mg, twice a day.  Take vitamin D  50,000 units once weekly.  Continue Synthroid  125 mcg in the morning.  We will start with Zepbound 2.5 mg once weekly for 28 days then we will go on 5 mg once weekly after that. Recommend follow up in 2-3 months for follow up. Take women's daily multivitamin. Please reach out to me if you develop severe nausea, vomiting, belt like abdominal pain, diarrhea that does not resolve.

## 2024-09-24 NOTE — Addendum Note (Signed)
 Addended by: Anab Vivar on: 09/24/2024 05:24 PM   Modules accepted: Orders

## 2024-09-24 NOTE — Assessment & Plan Note (Signed)
 With complications including hyperlipidemia, prediabetes.  Goal weight 190 lbs. Has tried extensive lifestyle modifications with regular moderate intensity exercise, healthy diet without significant weight loss. Discussed pharmacological intervention with medications like Wegovy, Zepbound in adjunct with continuing lifestyle modifications to help with weight loss and reduce risk of cardiovascular co-morbidities.  Patient denies a personal or family history of pancreatitis, medullary thyroid  carcinoma or multiple endocrine neoplasia type II.  Zepbound not covered by insurance.  Recommend starting Wegovy 0.25 mg once weekly for 28 days and increase dose as tolerated.  Patient counseled on medication s/e and recommend she reaches out to clinic if she develops significant nausea, vomiting, belt like abdominal pain.  F/U in 2-3 months.

## 2024-09-24 NOTE — Assessment & Plan Note (Signed)
 Recent TSH and T4 level from 09/20/24 shows normalization. Continue Levothyroxine  125 mcg daily on an empty stomach. F/U in 2-3 months.

## 2024-09-24 NOTE — Progress Notes (Signed)
 Results discussed during office visit on 09/24/24.   Luke Shade, MD

## 2024-09-24 NOTE — Assessment & Plan Note (Signed)
 Reviewed recent vitamin D  level, continues to have vitamin D  deficiency. Start vitamin D  50,000 units once weekly and calcium  600 mg twice a day. She can get calcium  supplement over the counter.

## 2024-09-24 NOTE — Assessment & Plan Note (Signed)
 BP within goal today, continue hydrochlorothiazide  25 mg daily and Amlodipine  10 mg daily.

## 2024-09-24 NOTE — Progress Notes (Addendum)
 Established Patient Office Visit   Subjective  Patient ID: Barbara Blake, female    DOB: [birth year]  Age: 68 y.o. MRN: [id number]  Chief Complaint  Patient presents with   Weight Check    She  has a past medical history of Anxiety, Asthma, Asthma, well controlled (11/28/2017), Cholelithiasis (04/25/2014), Fatty liver (01/27/2018), Gallstones, History of kidney stones, HTN (hypertension), Hypothyroidism, Kidney stones (05/29/2018), Left sided sciatica (04/04/2014), Low back pain, Low vitamin B12 level (04/10/2023), Lung nodule, Obesity, Rectal polyp, and Vitamin D  deficiency.  HPI Discussed the use of AI scribe software for clinical note transcription with the patient, who gave verbal consent to proceed.  History of Present Illness Barbara Blake is a 68 year old female who presents to discuss recent lab results and follow up on potential pharmacological intervention for weight loss.   She wants assistance with weight loss and mentions a pattern of losing and regaining weight. She has not tried weight loss medications in the past and is interested in exploring options that may also help with her prediabetes. She was last seen by me on 08/08/24 and was recommended to start extensive lifestyle modifications including regular exercise and healthy eating. Despite continuing with moderate intensity exercise, healthy diet she has struggled with weight loss.   She has a toothache, just saw her dentist who started her on Augmentin .    Her vitamin D  deficiency is being monitored, with recent lab results showing a level of 26.9 ng/mL. She experiences fatigue.  Her hypothyroidism is managed with Synthroid , currently at a dose of 125 mcg daily. She recalls a previous dose adjustment from 150 mcg. She continues to take her thyroid  medication in the morning, half an hour before other medications.  She has a history of asthma, managed with Fluticasone  inhaler used twice daily, and takes Esomeprazole  40  mg daily for acid reflux.    ROS As per HPI    Objective:     BP 116/68 (BP Location: Right Arm, Patient Position: Sitting, Cuff Size: Normal)   Pulse 77   Temp 98.2 F (36.8 C) (Oral)   Ht 5' 1 (1.549 m)   Wt 222 lb 6.4 oz (100.9 kg)   SpO2 96%   BMI 42.02 kg/m      09/24/2024    4:05 PM 08/08/2024    2:06 PM 05/04/2024    1:13 PM  Depression screen PHQ 2/9  Decreased Interest 0 0 0  Down, Depressed, Hopeless 0 0 0  PHQ - 2 Score 0 0 0  Altered sleeping 0 0 0  Tired, decreased energy 0 0 0  Change in appetite 0 0 0  Feeling bad or failure about yourself  0 0 0  Trouble concentrating 0 0 0  Moving slowly or fidgety/restless 0 0 0  Suicidal thoughts 0 0 0  PHQ-9 Score 0 0 0  Difficult doing work/chores Not difficult at all Not difficult at all Not difficult at all      09/24/2024    4:05 PM 08/08/2024    2:07 PM 05/04/2024    1:13 PM 11/04/2023    9:11 AM  GAD 7 : Generalized Anxiety Score  Nervous, Anxious, on Edge 0 0 0 0  Control/stop worrying 0 0 0 0  Worry too much - different things 0 0 0 0  Trouble relaxing 0 0 0 0  Restless 0 0 0 0  Easily annoyed or irritable 0 0 0 0  Afraid - awful might happen 0  0 0 0  Total GAD 7 Score 0 0 0 0  Anxiety Difficulty Not difficult at all Not difficult at all Not difficult at all Not difficult at all      09/24/2024    4:05 PM 08/08/2024    2:06 PM 05/04/2024    1:13 PM  Depression screen PHQ 2/9  Decreased Interest 0 0 0  Down, Depressed, Hopeless 0 0 0  PHQ - 2 Score 0 0 0  Altered sleeping 0 0 0  Tired, decreased energy 0 0 0  Change in appetite 0 0 0  Feeling bad or failure about yourself  0 0 0  Trouble concentrating 0 0 0  Moving slowly or fidgety/restless 0 0 0  Suicidal thoughts 0 0 0  PHQ-9 Score 0 0 0  Difficult doing work/chores Not difficult at all Not difficult at all Not difficult at all      09/24/2024    4:05 PM 08/08/2024    2:07 PM 05/04/2024    1:13 PM 11/04/2023    9:11 AM  GAD 7 :  Generalized Anxiety Score  Nervous, Anxious, on Edge 0 0 0 0  Control/stop worrying 0 0 0 0  Worry too much - different things 0 0 0 0  Trouble relaxing 0 0 0 0  Restless 0 0 0 0  Easily annoyed or irritable 0 0 0 0  Afraid - awful might happen 0 0 0 0  Total GAD 7 Score 0 0 0 0  Anxiety Difficulty Not difficult at all Not difficult at all Not difficult at all Not difficult at all   SDOH Screenings   Depression (PHQ2-9): Low Risk  (09/24/2024)  Tobacco Use: Low Risk  (09/24/2024)     Physical Exam Constitutional:      General: She is not in acute distress.    Appearance: She is obese.  HENT:     Head: Normocephalic and atraumatic.     Right Ear: Tympanic membrane normal.     Left Ear: Tympanic membrane normal.     Mouth/Throat:     Mouth: Mucous membranes are moist.  Cardiovascular:     Rate and Rhythm: Normal rate.  Pulmonary:     Effort: Pulmonary effort is normal.     Breath sounds: Normal breath sounds.  Abdominal:     General: Abdomen is protuberant.     Palpations: Abdomen is soft.     Tenderness: There is no abdominal tenderness. There is no guarding.  Musculoskeletal:     Comments: Non-pitting edema of b/l lower legs without erythema, sign of cellulitis, ulcers suggestive of lymphedema.   Lymphadenopathy:     Cervical: No cervical adenopathy.  Skin:    General: Skin is warm.  Neurological:     Mental Status: She is alert and oriented to person, place, and time.  Psychiatric:        Mood and Affect: Mood normal.        No results found for any visits on 09/24/24.  The 10-year ASCVD risk score (Arnett DK, et al., 2019) is: 6.4%    Following lab results reviewed during today's visit:  Component     Latest Ref Rng 09/20/2024  TSH     0.450 - 4.500 uIU/mL 0.556   T4,Free(Direct)     0.82 - 1.77 ng/dL 8.61   Vitamin D , 25-Hydroxy     30.0 - 100.0 ng/mL 26.9 (L)      Assessment & Plan:   Obesity, Class III,  BMI 40-49.9 (morbid obesity)  (HCC) Assessment & Plan: With complications including hyperlipidemia, prediabetes.  Goal weight 190 lbs. Has tried extensive lifestyle modifications with regular moderate intensity exercise, healthy diet without significant weight loss. Discussed pharmacological intervention with medications like Wegovy, Zepbound in adjunct with continuing lifestyle modifications to help with weight loss and reduce risk of cardiovascular co-morbidities.  Patient denies a personal or family history of pancreatitis, medullary thyroid  carcinoma or multiple endocrine neoplasia type II.  Zepbound not covered by insurance.  Recommend starting Wegovy 0.25 mg once weekly for 28 days and increase dose as tolerated.  Patient counseled on medication s/e and recommend she reaches out to clinic if she develops significant nausea, vomiting, belt like abdominal pain.  F/U in 2-3 months.   Orders: -     Wegovy; Inject 0.25 mg into the skin once a week for 28 days.  Dispense: 2 mL; Refill: 0  Vitamin D  deficiency Assessment & Plan: Reviewed recent vitamin D  level, continues to have vitamin D  deficiency. Start vitamin D  50,000 units once weekly and calcium  600 mg twice a day. She can get calcium  supplement over the counter.   Orders: -     Vitamin D  (Ergocalciferol ); Take 1 capsule (50,000 Units total) by mouth every 7 (seven) days.  Dispense: 12 capsule; Refill: 3 -     VITAMIN D  25 Hydroxy (Vit-D Deficiency, Fractures); Future  Essential hypertension Assessment & Plan: BP within goal today, continue hydrochlorothiazide  25 mg daily and Amlodipine  10 mg daily.    Acquired hypothyroidism Assessment & Plan: Recent TSH and T4 level from 09/20/24 shows normalization. Continue Levothyroxine  125 mcg daily on an empty stomach. F/U in 2-3 months.   Orders: -     TSH; Future    Return in about 3 months (around 12/25/2024) for Weight check, Zepbound, thyroid , vitamin d  low f/u .   Luke Shade,  MD  ---------------------------------------------------------------------------------------------------------------------------------------------------------------------------------------------  After patient left appointment I reached out to [facility] outpatient pharmacist. Unfortunately, [facility] does not cover for GLP-1 and or GLP-1/GIP agonist for weight loss. Communicated this with the patient. Requesting pharmacist consult, input on medication assistance. Called the patient on the phone and updated her on above finding and recommendations.  Patient agrees referral made to pharmacist.   Special Ranes, MD

## 2024-09-26 ENCOUNTER — Telehealth: Payer: Self-pay

## 2024-09-26 NOTE — Progress Notes (Signed)
 Complex Care Management Note  Care Guide Note 09/26/2024 Name: TAHLIA DEAMER MRN: [id number] DOB: 01-29-1956  EULETA BELSON is a 68 y.o. year old female who sees Bair, Luke, MD for primary care. I reached out to Hadassah FORBES [facility]ort by phone today to offer complex care management services.  Ms. Tomey was given information about Complex Care Management services today including:   The Complex Care Management services include support from the care team which includes your Nurse Care Manager, Clinical Social Worker, or Pharmacist.  The Complex Care Management team is here to help remove barriers to the health concerns and goals most important to you. Complex Care Management services are voluntary, and the patient may decline or stop services at any time by request to their care team member.   Complex Care Management Consent Status: Patient agreed to services and verbal consent obtained.   Follow up plan:  Telephone appointment with complex care management team member scheduled for:  10/02/24 at 3:00 p.m.   Encounter Outcome:  Patient Scheduled  Dream[facility]  [facility], [facility] VBCI Assistant Direct Dial: [phone]  Fax: [phone]

## 2024-09-28 ENCOUNTER — Ambulatory Visit: Admitting: Pulmonary Disease

## 2024-09-28 ENCOUNTER — Encounter: Payer: Self-pay | Admitting: Pulmonary Disease

## 2024-09-28 ENCOUNTER — Ambulatory Visit

## 2024-09-28 VITALS — BP 126/76 | HR 74 | Temp 97.7°F | Ht 61.0 in | Wt 217.4 lb

## 2024-09-28 DIAGNOSIS — J45991 Cough variant asthma: Secondary | ICD-10-CM | POA: Diagnosis not present

## 2024-09-28 DIAGNOSIS — K219 Gastro-esophageal reflux disease without esophagitis: Secondary | ICD-10-CM | POA: Diagnosis not present

## 2024-09-28 DIAGNOSIS — R053 Chronic cough: Secondary | ICD-10-CM

## 2024-09-28 DIAGNOSIS — R0602 Shortness of breath: Secondary | ICD-10-CM

## 2024-09-28 DIAGNOSIS — J3089 Other allergic rhinitis: Secondary | ICD-10-CM

## 2024-09-28 LAB — PULMONARY FUNCTION TEST
DL/VA % pred: 111 %
DL/VA: 4.77 ml/min/mmHg/L
DLCO unc % pred: 105 %
DLCO unc: 18.58 ml/min/mmHg
FEF 25-75 Post: 3 L/s
FEF 25-75 Pre: 2.8 L/s
FEF2575-%Change-Post: 7 %
FEF2575-%Pred-Post: 160 %
FEF2575-%Pred-Pre: 149 %
FEV1-%Change-Post: 0 %
FEV1-%Pred-Post: 114 %
FEV1-%Pred-Pre: 113 %
FEV1-Post: 2.38 L
FEV1-Pre: 2.36 L
FEV1FVC-%Change-Post: 3 %
FEV1FVC-%Pred-Pre: 110 %
FEV6-%Change-Post: -2 %
FEV6-%Pred-Post: 103 %
FEV6-%Pred-Pre: 106 %
FEV6-Post: 2.72 L
FEV6-Pre: 2.79 L
FEV6FVC-%Pred-Post: 104 %
FEV6FVC-%Pred-Pre: 104 %
FVC-%Change-Post: -2 %
FVC-%Pred-Post: 99 %
FVC-%Pred-Pre: 102 %
FVC-Post: 2.72 L
FVC-Pre: 2.8 L
Post FEV1/FVC ratio: 88 %
Post FEV6/FVC ratio: 100 %
Pre FEV1/FVC ratio: 84 %
Pre FEV6/FVC Ratio: 100 %
RV % pred: 67 %
RV: 1.34 L
TLC % pred: 90 %
TLC: 4.15 L

## 2024-09-28 NOTE — Progress Notes (Signed)
 Full PFT completed today ? ?

## 2024-09-28 NOTE — Progress Notes (Unsigned)
 Subjective:    Patient ID: Barbara Blake, female    DOB: 1956/05/04, 68 y.o.   MRN: [id number]  Patient Care Team: Bair, Kalpana, MD as PCP - General (Family Medicine)  Chief Complaint  Patient presents with   Medical Management of Chronic Issues    BACKGROUND/INTERVAL:Patient is a 68 year old lifelong never smoker who presents for follow-up of a chronic cough present for a number of years. Patient is also noted to have multiple pulmonary nodules deemed benign by prior imaging.  She was initially evaluated on 14 August 2024.  She had PFTs today.  HPI Discussed the use of AI scribe software for clinical note transcription with the patient, who gave verbal consent to proceed.  History of Present Illness   Barbara Blake is a 68 year old female with cough variant asthma who presents for follow-up of her condition.  She is accompanied by her husband Barbara Blake.  Her cough has improved significantly and is much better than before. She is currently using Flovent  at a dose of two puffs twice a day. She is also taking Nexium  daily for GERD symptoms.  She has known allergies, particularly to cats, currently does not have cats in the home. She also has minimal sensitivities to dogs and dust mites. Her eosinophil count is not elevated.     Pulmonary function testing performed today was normal.  The patient was reassured that her lung function is good.  Review of Systems A 10 point review of systems was performed and it is as noted above otherwise negative.   Patient Active Problem List   Diagnosis Date Noted   Encounter for screening examination for intermediate hyperglycemia and diabetes mellitus 10/07/2023   Aortic atherosclerosis 04/14/2023   Postmenopausal estrogen deficiency 12/20/2022   Atypical squamous cell changes of undetermined significance (ASCUS) on cervical cytology with negative high risk human papilloma virus (HPV) test result 12/16/2021   Morbid obesity with BMI of  40.0-44.9, adult (HCC) 05/01/2020   Allergic rhinitis 11/23/2019   Colon polyps 11/17/2018   Annual physical exam 11/17/2018   Pulmonary nodules 06/02/2018   HLD (hyperlipidemia) 12/16/2017   Midline low back pain with bilateral sciatica 11/28/2017   Gastroesophageal reflux disease 11/28/2017   Hoarseness of voice 11/28/2017   Asthma, mild intermittent 06/30/2016   Positive ANA (antinuclear antibody) 02/20/2016   Vitamin D  deficiency 02/20/2016   Essential hypertension 11/19/2011   Hypothyroidism 11/19/2011   Anxiety 11/19/2011    Social History   Tobacco Use   Smoking status: Never   Smokeless tobacco: Never  Substance Use Topics   Alcohol use: No    Alcohol/week: 0.0 standard drinks of alcohol    No Known Allergies  Current Meds  Medication Sig   Albuterol -Budesonide  (AIRSUPRA ) 90-80 MCG/ACT AERO Inhale 1 puff into the lungs 4 (four) times daily as needed.   amLODipine  (NORVASC ) 10 MG tablet Take 1 tablet (10 mg total) by mouth daily.   amoxicillin -clavulanate (AUGMENTIN ) 875-125 MG tablet Take 1 tablet by mouth 2 (two) times daily for 7 days.   Cyanocobalamin  (VITAMIN B-12) 1000 MCG SUBL Place 1 tablet (1,000 mcg total) under the tongue daily.   esomeprazole  (NEXIUM ) 40 MG capsule Take 1 capsule (40 mg total) by mouth daily.   fluticasone  (FLOVENT  HFA) 110 MCG/ACT inhaler Inhale 2 puffs into the lungs 2 (two) times daily.   hydrochlorothiazide  (HYDRODIURIL ) 25 MG tablet Take 1 tablet (25 mg total) by mouth daily.   ipratropium (ATROVENT ) 0.06 % nasal spray Place 2 sprays  into both nostrils 3 (three) times daily as needed for rhinitis.   levothyroxine  (SYNTHROID ) 125 MCG tablet Take 1 tablet (125 mcg total) by mouth daily.   rosuvastatin  (CRESTOR ) 5 MG tablet Take 1 tablet (5 mg total) by mouth daily.   Vitamin D , Ergocalciferol , (DRISDOL ) 1.25 MG (50000 UNIT) CAPS capsule Take 1 capsule (50,000 Units total) by mouth every 7 (seven) days.    Immunization History   Administered Date(s) Administered   DT (Pediatric) 12/13/2008   INFLUENZA, HIGH DOSE SEASONAL PF 09/09/2022   Influenza Split 10/20/2011, 09/02/2014   Influenza,inj,Quad PF,6+ Mos 08/16/2019   Influenza-Unspecified 08/30/2017, 07/21/2018, 08/16/2019, 09/09/2020, 08/31/2021, 09/07/2023   PFIZER(Purple Top)SARS-COV-2 Vaccination 12/10/2019, 12/31/2019, 11/14/2020   PPD Test 11/17/2018   Pfizer Covid-19 Vaccine Bivalent Booster 64yrs & up 12/18/2021   Pfizer(Comirnaty )Fall Seasonal Vaccine 12 years and older 10/29/2022   Pneumococcal Polysaccharide-23 07/10/2010, 11/25/2017   Pneumococcal-Unspecified 08/10/2021   Tdap 01/11/2020   Zoster Recombinant(Shingrix) 10/06/2018, 02/16/2019        Objective:     BP 126/76   Pulse 74   Temp 97.7 F (36.5 C) (Temporal)   Ht 5' 1 (1.549 m)   Wt 217 lb 6.4 oz (98.6 kg)   SpO2 100%   BMI 41.08 kg/m   SpO2: 100 %  GENERAL: Obese woman, no acute distress.  Fully ambulatory, no conversational dyspnea.  Constantly clearing throat with occasional nonproductive cough. HEAD: Normocephalic, atraumatic.  EYES: Pupils equal, round, reactive to light.  No scleral icterus.  MOUTH: Dentition intact, oral mucosa moist.  No thrush. NECK: Supple. No thyromegaly. Trachea midline. No JVD.  No adenopathy. PULMONARY: Good air entry bilaterally.  No adventitious sounds. CARDIOVASCULAR: S1 and S2. Regular rate and rhythm.  No rubs, murmurs or gallops heard. ABDOMEN: Is otherwise benign. MUSCULOSKELETAL: No joint deformity, no clubbing, no edema.  NEUROLOGIC: No overt focal deficit, no gait disturbance, speech is fluent. SKIN: Intact,warm,dry. PSYCH: Mood and behavior normal.  Recent Results (from the past 2160 hours)  TSH     Status: Abnormal   Collection Time: 08/07/24  3:58 PM  Result Value Ref Range   TSH 0.03 (L) 0.35 - 5.50 uIU/mL  HgB A1c     Status: None   Collection Time: 08/08/24  2:43 PM  Result Value Ref Range   Hgb A1c MFr Bld 5.8 4.6 -  6.5 %    Comment: Glycemic Control Guidelines for People with Diabetes:Non Diabetic:  <6%Goal of Therapy: <7%Additional Action Suggested:  >8%   B12     Status: None   Collection Time: 08/08/24  2:43 PM  Result Value Ref Range   Vitamin B-12 906 211 - 911 pg/mL  Vitamin D  (25 hydroxy)     Status: None   Collection Time: 08/08/24  2:43 PM  Result Value Ref Range   VITD 46.94 30.00 - 100.00 ng/mL  Comp Met (CMET)     Status: Abnormal   Collection Time: 08/08/24  2:43 PM  Result Value Ref Range   Sodium 141 135 - 145 mEq/L   Potassium 3.9 3.5 - 5.1 mEq/L   Chloride 99 96 - 112 mEq/L   CO2 32 19 - 32 mEq/L   Glucose, Bld 99 70 - 99 mg/dL   BUN 19 6 - 23 mg/dL   Creatinine, Ser 9.20 0.40 - 1.20 mg/dL   Total Bilirubin 1.7 (H) 0.2 - 1.2 mg/dL   Alkaline Phosphatase 89 39 - 117 U/L   AST 20 0 - 37 U/L   ALT  27 0 - 35 U/L   Total Protein 7.0 6.0 - 8.3 g/dL   Albumin 4.0 3.5 - 5.2 g/dL   GFR 22.36 >39.99 mL/min    Comment: Calculated using the CKD-EPI Creatinine Equation (2021)   Calcium  9.6 8.4 - 10.5 mg/dL  Nitric oxide      Status: None   Collection Time: 08/14/24  8:55 AM  Result Value Ref Range   Nitric Oxide  9   CBC with Differential/Platelet     Status: None   Collection Time: 08/14/24  2:05 PM  Result Value Ref Range   WBC 8.3 4.0 - 10.5 K/uL   RBC 4.88 3.87 - 5.11 MIL/uL   Hemoglobin 14.9 12.0 - 15.0 g/dL   HCT 55.8 63.9 - 53.9 %   MCV 90.4 80.0 - 100.0 fL   MCH 30.5 26.0 - 34.0 pg   MCHC 33.8 30.0 - 36.0 g/dL   RDW 86.6 88.4 - 84.4 %   Platelets 246 150 - 400 K/uL   nRBC 0.0 0.0 - 0.2 %   Neutrophils Relative % 62 %   Neutro Abs 5.2 1.7 - 7.7 K/uL   Lymphocytes Relative 30 %   Lymphs Abs 2.5 0.7 - 4.0 K/uL   Monocytes Relative 5 %   Monocytes Absolute 0.4 0.1 - 1.0 K/uL   Eosinophils Relative 2 %   Eosinophils Absolute 0.1 0.0 - 0.5 K/uL   Basophils Relative 1 %   Basophils Absolute 0.1 0.0 - 0.1 K/uL   Immature Granulocytes 0 %   Abs Immature Granulocytes  0.03 0.00 - 0.07 K/uL    Comment: Performed at [facility], [address] [place], [place] [address]  Allergen Panel (27) + IGE     Status: Abnormal   Collection Time: 08/14/24  2:05 PM  Result Value Ref Range   Class Description Allergens Comment     Comment: (NOTE)    Levels of Specific IgE       Class  Description of Class    ---------------------------  -----  --------------------                   < 0.10         0         Negative           0.10 -    0.31         0/I       Equivocal/Low           0.32 -    0.55         I         Low           0.56 -    1.40         II        Moderate           1.41 -    3.90         III       High           3.91 -   19.00         IV        Very High          19.01 -  100.00         V         Very High                  >  100.00         VI        Very High    IgE (Immunoglobulin E), Serum 290 6 - 495 IU/mL   D Pteronyssinus IgE 0.11 (A) Class 0/I kU/L   D Farinae IgE <0.10 Class 0 kU/L   Cat Dander IgE 2.99 (A) Class III kU/L   Dog Dander IgE 0.15 (A) Class 0/I kU/L   [place] Grass IgE <0.10 Class 0 kU/L   Timothy Grass IgE <0.10 Class 0 kU/L   [place]  Bluegrass IgE <0.10 Class 0 kU/L   Johnson Grass IgE <0.10 Class 0 kU/L   Bahia Grass IgE <0.10 Class 0 kU/L   Cockroach, American IgE <0.10 Class 0 kU/L    Comment: (NOTE) This test was developed and its performance characteristics determined by [facility].  It has not been cleared or approved by the U.S. Food and Drug Administration. The FDA has determined that such clearance or approval is not necessary. This test is used for clinical purposes.  It should not be regarded as investigational or for research.    Penicillium Chrysogen IgE <0.10 Class 0 kU/L   Cladosporium Herbarum IgE <0.10 Class 0 kU/L   Aspergillus Fumigatus IgE <0.10 Class 0 kU/L   Mucor Racemosus IgE <0.10 Class 0 kU/L   Alternaria Alternata IgE <0.10 Class 0 kU/L   Setomelanomma Rostrat <0.10 Class 0 kU/L    Oak, White IgE <0.10 Class 0 kU/L   Elm, American IgE <0.10 Class 0 kU/L   Maple/Box Elder IgE <0.10 Class 0 kU/L   Common Silver Valrie IgE <0.10 Class 0 kU/L   Hickory, White IgE <0.10 Class 0 kU/L    Comment: (NOTE) This test was developed and its performance characteristics determined by [facility].  It has not been cleared or approved by the U.S. Food and Drug Administration. The FDA has determined that such clearance or approval is not necessary. This test is used for clinical purposes.  It should not be regarded as investigational or for research.    White Mulberry IgE <0.10 Class 0 kU/L   Cedar, [place] IgE <0.10 Class 0 kU/L   Ragweed, Short IgE <0.10 Class 0 kU/L   Plantain, English IgE <0.10 Class 0 kU/L   Cocklebur IgE <0.10 Class 0 kU/L   Pigweed, Rough IgE <0.10 Class 0 kU/L    Comment: (NOTE) Performed At: [facility] [address] [place], [place] [id number] Jennette Shorter MD Ey:[id number]   ANCA TITERS     Status: None   Collection Time: 08/14/24  2:05 PM  Result Value Ref Range   C-ANCA <1:20 Neg:<1:20 titer   P-ANCA <1:20 Neg:<1:20 titer    Comment: (NOTE) The presence of positive fluorescence exhibiting P-ANCA or C-ANCA patterns alone is not specific for the diagnosis of Wegener's Granulomatosis (WG) or microscopic polyangiitis. Decisions about treatment should not be based solely on ANCA IFA results.  The International ANCA Group Consensus recommends follow up testing of positive sera with both PR-3 and MPO-ANCA enzyme immunoassays. As many as 5% serum samples are positive only by EIA. Ref. AM J Clin Pathol 1999;111:507-513.    Atypical P-ANCA titer <1:20 Neg:<1:20 titer    Comment: (NOTE) The atypical pANCA pattern has been observed in a significant percentage of patients with ulcerative colitis, primary sclerosing cholangitis and autoimmune hepatitis. Performed At: [facility] [address] [place], [place] [id number] Jennette Shorter MD Ey:[id number]   TSH     Status: None   Collection Time: 09/20/24  2:46 PM  Result Value Ref Range   TSH 0.556 0.450 - 4.500 uIU/mL  T4, free     Status: None   Collection Time: 09/20/24  2:46 PM  Result Value Ref Range   Free T4 1.38 0.82 - 1.77 ng/dL  Vitamin D  (25 hydroxy)     Status: Abnormal   Collection Time: 09/20/24  2:46 PM  Result Value Ref Range   Vit D, 25-Hydroxy 26.9 (L) 30.0 - 100.0 ng/mL    Comment: Vitamin D  deficiency has been defined by the Institute of Medicine and an Endocrine Society practice guideline as a level of serum 25-OH vitamin D  less than 20 ng/mL (1,2). The Endocrine Society went on to further define vitamin D  insufficiency as a level between 21 and 29 ng/mL (2). 1. IOM (Institute of Medicine). 2010. Dietary reference    intakes for calcium  and D. [place]: The    [employer]. 2. Holick MF, Binkley [place], Bischoff-Ferrari HA, et al.    Evaluation, treatment, and prevention of vitamin D     deficiency: an Endocrine Society clinical practice    guideline. JCEM. 2011 Jul; 96(7):1911-30.   Pulmonary function test     Status: None   Collection Time: 09/28/24 10:36 AM  Result Value Ref Range   FVC-Pre 2.80 L   FVC-%Pred-Pre 102 %   FVC-Post 2.72 L   FVC-%Pred-Post 99 %   FVC-%Change-Post -2 %   FEV1-Pre 2.36 L   FEV1-%Pred-Pre 113 %   FEV1-Post 2.38 L   FEV1-%Pred-Post 114 %   FEV1-%Change-Post 0 %   FEV6-Pre 2.79 L   FEV6-%Pred-Pre 106 %   FEV6-Post 2.72 L   FEV6-%Pred-Post 103 %   FEV6-%Change-Post -2 %   Pre FEV1/FVC ratio 84 %   FEV1FVC-%Pred-Pre 110 %   Post FEV1/FVC ratio 88 %   FEV1FVC-%Change-Post 3 %   Pre FEV6/FVC Ratio 100 %   FEV6FVC-%Pred-Pre 104 %   Post FEV6/FVC ratio 100 %   FEV6FVC-%Pred-Post 104 %   FEF 25-75 Pre 2.80 L/sec   FEF2575-%Pred-Pre 149 %   FEF 25-75 Post 3.00 L/sec   FEF2575-%Pred-Post 160 %   FEF2575-%Change-Post 7 %   RV 1.34 L   RV % pred 67 %   TLC 4.15 L   TLC % pred 90 %    DLCO unc 18.58 ml/min/mmHg   DLCO unc % pred 105 %   DL/VA 5.22 ml/min/mmHg/L   DL/VA % pred 888 %   Discussed PFTs with patient.   Assessment & Plan:     ICD-10-CM   1. Asthma, cough variant  J45.991     2. Chronic cough  R05.3    IMPROVED    3. Gastroesophageal reflux disease, unspecified whether esophagitis present  K21.9      Discussion:    Cough variant asthma Significant improvement in cough symptoms. Lung function tests show excellent capacity with minor ERV decrease likely due to weight. No significant eosinophilia. Current management with Flovent  is effective. - Continue Flovent  two puffs twice a day. - Will reassess in three months to evaluate the possibility of reducing Flovent  dosage. - Will consider using Airsupra  as needed in the future.  Allergic rhinitis due to cat dander, dust mites, and minimal dog sensitivity Allergic rhinitis primarily triggered by cat dander, with minimal sensitivity to dogs and dust mites. No high eosinophil count, indicating mild allergic response. - Continue current management and monitor symptoms. - Follow avoidance measures.  Gastroesophageal reflux disease (GERD) GERD may contribute to  cough symptoms. Current management with Nexium  is appropriate to control reflux and prevent exacerbation of asthma symptoms. - Continue Nexium  daily.     Will see the patient in follow-up in 3 months time she is to contact us  prior to that time should any new difficulties arise.  Advised if symptoms do not improve or worsen, to please contact office for sooner follow up or seek emergency care.    I spent 20 minutes of dedicated to the care of this patient on the date of this encounter to include pre-visit review of records, face-to-face time with the patient discussing conditions above, post visit ordering of testing, clinical documentation with the electronic health record, making appropriate referrals as documented, and communicating necessary  findings to members of the patients care team.     C. Leita Sanders, MD Advanced Bronchoscopy PCCM [facility]    *This note was generated using voice recognition software/Dragon and/or AI transcription program.  Despite best efforts to proofread, errors can occur which can change the meaning. Any transcriptional errors that result from this process are unintentional and may not be fully corrected at the time of dictation.

## 2024-09-28 NOTE — Patient Instructions (Signed)
 Full PFT completed today ? ?

## 2024-09-28 NOTE — Patient Instructions (Signed)
 VISIT SUMMARY:  Barbara Blake, you had a follow-up appointment today to check on your cough variant asthma. Your cough has improved significantly, and your lung function tests show excellent capacity with minor restriction likely due to weight. We also reviewed your allergic rhinitis and gastroesophageal reflux disease (GERD).  YOUR PLAN:  -COUGH VARIANT ASTHMA: Cough variant asthma is a type of asthma where the main symptom is a dry, non-productive cough. Your symptoms have improved significantly, and your lung function tests are excellent. Continue using Flovent  at a dose of two puffs twice a day. We will reassess in three months to see if we can reduce the dosage. We may consider using Airsupra  as needed in the future.  -ALLERGIC RHINITIS DUE TO CAT DANDER, DUST MITES, AND MINIMAL DOG DANDER SENSITIVITY: Allergic rhinitis is an allergic reaction that causes sneezing, congestion, and a runny nose. Your symptoms are primarily triggered by cat dander, with minimal sensitivity to dogs and dust mites. Continue your current management and monitor your symptoms.  -GASTROESOPHAGEAL REFLUX DISEASE (GERD): GERD is a condition where stomach acid frequently flows back into the tube connecting your mouth and stomach, which can contribute to cough symptoms. Continue taking Nexium  daily to control reflux and prevent it from worsening your asthma symptoms.  INSTRUCTIONS:  Please continue with your current medications: Flovent  two puffs twice a day for your asthma and Nexium  daily for GERD. We will reassess your asthma in three months to evaluate the possibility of reducing your Flovent  dosage. If you have any new or worsening symptoms, please contact our office.

## 2024-10-02 ENCOUNTER — Other Ambulatory Visit (INDEPENDENT_AMBULATORY_CARE_PROVIDER_SITE_OTHER): Admitting: Pharmacist

## 2024-10-02 DIAGNOSIS — Z6841 Body Mass Index (BMI) 40.0 and over, adult: Secondary | ICD-10-CM

## 2024-10-02 NOTE — Progress Notes (Signed)
   10/02/2024 Name: Barbara Blake MRN: [id number] DOB: 1956-06-24  Subjective  Chief Complaint  Patient presents with   Obesity   Care Team: Primary Care Provider: Bair, Kalpana, MD Doc of the Day: Allena Hamilton, MD   Reason for visit: ?  Barbara Blake is a 68 y.o. female who presents today for a telephone visit with the pharmacist due to medication access concerns. [place] plan, PCP would like to add incretin mimetic is possible.    Medication Access: ? Prescription drug coverage: YES Payor: [employer] EMPLOYEE / Plan: [place] [employer] PPO / Product Type: *No Product type* / .  Summary of Benefit: Weight management agents not covered under any circumstances  Assessment and Plan:   1. Medication Access Reviewed [facility] Employee plan with patient in detail. Unfortunately, this plan refused to pay for any weight medications regardless of other indications/risk factors.  Reviewed that coverage can change at any time year to year though no recent updates regarding plan for GLP1 coverage for weight loss for 2026.  Also reviewed cash-pay pharmacy options including keeping an eye out for new programs in 2026 (more partnerships being announced for lower cost cash pay options, such as [facility]).  Patient has no further qustions today. Notes she is aware of [facility]'s weight loss medicaiton policy. Will reach out if any questions in the new year regarding coverage/cash programs.   Note forwarded to PCP - fyi.  Future Appointments  Date Time Provider Department Center  01/16/2025  8:00 AM Bair, Kalpana, MD [facility] [address]  08/09/2025  9:00 AM Abbey Bruckner, MD [facility] [address] FABIENE Geronimo, PharmD Clinical Pharmacist [facility] [phone]   [facility] staff, requesting pharmacist input/consult to help with GLP-1 and or GLP1/GIP medication for weight loss for morbid obesity with co-morbidities.   On employee plan - weight loss medications not  covered.

## 2024-10-03 ENCOUNTER — Encounter: Payer: Self-pay | Admitting: Pulmonary Disease

## 2024-11-01 ENCOUNTER — Other Ambulatory Visit: Payer: Self-pay

## 2024-12-17 ENCOUNTER — Encounter

## 2024-12-27 ENCOUNTER — Ambulatory Visit: Admitting: Pulmonary Disease

## 2024-12-27 ENCOUNTER — Other Ambulatory Visit: Payer: Self-pay

## 2024-12-27 ENCOUNTER — Ambulatory Visit: Payer: Self-pay

## 2024-12-27 ENCOUNTER — Encounter: Payer: Self-pay | Admitting: Pulmonary Disease

## 2024-12-27 VITALS — BP 110/68 | HR 84 | Ht 61.0 in | Wt 221.2 lb

## 2024-12-27 DIAGNOSIS — R053 Chronic cough: Secondary | ICD-10-CM | POA: Diagnosis not present

## 2024-12-27 DIAGNOSIS — J45909 Unspecified asthma, uncomplicated: Secondary | ICD-10-CM

## 2024-12-27 DIAGNOSIS — K219 Gastro-esophageal reflux disease without esophagitis: Secondary | ICD-10-CM | POA: Diagnosis not present

## 2024-12-27 DIAGNOSIS — J45991 Cough variant asthma: Secondary | ICD-10-CM

## 2024-12-27 MED ORDER — TRELEGY ELLIPTA 200-62.5-25 MCG/ACT IN AEPB
1.0000 | INHALATION_SPRAY | Freq: Every day | RESPIRATORY_TRACT | 11 refills | Status: AC
  Filled 2024-12-27: qty 60, 30d supply, fill #0

## 2024-12-27 NOTE — Telephone Encounter (Signed)
 Attempted to contact patient x 1 to discuss concerns; patient answered the call but is requesting that she call back due to being busy.  Will place in call backs to follow-up.         Copied from CRM [id number]. Topic: Clinical - Prescription Issue >> Dec 27, 2024 12:41 PM China J wrote: Reason for CRM: The patient is having issues with the amount of levothyroxine  (SYNTHROID ) 125 MCG tablet she has been taking. She has been experiencing weight gain, and fatigue. Ultimately she is not feeling like her usual self and she thinks that her thyroids are not reacting well to the change of . She would like to speak with Dr. Abbey about what they can try next to fix this since she went from 175 MCG to 125 MCG.  Please call patient at [phone].

## 2024-12-27 NOTE — Progress Notes (Unsigned)
 "  Subjective:    Patient ID: Barbara Blake, female    DOB: Apr 12, 1956, 69 y.o.   MRN: [id number]  Patient Care Team: Bair, Kalpana, MD as PCP - General (Family Medicine)  Chief Complaint  Patient presents with   Follow-up    BACKGROUND/INTERVAL:Patient is a 69 year old lifelong never smoker who presents for follow-up of a chronic cough present for a number of years. Patient is also noted to have multiple pulmonary nodules deemed benign by prior imaging.  She was initially evaluated on 14 August 2024.  I last saw her on 28 September 2024.  HPI Discussed the use of AI scribe software for clinical note transcription with the patient, who gave verbal consent to proceed.  History of Present Illness   Barbara Blake is a 69 year old female with chronic cough who presents for follow-up.  She is accompanied by her husband [name].  She has a persistent chronic cough, primarily experienced at work due to exposure to chemicals in an area with poor ventilation. The cough is less severe than before, and her manager tries to accommodate her by not assigning her to that area frequently. She describes the cough as a 'tickle' in her throat and continues to use Flovent  HFA. She inquires about using throat lozenges for relief.  She has a history of asthma and regularly uses an inhaler. No reflux symptoms are present, and she continues to take Nexium  daily. She recalls a previous evaluation by an ear, nose, and throat specialist years ago, where a scope was used to examine her throat.  She discusses her thyroid  issues, noting that her medication dosage has been adjusted from 175 mcg to 125 mcg of levothyroxine . She feels this adjustment has not been effective as she is experiencing weight gain, fatigue, and sleepiness. No snoring or sleep apnea symptoms are present, although she mentions 'heavy breathing' at times.  Her past medical evaluations include a CT scan in 2020 and a coronary CT in 2024. She  expresses concern about her insurance coverage for medications.       Review of Systems A 10 point review of systems was performed and it is as noted above otherwise negative.   Patient Active Problem List   Diagnosis Date Noted   Encounter for screening examination for intermediate hyperglycemia and diabetes mellitus 10/07/2023   Aortic atherosclerosis 04/14/2023   Postmenopausal estrogen deficiency 12/20/2022   Atypical squamous cell changes of undetermined significance (ASCUS) on cervical cytology with negative high risk human papilloma virus (HPV) test result 12/16/2021   Morbid obesity with BMI of 40.0-44.9, adult (HCC) 05/01/2020   Allergic rhinitis 11/23/2019   Colon polyps 11/17/2018   Annual physical exam 11/17/2018   Pulmonary nodules 06/02/2018   HLD (hyperlipidemia) 12/16/2017   Midline low back pain with bilateral sciatica 11/28/2017   Gastroesophageal reflux disease 11/28/2017   Hoarseness of voice 11/28/2017   Asthma, mild intermittent 06/30/2016   Positive ANA (antinuclear antibody) 02/20/2016   Vitamin D  deficiency 02/20/2016   Essential hypertension 11/19/2011   Hypothyroidism 11/19/2011   Anxiety 11/19/2011    Social History   Tobacco Use   Smoking status: Never   Smokeless tobacco: Never  Substance Use Topics   Alcohol use: No    Alcohol/week: 0.0 standard drinks of alcohol    Allergies[1]  Active Medications[2]  Immunization History  Administered Date(s) Administered   DT (Pediatric) 12/13/2008   INFLUENZA, HIGH DOSE SEASONAL PF 09/09/2022   Influenza Split 10/20/2011, 09/02/2014   Influenza,inj,Quad PF,6+  Mos 08/16/2019   Influenza-Unspecified 08/30/2017, 07/21/2018, 08/16/2019, 09/09/2020, 08/31/2021, 09/07/2023   PFIZER(Purple Top)SARS-COV-2 Vaccination 12/10/2019, 12/31/2019, 11/14/2020   PPD Test 11/17/2018   Pfizer Covid-19 Vaccine Bivalent Booster 59yrs & up 12/18/2021   Pfizer(Comirnaty )Fall Seasonal Vaccine 12 years and older  10/29/2022   Pneumococcal Polysaccharide-23 07/10/2010, 11/25/2017   Pneumococcal-Unspecified 08/10/2021   Tdap 01/11/2020   Zoster Recombinant(Shingrix) 10/06/2018, 02/16/2019        Objective:     Vitals:   12/27/24 1449  BP: 110/68  Pulse: 84  Height: 5' 1 (1.549 m)  Weight: 221 lb 4 oz (100.4 kg)  SpO2: 98%  BMI (Calculated): 41.83     SpO2: 98 %  GENERAL: Obese woman, no acute distress.  Fully ambulatory, no conversational dyspnea.  Constantly clearing throat. HEAD: Normocephalic, atraumatic.  EYES: Pupils equal, round, reactive to light.  No scleral icterus.  MOUTH: Dentition intact, oral mucosa moist.  No thrush. NECK: Supple. No thyromegaly. Trachea midline. No JVD.  No adenopathy. PULMONARY: Good air entry bilaterally.  No adventitious sounds. CARDIOVASCULAR: S1 and S2. Regular rate and rhythm.  No rubs, murmurs or gallops heard. ABDOMEN: Is otherwise benign. MUSCULOSKELETAL: No joint deformity, no clubbing, no edema.  NEUROLOGIC: No overt focal deficit, no gait disturbance, speech is fluent. SKIN: Intact,warm,dry. PSYCH: Mood and behavior normal.  Lab Results  Component Value Date   NITRICOXIDE 7 12/28/2024  This result suggests low (<25) Type 2 (T2) airway inflammation indicating a low likelihood of active T2-driven airway inflammation; reduced probability of response to inhaled corticosteroids.     Assessment & Plan:     ICD-10-CM   1. Asthma, cough variant  J45.991 Nitric oxide     2. Chronic cough  R05.3     3. Gastroesophageal reflux disease, unspecified whether esophagitis present  K21.9       Orders Placed This Encounter  Procedures   Nitric oxide     Meds ordered this encounter  Medications   Fluticasone -Umeclidin-Vilant (TRELEGY ELLIPTA ) 200-62.5-25 MCG/ACT AEPB    Sig: Inhale 1 puff into the lungs daily.    Dispense:  60 each    Refill:  11   Discussion:    Chronic cough secondary to asthma Chronic cough persists but is less  severe. Likely multifactorial, with asthma and occupational chemical exposure as triggers. No reflux symptoms. Previous ENT evaluation was years ago. Lung function tests are excellent. Nitric oxide  level is low at 7 ppb. Current treatment includes Flovent . Consideration of increasing inhaler strength or adding Spiriva, but Spiriva is cost-prohibitive. Trelegy Ellipta  chosen as an alternative.  - Initiated Trelegy Ellipta  100. - Consider high-resolution chest CT if no response to Trelegy. - Follow up in two months.      Advised if symptoms do not improve or worsen, to please contact office for sooner follow up or seek emergency care.    I spent 32 minutes of dedicated to the care of this patient on the date of this encounter to include pre-visit review of records, face-to-face time with the patient discussing conditions above, post visit ordering of testing, clinical documentation with the electronic health record, making appropriate referrals as documented, and communicating necessary findings to members of the patients care team.     C. Leita Sanders, MD Advanced Bronchoscopy PCCM [facility]    *This note was generated using voice recognition software/Dragon and/or AI transcription program.  Despite best efforts to proofread, errors can occur which can change the meaning. Any transcriptional errors that result from this process are unintentional  and may not be fully corrected at the time of dictation.     [1] No Known Allergies [2]  Current Meds  Medication Sig   Albuterol -Budesonide  (AIRSUPRA ) 90-80 MCG/ACT AERO Inhale 1 puff into the lungs 4 (four) times daily as needed.   amLODipine  (NORVASC ) 10 MG tablet Take 1 tablet (10 mg total) by mouth daily.   amoxicillin -clavulanate (AUGMENTIN ) 875-125 MG tablet Take 1 tablet by mouth 2 (two) times daily for 7 days.   Cyanocobalamin  (VITAMIN B-12) 1000 MCG SUBL Place 1 tablet (1,000 mcg total) under the tongue daily.    esomeprazole  (NEXIUM ) 40 MG capsule Take 1 capsule (40 mg total) by mouth daily.   Fluticasone -Umeclidin-Vilant (TRELEGY ELLIPTA ) 200-62.5-25 MCG/ACT AEPB Inhale 1 puff into the lungs daily.   hydrochlorothiazide  (HYDRODIURIL ) 25 MG tablet Take 1 tablet (25 mg total) by mouth daily.   ipratropium (ATROVENT ) 0.06 % nasal spray Place 2 sprays into both nostrils 3 (three) times daily as needed for rhinitis.   levothyroxine  (SYNTHROID ) 125 MCG tablet Take 1 tablet (125 mcg total) by mouth daily.   rosuvastatin  (CRESTOR ) 5 MG tablet Take 1 tablet (5 mg total) by mouth daily.   Vitamin D , Ergocalciferol , (DRISDOL ) 1.25 MG (50000 UNIT) CAPS capsule Take 1 capsule (50,000 Units total) by mouth every 7 (seven) days.   [DISCONTINUED] fluticasone  (FLOVENT  HFA) 110 MCG/ACT inhaler Inhale 2 puffs into the lungs 2 (two) times daily.   "

## 2024-12-27 NOTE — Patient Instructions (Signed)
 VISIT SUMMARY:  Barbara Blake, a 69 year old female, visited for a follow-up regarding her chronic cough, which is primarily triggered by exposure to chemicals at work. She also discussed her asthma management and thyroid  issues.  YOUR PLAN:  -CHRONIC COUGH SECONDARY TO ASTHMA: Your chronic cough is likely due to a combination of asthma and exposure to chemicals at work. Your lung function tests are excellent, and your nitric oxide  level is low. We have started you on Trelegy Ellipta  100 to help manage your symptoms.  We will consider a  high-resolution chest CT if your symptoms are not any better when you come back. Please follow up in two months.  INSTRUCTIONS:  Please follow up in two months for a re-evaluation.

## 2024-12-27 NOTE — Telephone Encounter (Signed)
 Spoke with patient to make her aware of Dr Graylon recommendations. Patient is scheduled for Monday January 19th at 4 pm. Orders have been placed by provider. Patient verbalized understanding and has no further questions at this time.

## 2024-12-27 NOTE — Telephone Encounter (Signed)
 FYI Only or Action Required?: Action required by provider: request for appointment.  Patient was last seen in primary care on 09/24/2024 by Abbey Bruckner, MD.  Called Nurse Triage reporting Thyroid  Problem.  Symptoms began several months ago.  Interventions attempted: Nothing.  Symptoms are: stable.  Triage Disposition: See PCP When Office is Open (Within 3 Days)  Patient/caregiver understands and will follow disposition?: Yes  Reason for Disposition  [1] Fatigue (i.e., tires easily, decreased energy) AND [2] persists > 1 week  Answer Assessment - Initial Assessment Questions Pt would like to get thyroid  checked as this is similar to how she felt before being dx and taking medication. Reports feeling fine on the and noticed she does not like the way she feels when the dosage was being switched around. No appointments with PCP within dispo, patient denies seeing another provider. Please advise. CB:  [phone]  1. DESCRIPTION: Describe how you are feeling.     Fatigue and weight gain, not feeling like self  2. ONSET: When did these symptoms begin? (e.g., hours, days, weeks, months)     May or June, but gotten worse  3. CAUSE: What do you think is causing the weakness or fatigue? (e.g., not drinking enough fluids, medical problem, trouble sleeping)      levothyroxine  (SYNTHROID ) 125 MCG, dosage reduced in May or June from 175 MCG  4. NEW MEDICINES:  Have you started on any new medicines recently? (e.g., opioid pain medicines, benzodiazepines, muscle relaxants, antidepressants, antihistamines, neuroleptics, beta blockers)     Denies  5. OTHER SYMPTOMS: Do you have any other symptoms? (e.g., chest pain, fever, cough, SOB, vomiting, diarrhea, bleeding, other areas of pain)     SOB with cough related to asthma.  Protocols used: Weakness (Generalized) and Fatigue-A-AH

## 2024-12-27 NOTE — Telephone Encounter (Signed)
 Patient can get TSH and vitamin D  level checked, please assist with lab only appointment.  Lab was already ordered on 09/24/2024.  I also recommend office visit if fatigue continues.  Luke Shade, MD

## 2024-12-28 ENCOUNTER — Encounter: Payer: Self-pay | Admitting: Pulmonary Disease

## 2024-12-28 LAB — NITRIC OXIDE: Nitric Oxide: 7

## 2024-12-31 ENCOUNTER — Other Ambulatory Visit: Payer: Self-pay

## 2024-12-31 ENCOUNTER — Other Ambulatory Visit

## 2024-12-31 ENCOUNTER — Telehealth: Payer: Self-pay

## 2024-12-31 NOTE — Telephone Encounter (Signed)
 Noted. FYI, Lab Appt cancelled.

## 2024-12-31 NOTE — Telephone Encounter (Signed)
 Copied from CRM [id number]. Topic: Appointments - Appointment Cancel/Reschedule >> Dec 31, 2024  7:45 AM Harlene ORN wrote: Patient/patient representative is calling to cancel or reschedule an appointment. Refer to attachments for appointment information.

## 2025-01-16 ENCOUNTER — Ambulatory Visit

## 2025-01-17 ENCOUNTER — Other Ambulatory Visit (HOSPITAL_COMMUNITY): Payer: Self-pay

## 2025-02-26 ENCOUNTER — Ambulatory Visit: Admitting: Pulmonary Disease

## 2025-08-08 ENCOUNTER — Encounter

## 2025-08-09 ENCOUNTER — Encounter
